# Patient Record
Sex: Female | Born: 1990 | Race: Black or African American | Hispanic: No | Marital: Single | Smoking: Former smoker
Health system: Southern US, Community
[De-identification: ages and names within clinical notes are randomized; demographics above are authoritative.]

## PROBLEM LIST (undated history)

## (undated) ENCOUNTER — Ambulatory Visit: Admission: EM | Payer: Medicaid Other | Source: Home / Self Care

## (undated) ENCOUNTER — Inpatient Hospital Stay (HOSPITAL_COMMUNITY): Payer: Self-pay

## (undated) DIAGNOSIS — R87619 Unspecified abnormal cytological findings in specimens from cervix uteri: Secondary | ICD-10-CM

## (undated) DIAGNOSIS — R7989 Other specified abnormal findings of blood chemistry: Secondary | ICD-10-CM

## (undated) DIAGNOSIS — F53 Postpartum depression: Secondary | ICD-10-CM

## (undated) DIAGNOSIS — D649 Anemia, unspecified: Secondary | ICD-10-CM

## (undated) DIAGNOSIS — Z8619 Personal history of other infectious and parasitic diseases: Secondary | ICD-10-CM

## (undated) DIAGNOSIS — T7840XA Allergy, unspecified, initial encounter: Secondary | ICD-10-CM

## (undated) DIAGNOSIS — O99345 Other mental disorders complicating the puerperium: Secondary | ICD-10-CM

## (undated) HISTORY — DX: Postpartum depression: F53.0

## (undated) HISTORY — DX: Anemia, unspecified: D64.9

## (undated) HISTORY — DX: Personal history of other infectious and parasitic diseases: Z86.19

## (undated) HISTORY — DX: Other mental disorders complicating the puerperium: O99.345

## (undated) HISTORY — DX: Unspecified abnormal cytological findings in specimens from cervix uteri: R87.619

## (undated) HISTORY — PX: APPENDECTOMY: SHX54

## (undated) HISTORY — PX: DILATION AND CURETTAGE OF UTERUS: SHX78

## (undated) HISTORY — DX: Allergy, unspecified, initial encounter: T78.40XA

## (undated) HISTORY — PX: OTHER SURGICAL HISTORY: SHX169

---

## 2011-06-05 NOTE — L&D Delivery Note (Signed)
Delivery Note At 10:15 PM a viable female was delivered via Vaginal, Spontaneous Delivery (Presentation: Right Occiput Posterior). Mild shoulder dystocia approx 30 seconds, relieved w mcroberts   APGAR: 8, 9; weight . Pending  Placenta status: Intact, Spontaneous.  Cord: 3 vessels with the following complications: None.  Cord pH: n/a  Anesthesia: Epidural  Episiotomy: None Lacerations: None Suture Repair: n/a Est. Blood Loss (mL): 250  Mom to postpartum.  Baby to nursery-stable. Pt plans to bottle feed, desires outpatient circumcision  Plans IUD for contraception   Shaiann Mcmanamon M 04/16/2012, 10:54 PM

## 2011-07-18 ENCOUNTER — Encounter (HOSPITAL_COMMUNITY): Payer: Self-pay

## 2011-07-18 ENCOUNTER — Emergency Department (HOSPITAL_COMMUNITY)
Admission: EM | Admit: 2011-07-18 | Discharge: 2011-07-19 | Disposition: A | Discharge status: Home / Self Care | Payer: Self-pay | Attending: Emergency Medicine | Admitting: Emergency Medicine

## 2011-07-18 DIAGNOSIS — R109 Unspecified abdominal pain: Secondary | ICD-10-CM | POA: Insufficient documentation

## 2011-07-18 DIAGNOSIS — R112 Nausea with vomiting, unspecified: Secondary | ICD-10-CM | POA: Insufficient documentation

## 2011-07-18 DIAGNOSIS — K529 Noninfective gastroenteritis and colitis, unspecified: Secondary | ICD-10-CM

## 2011-07-18 DIAGNOSIS — K5289 Other specified noninfective gastroenteritis and colitis: Secondary | ICD-10-CM | POA: Insufficient documentation

## 2011-07-18 LAB — DIFFERENTIAL
Basophils Absolute: 0 10*3/uL (ref 0.0–0.1)
Basophils Relative: 0 % (ref 0–1)
Eosinophils Absolute: 0.2 10*3/uL (ref 0.0–0.7)
Eosinophils Relative: 4 % (ref 0–5)
Lymphocytes Relative: 33 % (ref 12–46)
Lymphs Abs: 1.5 10*3/uL (ref 0.7–4.0)
Monocytes Absolute: 0.5 10*3/uL (ref 0.1–1.0)
Monocytes Relative: 11 % (ref 3–12)
Neutro Abs: 2.4 10*3/uL (ref 1.7–7.7)
Neutrophils Relative %: 52 % (ref 43–77)

## 2011-07-18 LAB — URINALYSIS, ROUTINE W REFLEX MICROSCOPIC
Ketones, ur: 15 mg/dL — AB
Specific Gravity, Urine: 1.029 (ref 1.005–1.030)
pH: 6 (ref 5.0–8.0)

## 2011-07-18 LAB — CBC
HCT: 33.3 % — ABNORMAL LOW (ref 36.0–46.0)
Hemoglobin: 11.4 g/dL — ABNORMAL LOW (ref 12.0–15.0)
MCH: 34.5 pg — ABNORMAL HIGH (ref 26.0–34.0)
MCHC: 34.2 g/dL (ref 30.0–36.0)
MCV: 100.9 fL — ABNORMAL HIGH (ref 78.0–100.0)
Platelets: 217 10*3/uL (ref 150–400)
RBC: 3.3 MIL/uL — ABNORMAL LOW (ref 3.87–5.11)
RDW: 11.4 % — ABNORMAL LOW (ref 11.5–15.5)
WBC: 4.6 10*3/uL (ref 4.0–10.5)

## 2011-07-18 LAB — LIPASE, BLOOD: Lipase: 38 U/L (ref 11–59)

## 2011-07-18 LAB — COMPREHENSIVE METABOLIC PANEL
ALT: 15 U/L (ref 0–35)
AST: 19 U/L (ref 0–37)
Albumin: 3.7 g/dL (ref 3.5–5.2)
Alkaline Phosphatase: 52 U/L (ref 39–117)
BUN: 9 mg/dL (ref 6–23)
CO2: 23 mEq/L (ref 19–32)
Calcium: 8.4 mg/dL (ref 8.4–10.5)
Chloride: 106 mEq/L (ref 96–112)
Creatinine, Ser: 0.59 mg/dL (ref 0.50–1.10)
GFR calc Af Amer: >90 mL/min (ref >90)
GFR calc non Af Amer: >90 mL/min (ref >90)
Glucose, Bld: 105 mg/dL — ABNORMAL HIGH (ref 70–99)
Potassium: 3.1 mEq/L — ABNORMAL LOW (ref 3.5–5.1)
Sodium: 136 mEq/L (ref 135–145)
Total Bilirubin: 0.5 mg/dL (ref 0.3–1.2)
Total Protein: 6.4 g/dL (ref 6.0–8.3)

## 2011-07-18 MED ORDER — PROMETHAZINE HCL 25 MG PO TABS
25.0000 mg | ORAL_TABLET | Freq: Four times a day (QID) | ORAL | Status: DC | PRN
Start: 2011-07-18 — End: 2011-07-25

## 2011-07-18 MED ORDER — POTASSIUM CHLORIDE CRYS ER 20 MEQ PO TBCR
40.0000 meq | EXTENDED_RELEASE_TABLET | Freq: Once | ORAL | Status: AC
  Administered 2011-07-18: 40 meq via ORAL
  Filled 2011-07-18: qty 2

## 2011-07-18 MED ORDER — FENTANYL CITRATE 0.05 MG/ML IJ SOLN
100.0000 ug | Freq: Once | INTRAMUSCULAR | Status: AC
  Administered 2011-07-18: 100 ug via INTRAVENOUS
  Filled 2011-07-18: qty 2

## 2011-07-18 MED ORDER — MORPHINE SULFATE 4 MG/ML IJ SOLN
4.0000 mg | Freq: Once | INTRAMUSCULAR | Status: AC
  Administered 2011-07-18: 4 mg via INTRAVENOUS
  Filled 2011-07-18: qty 1

## 2011-07-18 MED ORDER — SODIUM CHLORIDE 0.9 % IV BOLUS (SEPSIS)
2000.0000 mL | Freq: Once | INTRAVENOUS | Status: AC
  Administered 2011-07-18 (×2): 1000 mL via INTRAVENOUS

## 2011-07-18 MED ORDER — ONDANSETRON HCL 4 MG/2ML IJ SOLN
4.0000 mg | Freq: Once | INTRAMUSCULAR | Status: AC
  Administered 2011-07-18: 4 mg via INTRAVENOUS
  Filled 2011-07-18: qty 2

## 2011-07-18 NOTE — ED Provider Notes (Signed)
Complains of the gastric pain onset yesterday after eating fried chicken and french fries feels much improved since treatment in the emergency department on exam alert no distress abdomen nondistended nontender  Doug Sou, MD 07/18/11 2323

## 2011-07-18 NOTE — ED Provider Notes (Signed)
History     CSN: [id number]  Arrival date & time 07/18/11  4098   First MD Initiated Contact with Patient 07/18/11 2030      Chief Complaint  Patient presents with  . Abdominal Pain    (Consider location/radiation/quality/duration/timing/severity/associated sxs/prior treatment) HPI Patient presents emergency Dept. With abdominal pain and vomiting since last night at 9pm. She reports that she was working at [employer] when she started feeling sick. She states that she has vomited about 5 times since last night, and the last time she vomited was this morning. She comes to the ED because she is still feeling nauseous and having abdominal pain. She has had a loss of appetite since getting sick last night. She has been able to tolerate small amounts of fluids today. Patient denies fevers, chills, sweats, chest pain, shortness of breath, diarrhea.  Patient has no medical conditions and has no allergies. History reviewed. No pertinent past medical history.  Past Surgical History  Procedure Date  . Dilation and curettage of uterus     No family history on file.  History  Substance Use Topics  . Smoking status: Current Everyday Smoker -- 0.0 packs/day    Types: Cigarettes  . Smokeless tobacco: Not on file  . Alcohol Use: No    OB History    Grav Para Term Preterm Abortions TAB SAB Ect Mult Living                  Review of Systems All pertinent positives and negatives reviewed in the history of present illness.  Allergies  Review of patient's allergies indicates no known allergies.  Home Medications  No current outpatient prescriptions on file.  BP 97/63  Pulse 105  Temp(Src) 98.7 F (37.1 C) (Oral)  Resp 16  Ht 5\' 2"  (1.575 m)  SpO2 97%  LMP 06/26/2011  Physical Exam  Constitutional: She is oriented to person, place, and time. She appears well-developed and well-nourished. No distress.  HENT:  Head: Normocephalic and atraumatic.  Cardiovascular: Normal rate, regular  rhythm and normal heart sounds.   Pulmonary/Chest: Breath sounds normal. No respiratory distress. She has no wheezes. She has no rales. She exhibits no tenderness.  Abdominal: Soft. Bowel sounds are normal. She exhibits no distension and no mass. There is tenderness (tenderness over LUQ to deep palpation) in the left upper quadrant. There is no rebound and no guarding (guarding in upper quadrants and epigastrum).    Neurological: She is alert and oriented to person, place, and time.  Skin: Skin is warm and dry. She is not diaphoretic.    ED Course  Procedures (including critical care time)  Labs Reviewed  URINALYSIS, ROUTINE W REFLEX MICROSCOPIC - Abnormal; Notable for the following:    Color, Urine AMBER (*) BIOCHEMICALS MAY BE AFFECTED BY COLOR   Bilirubin Urine SMALL (*)    Ketones, ur 15 (*)    All other components within normal limits  POCT PREGNANCY, URINE    The patient has eaten a half of a salad. She states that she is feeling better and feels like she can go home. The patient is advised to return here as needed. The patient most likely has a viral GI illness. She is told to follow the BRAT diet.      MDM  MDM Reviewed: nursing note, vitals and previous chart Interpretation: labs            Carlyle Dolly, PA-C 07/18/11 2352

## 2011-07-18 NOTE — Discharge Instructions (Signed)
Return here as needed. Increase your fluids slowly. Your testing was normal here today.

## 2011-07-18 NOTE — ED Notes (Signed)
Pt presented to the ER with c/o abdominal pain, back pain onset yesterday with vomiting. Pt denies any fever nor diarrhea

## 2011-07-19 NOTE — ED Provider Notes (Signed)
Medical screening examination/treatment/procedure(s) were conducted as a shared visit with non-physician practitioner(s) and myself.  I personally evaluated the patient during the encounter  Vera Furniss, MD 07/19/11 0052 

## 2011-08-20 ENCOUNTER — Emergency Department (HOSPITAL_COMMUNITY)
Admission: EM | Admit: 2011-08-20 | Discharge: 2011-08-21 | Disposition: A | Discharge status: Home / Self Care | Payer: Self-pay | Attending: Emergency Medicine | Admitting: Emergency Medicine

## 2011-08-20 ENCOUNTER — Encounter (HOSPITAL_COMMUNITY): Payer: Self-pay | Admitting: Emergency Medicine

## 2011-08-20 DIAGNOSIS — F17200 Nicotine dependence, unspecified, uncomplicated: Secondary | ICD-10-CM | POA: Insufficient documentation

## 2011-08-20 DIAGNOSIS — Z331 Pregnant state, incidental: Secondary | ICD-10-CM | POA: Insufficient documentation

## 2011-08-20 DIAGNOSIS — R040 Epistaxis: Secondary | ICD-10-CM | POA: Insufficient documentation

## 2011-08-20 NOTE — ED Notes (Signed)
Pt to ED for eval of nose bleeds; pt reports that bleeds are intermittent X 2 days; currently no active bleeding, but pt reports that last time it was bleeding was 9pm and bright red blood;

## 2011-08-20 NOTE — ED Notes (Signed)
Pt stated that she has been intermittent nose bleeds (from both nostrils) x 3 days. No trauma to nose. Blood is bright red blood with no clots. She also desires to know if she is pregnant and if this has to do with her nose bleeds. Pt has not had her menstrual cycle for a month.  Will continue to monitor.

## 2011-08-21 LAB — POCT PREGNANCY, URINE: Preg Test, Ur: POSITIVE — AB

## 2011-08-21 MED ORDER — PRENATAL RX 60-1 MG PO TABS: 1.0000 | ORAL_TABLET | Freq: Every day | ORAL | Status: DC

## 2011-08-21 MED ORDER — PHENYLEPHRINE HCL 0.5 % NA SOLN
1.0000 [drp] | Freq: Once | NASAL | Status: AC
  Administered 2011-08-21: 1 [drp] via NASAL
  Filled 2011-08-21 (×2): qty 15

## 2011-08-21 NOTE — ED Notes (Signed)
Dr. Dierdre Highman made aware that pt is pregnant. Called pharmacy to make sure nasal solution is safe for fetus and they stated that it is. Will continue to monitor.

## 2011-08-21 NOTE — Discharge Instructions (Signed)
ABCs of Pregnancy A Antepartum care is very important. Be sure you see your doctor and get prenatal care as soon as you think you are pregnant. At this time, you will be tested for infection, genetic abnormalities and potential problems with you and the pregnancy. This is the time to discuss diet, exercise, work, medications, labor, pain medication during labor and the possibility of a cesarean delivery. Ask any questions that may concern you. It is important to see your doctor regularly throughout your pregnancy. Avoid exposure to toxic substances and chemicals - such as cleaning solvents, lead and mercury, some insecticides, and paint. Pregnant women should avoid exposure to paint fumes, and fumes that cause you to feel ill, dizzy or faint. When possible, it is a good idea to have a pre-pregnancy consultation with your caregiver to begin some important recommendations your caregiver suggests such as, taking folic acid, exercising, quitting smoking, avoiding alcoholic beverages, etc. B Breastfeeding is the healthiest choice for both you and your baby. It has many nutritional benefits for the baby and health benefits for the mother. It also creates a very tight and loving bond between the baby and mother. Talk to your doctor, your family and friends, and your employer about how you choose to feed your baby and how they can support you in your decision. Not all birth defects can be prevented, but a woman can take actions that may increase her chance of having a healthy baby. Many birth defects happen very early in pregnancy, sometimes before a woman even knows she is pregnant. Birth defects or abnormalities of any child in your or the father's family should be discussed with your caregiver. Get a good support bra as your breast size changes. Wear it especially when you exercise and when nursing.  C Celebrate the news of your pregnancy with the your spouse/father and family. Childbirth classes are helpful to  take for you and the spouse/father because it helps to understand what happens during the pregnancy, labor and delivery. Cesarean delivery should be discussed with your doctor so you are prepared for that possibility. The pros and cons of circumcision if it is a boy, should be discussed with your pediatrician. Cigarette smoking during pregnancy can result in low birth weight babies. It has been associated with infertility, miscarriages, tubal pregnancies, infant death (mortality) and poor health (morbidity) in childhood. Additionally, cigarette smoking may cause long-term learning disabilities. If you smoke, you should try to quit before getting pregnant and not smoke during the pregnancy. Secondary smoke may also harm a mother and her developing baby. It is a good idea to ask people to stop smoking around you during your pregnancy and after the baby is born. Extra calcium is necessary when you are pregnant and is found in your prenatal vitamin, in dairy products, green leafy vegetables and in calcium supplements. D A healthy diet according to your current weight and height, along with vitamins and mineral supplements should be discussed with your caregiver. Domestic abuse or violence should be made known to your doctor right away to get the situation corrected. Drink more water when you exercise to keep hydrated. Discomfort of your back and legs usually develops and progresses from the middle of the second trimester through to delivery of the baby. This is because of the enlarging baby and uterus, which may also affect your balance. Do not take illegal drugs. Illegal drugs can seriously harm the baby and you. Drink extra fluids (water is best) throughout pregnancy to help   your body keep up with the increases in your blood volume. Drink at least 6 to 8 glasses of water, fruit juice, or milk each day. A good way to know you are drinking enough fluid is when your urine looks almost like clear water or is very light  yellow.  E Eat healthy to get the nutrients you and your unborn baby need. Your meals should include the five basic food groups. Exercise (30 minutes of light to moderate exercise a day) is important and encouraged during pregnancy, if there are no medical problems or problems with the pregnancy. Exercise that causes discomfort or dizziness should be stopped and reported to your caregiver. Emotions during pregnancy can change from being ecstatic to depression and should be understood by you, your partner and your family. F Fetal screening with ultrasound, amniocentesis and monitoring during pregnancy and labor is common and sometimes necessary. Take 400 micrograms of folic acid daily both before, when possible, and during the first few months of pregnancy to reduce the risk of birth defects of the brain and spine. All women who could possibly become pregnant should take a vitamin with folic acid, every day. It is also important to eat a healthy diet with fortified foods (enriched grain products, including cereals, rice, breads, and pastas) and foods with natural sources of folate (orange juice, green leafy vegetables, beans, peanuts, broccoli, asparagus, peas, and lentils). The father should be involved with all aspects of the pregnancy including, the prenatal care, childbirth classes, labor, delivery, and postpartum time. Fathers may also have emotional concerns about being a father, financial needs, and raising a family. G Genetic testing should be done appropriately. It is important to know your family and the father's history. If there have been problems with pregnancies or birth defects in your family, report these to your doctor. Also, genetic counselors can talk with you about the information you might need in making decisions about having a family. You can call a major medical center in your area for help in finding a board-certified genetic counselor. Genetic testing and counseling should be done  before pregnancy when possible, especially if there is a history of problems in the mother's or father's family. Certain ethnic backgrounds are more at risk for genetic defects. H Get familiar with the hospital where you will be having your baby. Get to know how long it takes to get there, the labor and delivery area, and the hospital procedures. Be sure your medical insurance is accepted there. Get your home ready for the baby including, clothes, the baby's room (when possible), furniture and car seat. Hand washing is important throughout the day, especially after handling raw meat and poultry, changing the baby's diaper or using the bathroom. This can help prevent the spread of many bacteria and viruses that cause infection. Your hair may become dry and thinner, but will return to normal a few weeks after the baby is born. Heartburn is a common problem that can be treated by taking antacids recommended by your caregiver, eating smaller meals 5 or 6 times a day, not drinking liquids when eating, drinking between meals and raising the head of your bed 2 to 3 inches. I Insurance to cover you, the baby, doctor and hospital should be reviewed so that you will be prepared to pay any costs not covered by your insurance plan. If you do not have medical insurance, there are usually clinics and services available for you in your community. Take 30 milligrams of iron during   your pregnancy as prescribed by your doctor to reduce the risk of low red blood cells (anemia) later in pregnancy. All women of childbearing age should eat a diet rich in iron. J There should be a joint effort for the mother, father and any other children to adapt to the pregnancy financially, emotionally, and psychologically during the pregnancy. Join a support group for moms-to-be. Or, join a class on parenting or childbirth. Have the family participate when possible. K Know your limits. Let your caregiver know if you experience any of the  following:   Pain of any kind.   Strong cramps.   You develop a lot of weight in a short period of time (5 pounds in 3 to 5 days).   Vaginal bleeding, leaking of amniotic fluid.   Headache, vision problems.   Dizziness, fainting, shortness of breath.   Chest pain.   Fever of 102 F (38.9 C) or higher.   Gush of clear fluid from your vagina.   Painful urination.   Domestic violence.   Irregular heartbeat (palpitations).   Rapid beating of the heart (tachycardia).   Constant feeling sick to your stomach (nauseous) and vomiting.   Trouble walking, fluid retention (edema).   Muscle weakness.   If your baby has decreased activity.   Persistent diarrhea.   Abnormal vaginal discharge.   Uterine contractions at 20-minute intervals.   Back pain that travels down your leg.  L Learn and practice that what you eat and drink should be in moderation and healthy for you and your baby. Legal drugs such as alcohol and caffeine are important issues for pregnant women. There is no safe amount of alcohol a woman can drink while pregnant. Fetal alcohol syndrome, a disorder characterized by growth retardation, facial abnormalities, and central nervous system dysfunction, is caused by a woman's use of alcohol during pregnancy. Caffeine, found in tea, coffee, soft drinks and chocolate, should also be limited. Be sure to read labels when trying to cut down on caffeine during pregnancy. More than 200 foods, beverages, and over-the-counter medications contain caffeine and have a high salt content! There are coffees and teas that do not contain caffeine. M Medical conditions such as diabetes, epilepsy, and high blood pressure should be treated and kept under control before pregnancy when possible, but especially during pregnancy. Ask your caregiver about any medications that may need to be changed or adjusted during pregnancy. If you are currently taking any medications, ask your caregiver if it  is safe to take them while you are pregnant or before getting pregnant when possible. Also, be sure to discuss any herbs or vitamins you are taking. They are medicines, too! Discuss with your doctor all medications, prescribed and over-the-counter, that you are taking. During your prenatal visit, discuss the medications your doctor may give you during labor and delivery. N Never be afraid to ask your doctor or caregiver questions about your health, the progress of the pregnancy, family problems, stressful situations, and recommendation for a pediatrician, if you do not have one. It is better to take all precautions and discuss any questions or concerns you may have during your office visits. It is a good idea to write down your questions before you visit the doctor. O Over-the-counter cough and cold remedies may contain alcohol or other ingredients that should be avoided during pregnancy. Ask your caregiver about prescription, herbs or over-the-counter medications that you are taking or may consider taking while pregnant.  P Physical activity during pregnancy can   benefit both you and your baby by lessening discomfort and fatigue, providing a sense of well-being, and increasing the likelihood of early recovery after delivery. Light to moderate exercise during pregnancy strengthens the belly (abdominal) and back muscles. This helps improve posture. Practicing yoga, walking, swimming, and cycling on a stationary bicycle are usually safe exercises for pregnant women. Avoid scuba diving, exercise at high altitudes (over 3000 feet), skiing, horseback riding, contact sports, etc. Always check with your doctor before beginning any kind of exercise, especially during pregnancy and especially if you did not exercise before getting pregnant. Q Queasiness, stomach upset and morning sickness are common during pregnancy. Eating a couple of crackers or dry toast before getting out of bed. Foods that you normally love may  make you feel sick to your stomach. You may need to substitute other nutritious foods. Eating 5 or 6 small meals a day instead of 3 large ones may make you feel better. Do not drink with your meals, drink between meals. Questions that you have should be written down and asked during your prenatal visits. R Read about and make plans to baby-proof your home. There are important tips for making your home a safer environment for your baby. Review the tips and make your home safer for you and your baby. Read food labels regarding calories, salt and fat content in the food. S Saunas, hot tubs, and steam rooms should be avoided while you are pregnant. Excessive high heat may be harmful during your pregnancy. Your caregiver will screen and examine you for sexually transmitted diseases and genetic disorders during your prenatal visits. Learn the signs of labor. Sexual relations while pregnant is safe unless there is a medical or pregnancy problem and your caregiver advises against it. T Traveling long distances should be avoided especially in the third trimester of your pregnancy. If you do have to travel out of state, be sure to take a copy of your medical records and medical insurance plan with you. You should not travel long distances without seeing your doctor first. Most airlines will not allow you to travel after 36 weeks of pregnancy. Toxoplasmosis is an infection caused by a parasite that can seriously harm an unborn baby. Avoid eating undercooked meat and handling cat litter. Be sure to wear gloves when gardening. Tingling of the hands and fingers is not unusual and is due to fluid retention. This will go away after the baby is born. U Womb (uterus) size increases during the first trimester. Your kidneys will begin to function more efficiently. This may cause you to feel the need to urinate more often. You may also leak urine when sneezing, coughing or laughing. This is due to the growing uterus pressing  against your bladder, which lies directly in front of and slightly under the uterus during the first few months of pregnancy. If you experience burning along with frequency of urination or bloody urine, be sure to tell your doctor. The size of your uterus in the third trimester may cause a problem with your balance. It is advisable to maintain good posture and avoid wearing high heels during this time. An ultrasound of your baby may be necessary during your pregnancy and is safe for you and your baby. V Vaccinations are an important concern for pregnant women. Get needed vaccines before pregnancy. Center for Disease Control (www.cdc.gov) has clear guidelines for the use of vaccines during pregnancy. Review the list, be sure to discuss it with your doctor. Prenatal vitamins are helpful   and healthy for you and the baby. Do not take extra vitamins except what is recommended. Taking too much of certain vitamins can cause overdose problems. Continuous vomiting should be reported to your caregiver. Varicose veins may appear especially if there is a family history of varicose veins. They should subside after the delivery of the baby. Support hose helps if there is leg discomfort. W Being overweight or underweight during pregnancy may cause problems. Try to get within 15 pounds of your ideal weight before pregnancy. Remember, pregnancy is not a time to be dieting! Do not stop eating or start skipping meals as your weight increases. Both you and your baby need the calories and nutrition you receive from a healthy diet. Be sure to consult with your doctor about your diet. There is a formula and diet plan available depending on whether you are overweight or underweight. Your caregiver or nutritionist can help and advise you if necessary. X Avoid X-rays. If you must have dental work or diagnostic tests, tell your dentist or physician that you are pregnant so that extra care can be taken. X-rays should only be taken when  the risks of not taking them outweigh the risk of taking them. If needed, only the minimum amount of radiation should be used. When X-rays are necessary, protective lead shields should be used to cover areas of the body that are not being X-rayed. Y Your baby loves you. Breastfeeding your baby creates a loving and very close bond between the two of you. Give your baby a healthy environment to live in while you are pregnant. Infants and children require constant care and guidance. Their health and safety should be carefully watched at all times. After the baby is born, rest or take a nap when the baby is sleeping. Z Get your ZZZs. Be sure to get plenty of rest. Resting on your side as often as possible, especially on your left side is advised. It provides the best circulation to your baby and helps reduce swelling. Try taking a nap for 30 to 45 minutes in the afternoon when possible. After the baby is born rest or take a nap when the baby is sleeping. Try elevating your feet for that amount of time when possible. It helps the circulation in your legs and helps reduce swelling.  Most information courtesy of the CDC. Document Released: 05/21/2005 Document Revised: 05/10/2011 Document Reviewed: 02/02/2009 [employer] Patient Information 2012 [employer]. 

## 2011-08-21 NOTE — ED Provider Notes (Signed)
History     CSN: [id number]  Arrival date & time 08/20/11  2209   First MD Initiated Contact with Patient 08/20/11 2344      Chief Complaint  Patient presents with  . Epistaxis    (Consider location/radiation/quality/duration/timing/severity/associated sxs/prior treatment) The history is provided by the patient.   intermittent nosebleeds for the last few days. Patient denies any trauma, any foreign bodies, any drug use or any known reason for why she may getting nosebleeds. She denies any recent illness. She denies any increased use of heater at home or increased chance of dryness in her house. No history of nosebleeds. No known bleeding disorders. Patient is requesting a pregnancy test. Last menstrual period unknown. Patient is sexually active. She denies any abdominal pain or vaginal bleeding. Epistaxis is mild and has not had any bleeding since 9 PM tonight. No known aggravating factors. Bleeding stops when she pinches her nose. No nausea or vomiting. She is unable to say which side she was bleeding from. Patient does smoke tobacco.  History reviewed. No pertinent past medical history.  Past Surgical History  Procedure Date  . Dilation and curettage of uterus     History reviewed. No pertinent family history.  History  Substance Use Topics  . Smoking status: Current Everyday Smoker -- 0.5 packs/day    Types: Cigarettes  . Smokeless tobacco: Not on file  . Alcohol Use: No    OB History    Grav Para Term Preterm Abortions TAB SAB Ect Mult Living                  Review of Systems  Constitutional: Negative for fever and chills.  HENT: Positive for nosebleeds. Negative for neck pain and neck stiffness.   Eyes: Negative for pain.  Respiratory: Negative for shortness of breath.   Cardiovascular: Negative for chest pain.  Gastrointestinal: Negative for abdominal pain.  Genitourinary: Negative for dysuria.  Musculoskeletal: Negative for back pain.  Skin: Negative for  rash.  Neurological: Negative for headaches.  All other systems reviewed and are negative.    Allergies  Review of patient's allergies indicates no known allergies.  Home Medications  No current outpatient prescriptions on file.  BP 100/58  Pulse 102  Temp(Src) 98.8 F (37.1 C) (Oral)  Resp 17  SpO2 100%  Physical Exam  Constitutional: She is oriented to person, place, and time. She appears well-developed and well-nourished.  HENT:  Head: Normocephalic and atraumatic.  Mouth/Throat: Oropharynx is clear and moist. No oropharyngeal exudate.       Mild irritation bilateral nasal mucosa. No evidence of acute bleed site  Eyes: Conjunctivae and EOM are normal. Pupils are equal, round, and reactive to light.  Neck: Trachea normal. Neck supple. No thyromegaly present.  Cardiovascular: Normal rate, regular rhythm, S1 normal, S2 normal and normal pulses.     No systolic murmur is present   No diastolic murmur is present  Pulses:      Radial pulses are 2+ on the right side, and 2+ on the left side.  Pulmonary/Chest: Effort normal and breath sounds normal. She has no wheezes. She has no rhonchi. She has no rales. She exhibits no tenderness.  Abdominal: Soft. Normal appearance and bowel sounds are normal. There is no tenderness. There is no CVA tenderness and negative Murphy's sign.  Musculoskeletal:       BLE:s Calves nontender, no cords or erythema, negative Homans sign  Neurological: She is alert and oriented to person, place, and time.  She has normal strength. No cranial nerve deficit or sensory deficit. GCS eye subscore is 4. GCS verbal subscore is 5. GCS motor subscore is 6.  Skin: Skin is warm and dry. No rash noted. She is not diaphoretic.  Psychiatric: Her speech is normal.       Cooperative and appropriate    ED Course  Procedures (including critical care time)  Labs Reviewed  POCT PREGNANCY, URINE - Abnormal; Notable for the following:    Preg Test, Ur POSITIVE (*)     All other components within normal limits   Neo-Synephrine applied bilateral layers. Recheck unchanged. No bleeding in ED   MDM   Epistaxis resolved.  Positive pregnancy test with no indication for emergent ultrasound - no symptoms of ectopic. Patient given referrals for outpatient GYN followup. Prescription for prenatal vitamins provided. Reliable historian and verbalizes understanding epistaxis and pregnancy precautions.        Sunnie Nielsen, MD 08/21/11 [phone]

## 2011-09-25 ENCOUNTER — Emergency Department (HOSPITAL_COMMUNITY)
Admission: EM | Admit: 2011-09-25 | Discharge: 2011-09-26 | Disposition: A | Discharge status: Home / Self Care | Payer: Self-pay | Attending: Emergency Medicine | Admitting: Emergency Medicine

## 2011-09-25 ENCOUNTER — Encounter (HOSPITAL_COMMUNITY): Payer: Self-pay | Admitting: *Deleted

## 2011-09-25 DIAGNOSIS — K2970 Gastritis, unspecified, without bleeding: Secondary | ICD-10-CM | POA: Insufficient documentation

## 2011-09-25 DIAGNOSIS — R109 Unspecified abdominal pain: Secondary | ICD-10-CM | POA: Insufficient documentation

## 2011-09-25 DIAGNOSIS — K2990 Gastroduodenitis, unspecified, without bleeding: Secondary | ICD-10-CM | POA: Insufficient documentation

## 2011-09-25 DIAGNOSIS — M549 Dorsalgia, unspecified: Secondary | ICD-10-CM | POA: Insufficient documentation

## 2011-09-25 DIAGNOSIS — R10819 Abdominal tenderness, unspecified site: Secondary | ICD-10-CM | POA: Insufficient documentation

## 2011-09-25 DIAGNOSIS — F17200 Nicotine dependence, unspecified, uncomplicated: Secondary | ICD-10-CM | POA: Insufficient documentation

## 2011-09-25 DIAGNOSIS — N852 Hypertrophy of uterus: Secondary | ICD-10-CM | POA: Insufficient documentation

## 2011-09-25 DIAGNOSIS — O99891 Other specified diseases and conditions complicating pregnancy: Secondary | ICD-10-CM | POA: Insufficient documentation

## 2011-09-25 NOTE — ED Notes (Signed)
Pt is 4 months pregnant.  Pt states that her due date is in November.  LMP is unknown.  Pt is here for abdominal pain in right side radiating into her left hip.  No fluid or blood from her vagina.  Pt not feeling baby yet (no change).  No fall or trauma

## 2011-09-25 NOTE — ED Notes (Signed)
Abdominal pain.  Pt found out she was pregnant on 3/18 and was told that she was 8 weeks at that time

## 2011-09-26 LAB — DIFFERENTIAL
Basophils Absolute: 0 10*3/uL (ref 0.0–0.1)
Basophils Relative: 0 % (ref 0–1)
Eosinophils Absolute: 0.2 10*3/uL (ref 0.0–0.7)
Monocytes Absolute: 0.9 10*3/uL (ref 0.1–1.0)
Monocytes Relative: 10 % (ref 3–12)

## 2011-09-26 LAB — CBC
HCT: 31.4 % — ABNORMAL LOW (ref 36.0–46.0)
Hemoglobin: 11 g/dL — ABNORMAL LOW (ref 12.0–15.0)
MCH: 35.3 pg — ABNORMAL HIGH (ref 26.0–34.0)
MCHC: 35 g/dL (ref 30.0–36.0)
RDW: 11.8 % (ref 11.5–15.5)

## 2011-09-26 LAB — WET PREP, GENITAL
Trich, Wet Prep: NONE SEEN
Yeast Wet Prep HPF POC: NONE SEEN

## 2011-09-26 LAB — COMPREHENSIVE METABOLIC PANEL
AST: 14 U/L (ref 0–37)
Albumin: 4 g/dL (ref 3.5–5.2)
BUN: 8 mg/dL (ref 6–23)
Calcium: 9.1 mg/dL (ref 8.4–10.5)
Creatinine, Ser: 0.53 mg/dL (ref 0.50–1.10)
Total Protein: 6.9 g/dL (ref 6.0–8.3)

## 2011-09-26 LAB — URINALYSIS, ROUTINE W REFLEX MICROSCOPIC
Bilirubin Urine: NEGATIVE
Ketones, ur: NEGATIVE mg/dL
Nitrite: NEGATIVE
Protein, ur: NEGATIVE mg/dL
pH: 6.5 (ref 5.0–8.0)

## 2011-09-26 LAB — URINE MICROSCOPIC-ADD ON

## 2011-09-26 LAB — LIPASE, BLOOD: Lipase: 21 U/L (ref 11–59)

## 2011-09-26 MED ORDER — MORPHINE SULFATE 4 MG/ML IJ SOLN
4.0000 mg | Freq: Once | INTRAMUSCULAR | Status: AC
  Administered 2011-09-26: 4 mg via INTRAVENOUS
  Filled 2011-09-26: qty 1

## 2011-09-26 MED ORDER — ONDANSETRON HCL 4 MG/2ML IJ SOLN
4.0000 mg | Freq: Once | INTRAMUSCULAR | Status: AC
  Administered 2011-09-26: 4 mg via INTRAVENOUS
  Filled 2011-09-26: qty 2

## 2011-09-26 MED ORDER — SODIUM CHLORIDE 0.9 % IV BOLUS (SEPSIS)
1000.0000 mL | Freq: Once | INTRAVENOUS | Status: AC
  Administered 2011-09-26: 1000 mL via INTRAVENOUS

## 2011-09-26 NOTE — ED Provider Notes (Signed)
History     CSN: [id number]  Arrival date & time 09/25/11  2301   First MD Initiated Contact with Patient 09/26/11 0032      Chief Complaint  Patient presents with  . Abdominal Pain    pregnant    HPI  History provided by the patient. Patient is a 21 year old female who is G2 P1 currently estimates being 3-4 months pregnant who presents with complaints of acute onset left abdomen and side pains yesterday afternoon around 10 AM. Pain has been constant and unchanged. Pain seems worse with lying on back and in certain positions at times. She denies any other aggravating or alleviating factors. She denies any vaginal bleeding or vaginal discharge. She denies any fever, chills, sweats. No nausea no vomiting. She denies any dysuria, hematuria urinary frequency. She does have upcoming appointment with OB/GYN she is unsure who she will be assigned to. Patient does report having an ultrasound performed at the pregnancy care Center on [facility] on April 4. She reports she was told that she had a normal pregnancy to this point.    History reviewed. No pertinent past medical history.  Past Surgical History  Procedure Date  . Dilation and curettage of uterus     No family history on file.  History  Substance Use Topics  . Smoking status: Current Everyday Smoker -- 0.5 packs/day    Types: Cigarettes  . Smokeless tobacco: Not on file  . Alcohol Use: No    OB History    Grav Para Term Preterm Abortions TAB SAB Ect Mult Living   1               Review of Systems  Constitutional: Negative for fever and chills.  Respiratory: Negative for cough and shortness of breath.   Cardiovascular: Negative for chest pain.  Gastrointestinal: Positive for abdominal pain. Negative for nausea, vomiting, diarrhea and constipation.  Genitourinary: Negative for dysuria, frequency, hematuria, flank pain, vaginal bleeding and vaginal discharge.  Musculoskeletal: Positive for back pain.  Skin:  Negative for rash.    Allergies  Review of patient's allergies indicates no known allergies.  Home Medications   Current Outpatient Rx  Name Route Sig Dispense Refill  . FLUTICASONE PROPIONATE 50 MCG/ACT NA SUSP Nasal Place 2 sprays into the nose daily. Nasal congestion    . PRENATAL RX 60-1 MG PO TABS Oral Take 1 tablet by mouth daily. 30 tablet 0    BP 104/64  Pulse 109  Temp(Src) 98.3 F (36.8 C) (Oral)  Resp 16  SpO2 100%  LMP 06/29/2011  Physical Exam  Nursing note and vitals reviewed. Constitutional: She is oriented to person, place, and time. She appears well-developed and well-nourished. No distress.  HENT:  Head: Normocephalic and atraumatic.  Cardiovascular: Normal rate and regular rhythm.   Pulmonary/Chest: Effort normal and breath sounds normal. No respiratory distress. She has no wheezes. She has no rales.  Abdominal: Soft. There is tenderness in the epigastric area and left upper quadrant. There is no rebound, no guarding, no CVA tenderness, no tenderness at McBurney's point and negative Murphy's sign.  Genitourinary: Uterus is enlarged. Uterus is not tender. Cervix exhibits no motion tenderness, no discharge and no friability. Right adnexum displays no mass, no tenderness and no fullness. Left adnexum displays no mass, no tenderness and no fullness.       Cervix closed.  Chaperone was present.  No bleeding.  Uterus enlarged consistent with early pregnancy.    Neurological: She is alert  and oriented to person, place, and time.  Skin: Skin is warm and dry. No rash noted.  Psychiatric: She has a normal mood and affect. Her behavior is normal.    ED Course  Procedures   Results for orders placed during the hospital encounter of 09/25/11  CBC      Component Value Range   WBC 8.4  4.0 - 10.5 (K/uL)   RBC 3.12 (*) 3.87 - 5.11 (MIL/uL)   Hemoglobin 11.0 (*) 12.0 - 15.0 (g/dL)   HCT 03.4 (*) 74.2 - 46.0 (%)   MCV 100.6 (*) 78.0 - 100.0 (fL)   MCH 35.3 (*) 26.0 -  34.0 (pg)   MCHC 35.0  30.0 - 36.0 (g/dL)   RDW 59.5  63.8 - 75.6 (%)   Platelets 249  150 - 400 (K/uL)  DIFFERENTIAL      Component Value Range   Neutrophils Relative 56  43 - 77 (%)   Neutro Abs 4.7  1.7 - 7.7 (K/uL)   Lymphocytes Relative 31  12 - 46 (%)   Lymphs Abs 2.6  0.7 - 4.0 (K/uL)   Monocytes Relative 10  3 - 12 (%)   Monocytes Absolute 0.9  0.1 - 1.0 (K/uL)   Eosinophils Relative 3  0 - 5 (%)   Eosinophils Absolute 0.2  0.0 - 0.7 (K/uL)   Basophils Relative 0  0 - 1 (%)   Basophils Absolute 0.0  0.0 - 0.1 (K/uL)  COMPREHENSIVE METABOLIC PANEL      Component Value Range   Sodium 133 (*) 135 - 145 (mEq/L)   Potassium 4.0  3.5 - 5.1 (mEq/L)   Chloride 102  96 - 112 (mEq/L)   CO2 21  19 - 32 (mEq/L)   Glucose, Bld 80  70 - 99 (mg/dL)   BUN 8  6 - 23 (mg/dL)   Creatinine, Ser 4.33  0.50 - 1.10 (mg/dL)   Calcium 9.1  8.4 - 29.5 (mg/dL)   Total Protein 6.9  6.0 - 8.3 (g/dL)   Albumin 4.0  3.5 - 5.2 (g/dL)   AST 14  0 - 37 (U/L)   ALT 10  0 - 35 (U/L)   Alkaline Phosphatase 39  39 - 117 (U/L)   Total Bilirubin 0.4  0.3 - 1.2 (mg/dL)   GFR calc non Af Amer >90  >90 (mL/min)   GFR calc Af Amer >90  >90 (mL/min)  LIPASE, BLOOD      Component Value Range   Lipase 21  11 - 59 (U/L)  URINALYSIS, ROUTINE W REFLEX MICROSCOPIC      Component Value Range   Color, Urine YELLOW  YELLOW    APPearance CLEAR  CLEAR    Specific Gravity, Urine 1.021  1.005 - 1.030    pH 6.5  5.0 - 8.0    Glucose, UA NEGATIVE  NEGATIVE (mg/dL)   Hgb urine dipstick NEGATIVE  NEGATIVE    Bilirubin Urine NEGATIVE  NEGATIVE    Ketones, ur NEGATIVE  NEGATIVE (mg/dL)   Protein, ur NEGATIVE  NEGATIVE (mg/dL)   Urobilinogen, UA 1.0  0.0 - 1.0 (mg/dL)   Nitrite NEGATIVE  NEGATIVE    Leukocytes, UA SMALL (*) NEGATIVE   URINE MICROSCOPIC-ADD ON      Component Value Range   Squamous Epithelial / LPF FEW (*) RARE    WBC, UA 3-6  <3 (WBC/hpf)   Bacteria, UA FEW (*) RARE         1. Abdominal pain in  pregnancy   2. Gastritis       MDM  1:25 AM patient seen and evaluated. Patient no acute distress.   3 a.m. patient feeling much better after medications. No significant pains at this time.  Pt with normal fetal heart tones.       Angus Seller, [place] 09/26/11 [phone]

## 2011-09-26 NOTE — ED Notes (Signed)
Pt c/o pain in left mid abd. Radiating into back onset approx 10pm tonight.  Fetal heart rate 156 with doppler at mid suprapubic area.

## 2011-09-26 NOTE — Discharge Instructions (Signed)
Please followup with your OB/GYN specialist as planned. Eat a plain simple diet with plenty of fluids. Continue your prenatal vitamins. Use Tylenol for pain. Return to the emergency room or women's hospital emergency room for any increased pain, fever, chills, persistent nausea vomiting.   Abdominal Pain During Pregnancy Abdominal discomfort is common in pregnancy. Most of the time, it does not cause harm. There are many causes of abdominal pain. Some causes are more serious than others. Some of the causes of abdominal pain in pregnancy are easily diagnosed. Occasionally, the diagnosis takes time to understand. Other times, the cause is not determined. Abdominal pain can be a sign that something is very wrong with the pregnancy, or the pain may have nothing to do with the pregnancy at all. For this reason, always tell your caregiver if you have any abdominal discomfort. CAUSES Common and harmless causes of abdominal pain include:  Constipation.   Excess gas and bloating.   Round ligament pain. This is pain that is felt in the folds of the groin.   The position the baby or placenta is in.   Baby kicks.   Braxton-Hicks contractions. These are mild contractions that do not cause cervical dilation.  Serious causes of abdominal pain include:  Ectopic pregnancy. This happens when a fertilized egg implants outside of the uterus.   Miscarriage.   Preterm labor. This is when labor starts at less than 37 weeks of pregnancy.   Placental abruption. This is when the placenta partially or completely separates from the uterus.   Preeclampsia. This is often associated with high blood pressure and has been referred to as "toxemia in pregnancy."   Uterine or amniotic fluid infections.  Causes unrelated to pregnancy include:  Urinary tract infection.   Gallbladder stones or inflammation.   Hepatitis or other liver illness.   Intestinal problems, stomach flu, food poisoning, or ulcer.    Appendicitis.   Kidney (renal) stones.   Kidney infection (pylonephritis).  HOME CARE INSTRUCTIONS  For mild pain:  Do not have sexual intercourse or put anything in your vagina until your symptoms go away completely.   Get plenty of rest until your pain improves. If your pain does not improve in 1 hour, call your caregiver.   Drink clear fluids if you feel nauseous. Avoid solid food as long as you are uncomfortable or nauseous.   Only take medicine as directed by your caregiver.   Keep all follow-up appointments with your caregiver.  SEEK IMMEDIATE MEDICAL CARE IF:  You are bleeding, leaking fluid, or passing tissue from the vagina.   You have increasing pain or cramping.   You have persistent vomiting.   You have painful or bloody urination.   You have a fever.   You notice a decrease in your baby's movements.   You have extreme weakness or feel faint.   You have shortness of breath, with or without abdominal pain.   You develop a severe headache with abdominal pain.   You have abnormal vaginal discharge with abdominal pain.   You have persistent diarrhea.   You have abdominal pain that continues even after rest, or gets worse.  MAKE SURE YOU:   Understand these instructions.   Will watch your condition.   Will get help right away if you are not doing well or get worse.  Document Released: 05/21/2005 Document Revised: 05/10/2011 Document Reviewed: 12/15/2010 [facility] Patient Information 2012 [place], [place].    Gastritis Gastritis is an inflammation (the body's way of  reacting to injury and/or infection) of the stomach. It is often caused by viral or bacterial (germ) infections. It can also be caused by chemicals (including alcohol) and medications. This illness may be associated with generalized malaise (feeling tired, not well), cramps, and fever. The illness may last 2 to 7 days. If symptoms of gastritis continue, gastroscopy (looking into the stomach  with a telescope-like instrument), biopsy (taking tissue samples), and/or blood tests may be necessary to determine the cause. Antibiotics will not affect the illness unless there is a bacterial infection present. One common bacterial cause of gastritis is an organism known as H. Pylori. This can be treated with antibiotics. Other forms of gastritis are caused by too much acid in the stomach. They can be treated with medications such as H2 blockers and antacids. Home treatment is usually all that is needed. Young children will quickly become dehydrated (loss of body fluids) if vomiting and diarrhea are both present. Medications may be given to control nausea. Medications are usually not given for diarrhea unless especially bothersome. Some medications slow the removal of the virus from the gastrointestinal tract. This slows down the healing process. HOME CARE INSTRUCTIONS Home care instructions for nausea and vomiting:  For adults: drink small amounts of fluids often. Drink at least 2 quarts a day. Take sips frequently. Do not drink large amounts of fluid at one time. This may worsen the nausea.   Only take over-the-counter or prescription medicines for pain, discomfort, or fever as directed by your caregiver.   Drink clear liquids only. Those are anything you can see through such as water, broth, or soft drinks.   Once you are keeping clear liquids down, you may start full liquids, soups, juices, and ice cream or sherbet. Slowly add bland (plain, not spicy) foods to your diet.  Home care instructions for diarrhea:  Diarrhea can be caused by bacterial infections or a virus. Your condition should improve with time, rest, fluids, and/or anti-diarrheal medication.   Until your diarrhea is under control, you should drink clear liquids often in small amounts. Clear liquids include: water, broth, jell-o water and weak tea.  Avoid:  Milk.   Fruits.   Tobacco.   Alcohol.   Extremely hot or cold  fluids.   Too much intake of anything at one time.  When your diarrhea stops you may add the following foods, which help the stool to become more formed:  Rice.   Bananas.   Apples without skin.   Dry toast.  Once these foods are tolerated you may add low-fat yogurt and low-fat cottage cheese. They will help to restore the normal bacterial balance in your bowel. Wash your hands well to avoid spreading bacteria (germ) or virus. SEEK IMMEDIATE MEDICAL CARE IF:   You are unable to keep fluids down.   Vomiting or diarrhea become persistent (constant).   Abdominal pain develops, increases, or localizes. (Right sided pain can be appendicitis. Left sided pain in adults can be diverticulitis.)   You develop a fever (an oral temperature above 102 F (38.9 C)).   Diarrhea becomes excessive or contains blood or mucus.   You have excessive weakness, dizziness, fainting or extreme thirst.   You are not improving or you are getting worse.   You have any other questions or concerns.  Document Released: 05/15/2001 Document Revised: 05/10/2011 Document Reviewed: 05/21/2005 [facility] Patient Information 2012 [place], [place].    RESOURCE GUIDE  Dental Problems  Patients with Medicaid: [facility]  [facility] [address]                                           [phone] [address] [employer] [address] Phone:  [phone]                                                  Phone:  [phone]  If unable to pay or uninsured, contact:  [facility] or [facility]. to become qualified for the adult dental clinic.  Chronic Pain Problems Contact [facility]  [phone] Patients need to be referred by their primary care doctor.  Insufficient Money for Medicine Contact United Way:  call "211" or [facility] [phone].  No Primary Care Doctor Call [facility]  [phone] Other agencies that provide inexpensive medical care     [facility]  [phone]    [facility]  [phone]    [facility]  [phone]    [facility]  [phone]    [facility]  [phone]    [facility]  [phone]  Psychological Services [facility] Behavioral Health  [phone] [facility] Services  [phone] [facility]   [phone] (emergency services (339)032-7296)  Substance Abuse Resources [facility]  7263240833 [facility] 605-644-8776 [facility] [phone])[phone] Floydene Flock 8161524371 Residential & Outpatient Substance Abuse Program  ([phone]  Abuse/Neglect [facility] Child Abuse Hotline 559-041-6189 [facility] Child Abuse Hotline [phone] (After Hours)  Emergency Shelter [facility] [phone]  Maternity Homes [facility] [phone])[phone] [facility] (727)[phone]  MRSA Hotline #:   9498519773    [facility] Resources  [facility]     United [employer]                          [facility] Dept. [address] [place]                       [address]      [address] [place] [address]  Blondell Reveal Phone:  [phone]                                   Phone:  [phone]                 Phone:  [phone]  [facility] Mental Health Phone:  [phone]  [facility] Child Abuse Hotline 614-793-1368 [phone] (After Hours)

## 2011-09-26 NOTE — ED Provider Notes (Signed)
Medical screening examination/treatment/procedure(s) were performed by non-physician practitioner and as supervising physician I was immediately available for consultation/collaboration.   Domingue Coltrain D Kylei Purington, MD 09/26/11 0558 

## 2011-09-26 NOTE — ED Notes (Signed)
Pt moved to [id number]

## 2011-11-21 ENCOUNTER — Inpatient Hospital Stay (HOSPITAL_COMMUNITY)
Admission: AD | Admit: 2011-11-21 | Discharge: 2011-11-22 | Disposition: A | Discharge status: Home / Self Care | Payer: Medicaid Other | Source: Ambulatory Visit | Attending: Obstetrics & Gynecology | Admitting: Obstetrics & Gynecology

## 2011-11-21 ENCOUNTER — Encounter (HOSPITAL_COMMUNITY): Payer: Self-pay | Admitting: *Deleted

## 2011-11-21 DIAGNOSIS — O99891 Other specified diseases and conditions complicating pregnancy: Secondary | ICD-10-CM | POA: Insufficient documentation

## 2011-11-21 DIAGNOSIS — M25559 Pain in unspecified hip: Secondary | ICD-10-CM | POA: Insufficient documentation

## 2011-11-21 DIAGNOSIS — R42 Dizziness and giddiness: Secondary | ICD-10-CM | POA: Insufficient documentation

## 2011-11-21 LAB — URINALYSIS, ROUTINE W REFLEX MICROSCOPIC
Glucose, UA: NEGATIVE mg/dL
Leukocytes, UA: NEGATIVE
Protein, ur: NEGATIVE mg/dL
Specific Gravity, Urine: 1.015 (ref 1.005–1.030)
Urobilinogen, UA: 1 mg/dL (ref 0.0–1.0)

## 2011-11-21 NOTE — MAU Note (Signed)
Pt reports for the last week she has been having pressure in her rt hip, pressure is worse when she is standing. Denies bleeding. Also reports that 2 days ago she had dizziness when she was lying down. Has appointment at [facility] to begin care on June 29th

## 2011-11-21 NOTE — MAU Provider Note (Signed)
History     CSN: [id number]  Arrival date and time: 11/21/11 2134   First Provider Initiated Contact with Patient 11/21/11 2248      Chief Complaint  Patient presents with  . Hip Pain   HPI Patient is a 21yo woman, G3P1011, [redacted]w[redacted]d, with no significant PMH. Patient reports that 3 days ago on Sunday, she began developing a heavy pressure on her right thigh. Pain/pressure began 3 days ago with no relief after tylenol. She does stand approx. 8 hours a day with few breaks. The pain/pressure is limited to her right hip and covers a region about 5cm x 5cm with no radiation of pain. The pain began as 10/10 on Sunday and currently in MAU she reports the pain as an 8/10. The pressure is alleviated when she lays flat and is aggravated by standing or walking on it. She has tried tylenol for the pain without relief. She does note an episode of dizziness on Monday with nausea but no vomiting and reports that she has not experienced this pressure or dizziness before. She denies any decrease in appetite or poor PO intake lately. She is establishing OB care at [facility] with her next appointment on 12/01/11.  OB History    Grav Para Term Preterm Abortions TAB SAB Ect Mult Living   3 1 1  1  1   1       Past Medical History  Diagnosis Date  . No pertinent past medical history     Past Surgical History  Procedure Date  . Dilation and curettage of uterus 2008    Family History  Problem Relation Age of Onset  . Other Neg Hx     History  Substance Use Topics  . Smoking status: Former Smoker -- 0.5 packs/day    Types: Cigarettes    Quit date: 08/21/2011  . Smokeless tobacco: Not on file  . Alcohol Use: No    Allergies: No Known Allergies  Prescriptions prior to admission  Medication Sig Dispense Refill  . Prenatal Vit-Fe Fumarate-FA (PRENATAL MULTIVITAMIN) 60-1 MG tablet Take 1 tablet by mouth daily.  30 tablet  0  . DISCONTD: fluticasone (FLONASE) 50 MCG/ACT nasal spray Place 2 sprays into the  nose daily. Nasal congestion        Review of Systems  Constitutional: Negative.   HENT: Negative.   Eyes: Negative.   Respiratory: Negative.   Cardiovascular: Negative.   Gastrointestinal: Negative.   Genitourinary: Negative.   Musculoskeletal: Negative.   Skin: Negative.   Neurological: Positive for dizziness.  Endo/Heme/Allergies: Negative.   Psychiatric/Behavioral: Negative.    Physical Exam   Blood pressure 103/63, pulse 98, temperature 99.2 F (37.3 C), temperature source Oral, resp. rate 16, height 5\' 2"  (1.575 m), weight 53.071 kg (117 lb), last menstrual period 06/29/2011, SpO2 100.00%.  Physical Exam  Constitutional: She is oriented to person, place, and time. She appears well-developed and well-nourished.  HENT:  Head: Normocephalic.  Eyes: Conjunctivae are normal.  Neck: Normal range of motion. Neck supple.  Cardiovascular: Normal rate, regular rhythm and normal heart sounds.   Respiratory: Effort normal and breath sounds normal.  GI: Soft. Bowel sounds are normal.  Genitourinary: No bleeding around the vagina. No vaginal discharge found.  Musculoskeletal: She exhibits tenderness ( Right hip to mid right thigh).  Neurological: She is alert and oriented to person, place, and time.  Skin: Skin is warm and dry.  Cervix - not examined (pain isolated to hip)  MAU Course  Procedures  MDM Results for orders placed during the hospital encounter of 11/21/11 (from the past 24 hour(s))  URINALYSIS, ROUTINE W REFLEX MICROSCOPIC     Status: Abnormal   Collection Time   11/21/11 10:00 PM      Component Value Range   Color, Urine YELLOW  YELLOW   APPearance CLEAR  CLEAR   Specific Gravity, Urine 1.015  1.005 - 1.030   pH 7.0  5.0 - 8.0   Glucose, UA NEGATIVE  NEGATIVE mg/dL   Hgb urine dipstick NEGATIVE  NEGATIVE   Bilirubin Urine NEGATIVE  NEGATIVE   Ketones, ur 15 (*) NEGATIVE mg/dL   Protein, ur NEGATIVE  NEGATIVE mg/dL   Urobilinogen, UA 1.0  0.0 - 1.0 mg/dL    Nitrite NEGATIVE  NEGATIVE   Leukocytes, UA NEGATIVE  NEGATIVE     Assessment and Plan   Right thigh/hip pain  Recommend patient apply heating pad to site as needed  Continue tylenol PRN for pain - Rest ad lib  Dizziness - Most likely due to exertion and decreased hydration 2/2 demand of pregnancy. - will recommend her to increase fluid intake minimum 8 - 10 glasses water daily  [facility] 11/21/2011, 10:59 PM

## 2011-11-21 NOTE — Discharge Instructions (Signed)
[facility]    [facility]  [phone]     Phone: [phone]          [facility]                      [facility]  @[place]     Phone: [phone]  Phone: [phone]         [facility]     Phone: [phone]  Phone: [phone]           [facility] [place]                hone: [phone]  Phone: [phone]         [facility] [address]ise Ceo      Phone: [phone]  Phone: [phone]         [facility]                Phone: [phone]  [facility]   Phone:[phone]    [facility] [address] [place])          Phone: [phone] [facility]   Phone: [phone]   Arthralgia Your caregiver has diagnosed you as suffering from an arthralgia. Arthralgia means there is pain in a joint. This can come from many reasons including:  Bruising the joint which causes soreness (inflammation) in the joint.   Wear and tear on the joints which occur as we grow older (osteoarthritis).   Overusing the joint.   Various forms of arthritis.   Infections of the joint.  Regardless of the cause of pain in your joint, most of these different pains respond to anti-inflammatory drugs and rest. The exception to this is when a joint is infected, and these cases are treated with antibiotics, if it is a bacterial infection. HOME CARE INSTRUCTIONS   Rest the injured area for as long as directed by your caregiver. Then slowly start using the joint as directed by your caregiver and as the pain allows. Crutches as directed may be useful if the ankles, knees or hips are involved. If the knee was splinted or casted, continue use and care as directed. If an stretchy or elastic wrapping bandage has been applied today, it should be removed and re-applied every 3 to 4  hours. It should not be applied tightly, but firmly enough to keep swelling down. Watch toes and feet for swelling, bluish discoloration, coldness, numbness or excessive pain. If any of these problems (symptoms) occur, remove the ace bandage and re-apply more loosely. If these symptoms persist, contact your caregiver or return to this location.   For the first 24 hours, keep the injured extremity elevated on pillows while lying down.   Apply ice for 15 to 20 minutes to the sore joint every couple hours while awake for the first half day. Then 3 to 4 times per day for the first 48 hours. Put the ice in a plastic bag and place a towel between the bag of ice and your skin.   Wear any splinting, casting, elastic bandage applications, or slings as instructed.   Only take over-the-counter or prescription medicines for pain, discomfort, or fever as directed by your caregiver. Do not use aspirin immediately after the injury unless instructed by your physician. Aspirin can cause increased bleeding and bruising of  the tissues.   If you were given crutches, continue to use them as instructed and do not resume weight bearing on the sore joint until instructed.  Persistent pain and inability to use the sore joint as directed for more than 2 to 3 days are warning signs indicating that you should see a caregiver for a follow-up visit as soon as possible. Initially, a hairline fracture (break in bone) may not be evident on X-rays. Persistent pain and swelling indicate that further evaluation, non-weight bearing or use of the joint (use of crutches or slings as instructed), or further X-rays are indicated. X-rays may sometimes not show a small fracture until a week or 10 days later. Make a follow-up appointment with your own caregiver or one to whom we have referred you. A radiologist (specialist in reading X-rays) may read your X-rays. Make sure you know how you are to obtain your X-ray results. Do not assume everything  is normal if you do not hear from [place]. SEEK MEDICAL CARE IF: Bruising, swelling, or pain increases. SEEK IMMEDIATE MEDICAL CARE IF:   Your fingers or toes are numb or blue.   The pain is not responding to medications and continues to stay the same or get worse.   The pain in your joint becomes severe.   You develop a fever over 102 F (38.9 C).   It becomes impossible to move or use the joint.  MAKE SURE YOU:   Understand these instructions.   Will watch your condition.   Will get help right away if you are not doing well or get worse.  Document Released: 05/21/2005 Document Revised: 05/10/2011 Document Reviewed: 01/07/2008 [facility] Patient Information 2012 [place], [place].

## 2011-11-21 NOTE — MAU Note (Addendum)
Pt reports pain in R hip for one week which is worse with activity. Pt also reports that she had a dizzy spell on Monday. Pt states that she needs a proof pregnancy letter.

## 2011-11-23 ENCOUNTER — Encounter (HOSPITAL_COMMUNITY): Payer: Self-pay | Admitting: *Deleted

## 2011-11-23 ENCOUNTER — Emergency Department (HOSPITAL_COMMUNITY)
Admission: EM | Admit: 2011-11-23 | Discharge: 2011-11-24 | Disposition: A | Discharge status: Home / Self Care | Payer: No Typology Code available for payment source | Attending: Emergency Medicine | Admitting: Emergency Medicine

## 2011-11-23 DIAGNOSIS — O99891 Other specified diseases and conditions complicating pregnancy: Secondary | ICD-10-CM | POA: Insufficient documentation

## 2011-11-23 DIAGNOSIS — S20219A Contusion of unspecified front wall of thorax, initial encounter: Secondary | ICD-10-CM | POA: Insufficient documentation

## 2011-11-23 DIAGNOSIS — Y998 Other external cause status: Secondary | ICD-10-CM | POA: Insufficient documentation

## 2011-11-23 DIAGNOSIS — Y93I9 Activity, other involving external motion: Secondary | ICD-10-CM | POA: Insufficient documentation

## 2011-11-23 DIAGNOSIS — Z87891 Personal history of nicotine dependence: Secondary | ICD-10-CM | POA: Insufficient documentation
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
## 2011-11-24 MED ORDER — ACETAMINOPHEN 325 MG PO TABS
650.0000 mg | ORAL_TABLET | Freq: Once | ORAL | Status: AC
  Administered 2011-11-24: 650 mg via ORAL
  Filled 2011-11-24: qty 2
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
## 2011-11-24 NOTE — ED Notes (Signed)
Fetal HR strong with good variability  145-155 bpm

## 2011-11-24 NOTE — ED Notes (Signed)
Rx x 0, pt voiced understanding to f/u with PCP and OBGYN

## 2011-12-08 ENCOUNTER — Encounter (HOSPITAL_COMMUNITY): Payer: Self-pay

## 2011-12-08 ENCOUNTER — Inpatient Hospital Stay (HOSPITAL_COMMUNITY)
Admission: AD | Admit: 2011-12-08 | Discharge: 2011-12-08 | Disposition: A | Discharge status: Home / Self Care | Payer: Medicaid Other | Source: Ambulatory Visit | Attending: Obstetrics & Gynecology | Admitting: Obstetrics & Gynecology

## 2011-12-08 DIAGNOSIS — O99891 Other specified diseases and conditions complicating pregnancy: Secondary | ICD-10-CM | POA: Insufficient documentation

## 2011-12-08 DIAGNOSIS — R109 Unspecified abdominal pain: Secondary | ICD-10-CM | POA: Insufficient documentation

## 2011-12-08 DIAGNOSIS — O26899 Other specified pregnancy related conditions, unspecified trimester: Secondary | ICD-10-CM

## 2011-12-08 LAB — URINALYSIS, ROUTINE W REFLEX MICROSCOPIC
Glucose, UA: NEGATIVE mg/dL
Nitrite: NEGATIVE

## 2011-12-08 LAB — URINE MICROSCOPIC-ADD ON

## 2011-12-08 MED ORDER — IBUPROFEN 600 MG PO TABS
600.0000 mg | ORAL_TABLET | Freq: Once | ORAL | Status: AC
  Administered 2011-12-08: 600 mg via ORAL
  Filled 2011-12-08: qty 1
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
## 2011-12-10 NOTE — MAU Provider Note (Signed)
Medical Screening exam and patient care preformed by advanced practice provider.  Agree with the above management.  

## 2012-01-16 ENCOUNTER — Ambulatory Visit (INDEPENDENT_AMBULATORY_CARE_PROVIDER_SITE_OTHER): Payer: Medicaid Other | Admitting: Obstetrics and Gynecology

## 2012-01-16 DIAGNOSIS — D573 Sickle-cell trait: Secondary | ICD-10-CM

## 2012-01-16 DIAGNOSIS — R87619 Unspecified abnormal cytological findings in specimens from cervix uteri: Secondary | ICD-10-CM

## 2012-01-16 DIAGNOSIS — Z3480 Encounter for supervision of other normal pregnancy, unspecified trimester: Secondary | ICD-10-CM

## 2012-01-16 DIAGNOSIS — O99345 Other mental disorders complicating the puerperium: Secondary | ICD-10-CM

## 2012-01-16 DIAGNOSIS — IMO0002 Reserved for concepts with insufficient information to code with codable children: Secondary | ICD-10-CM

## 2012-01-17 ENCOUNTER — Encounter: Payer: Self-pay | Admitting: Obstetrics and Gynecology

## 2012-01-17 ENCOUNTER — Ambulatory Visit (INDEPENDENT_AMBULATORY_CARE_PROVIDER_SITE_OTHER): Payer: Medicaid Other | Admitting: Obstetrics and Gynecology

## 2012-01-17 VITALS — BP 94/50 | Wt 125.0 lb

## 2012-01-17 DIAGNOSIS — Z3689 Encounter for other specified antenatal screening: Secondary | ICD-10-CM

## 2012-01-17 DIAGNOSIS — D649 Anemia, unspecified: Secondary | ICD-10-CM

## 2012-01-17 DIAGNOSIS — Z124 Encounter for screening for malignant neoplasm of cervix: Secondary | ICD-10-CM

## 2012-01-17 DIAGNOSIS — F53 Postpartum depression: Secondary | ICD-10-CM | POA: Insufficient documentation

## 2012-01-17 DIAGNOSIS — R87619 Unspecified abnormal cytological findings in specimens from cervix uteri: Secondary | ICD-10-CM | POA: Insufficient documentation

## 2012-01-17 DIAGNOSIS — Z331 Pregnant state, incidental: Secondary | ICD-10-CM

## 2012-01-17 DIAGNOSIS — Z3490 Encounter for supervision of normal pregnancy, unspecified, unspecified trimester: Secondary | ICD-10-CM

## 2012-01-17 LAB — PRENATAL PANEL VII
Antibody Screen: NEGATIVE
Basophils Absolute: 0 10*3/uL (ref 0.0–0.1)
Eosinophils Absolute: 0.2 10*3/uL (ref 0.0–0.7)
Eosinophils Relative: 2 % (ref 0–5)
HCT: 29.2 % — ABNORMAL LOW (ref 36.0–46.0)
Lymphocytes Relative: 29 % (ref 12–46)
MCH: 35.6 pg — ABNORMAL HIGH (ref 26.0–34.0)
MCHC: 33.9 g/dL (ref 30.0–36.0)
MCV: 105 fL — ABNORMAL HIGH (ref 78.0–100.0)
Monocytes Absolute: 0.9 10*3/uL (ref 0.1–1.0)
RDW: 12.4 % (ref 11.5–15.5)
Rh Type: POSITIVE
Rubella: 66.5 IU/mL
WBC: 8 10*3/uL (ref 4.0–10.5)

## 2012-01-17 LAB — POCT WET PREP (WET MOUNT)
Bacteria Wet Prep HPF POC: NEGATIVE
WBC, Wet Prep HPF POC: NEGATIVE
pH: 4

## 2012-01-17 LAB — POCT URINALYSIS DIPSTICK
Spec Grav, UA: <=1.005
Urobilinogen, UA: 1
pH, UA: 7

## 2012-01-17 LAB — GLUCOSE TOLERANCE, 1 HOUR (50G) W/O FASTING: Glucose, 1 Hour GTT: 70 mg/dL (ref 70–140)

## 2012-01-17 MED ORDER — FERRALET 90 90-1 MG PO TABS: 1.0000 | ORAL_TABLET | Freq: Once | ORAL | Status: DC

## 2012-01-17 NOTE — Patient Instructions (Addendum)
Pt to see [facility] tomorrow for NOB work up. Anatomy U/S needs to be scheduled asap. Shana up front said she would call her to sched. Alvino Chapel

## 2012-01-17 NOTE — Progress Notes (Signed)
[redacted]w[redacted]d Subjective:    Jennifer Peters is being seen today for her first obstetrical visit at [redacted]w[redacted]d gestation by LMP. Late to [facility].  She reports feeling well with mild nausea at intervals.  Her obstetrical history is significant for: Patient Active Problem List  Diagnosis  . Anemia  . Post partum depression  . Abnormal Pap smear of cervix  . Sickle cell trait    Relationship with FOB:  involved  Patient does intend to breast feed.   Pregnancy history fully reviewed SVD x 1, Term , Female, uncomplicated. Had anemia with this pregnancy. SAB 2009 at 13 wks with D&E. Uncomplicated.    Review of Systems Pertinent ROS is described in HPI   Objective:   BP 94/50  Wt 125 lb (56.7 kg)  LMP 06/29/2011 Wt Readings from Last 1 Encounters:  01/17/12 125 lb (56.7 kg)   BMI: There is no height on file to calculate BMI.  General: alert, cooperative and no distress Respiratory: clear to auscultation bilaterally Cardiovascular: regular rate and rhythm, S1, S2 normal, no murmur Breasts:  No dominant masses, nipples erect Gastrointestinal: soft, non-tender; no masses,  no organomegaly Extremities: extremities normal, no pain or edema Vaginal Bleeding: None  EXTERNAL GENITALIA: normal appearing vulva with no masses, tenderness or lesions VAGINA: no abnormal discharge or lesions CERVIX: no lesions or cervical motion tenderness; cervix closed, long, firm UTERUS: gravid and consistent with 28 weeks ADNEXA: no masses palpable and nontender OB EXAM PELVIMETRY: appears adequate   FHR:  135 bpm  Assessment:    Pregnancy at [redacted]w[redacted]d   Plan:     Prenatal panel reviewed and discussed with the patient:yes Pap smear collected:yes GC/Chlamydia collected:yes Wet prep:  PH 4.o no whiff , neg Discussion of Genetic testing options:  Offered and unsure. Prenatal vitamins recommended taking same ans ordered Ferralet 90 today due to low Hgb  Problem list reviewed and updated.  Plan of  care: Follow up in 1 weeks for anatomy scan  Earl Gala CNM, [place] 01/17/2012 6:28 PM

## 2012-01-17 NOTE — Progress Notes (Signed)
C/o:  ? Round ligament pain and decreased appetite.

## 2012-01-18 LAB — HEMOGLOBINOPATHY EVALUATION
Hemoglobin Other: 0 %
Hgb A: 97.2 % (ref 96.8–97.8)

## 2012-01-21 ENCOUNTER — Encounter: Payer: Medicaid Other | Admitting: Obstetrics and Gynecology

## 2012-01-21 ENCOUNTER — Other Ambulatory Visit: Payer: Medicaid Other

## 2012-01-21 LAB — PAP IG, CT-NG, RFX HPV ASCU

## 2012-01-23 ENCOUNTER — Encounter: Payer: Self-pay | Admitting: Obstetrics and Gynecology

## 2012-01-23 ENCOUNTER — Ambulatory Visit (INDEPENDENT_AMBULATORY_CARE_PROVIDER_SITE_OTHER): Payer: Medicaid Other

## 2012-01-23 ENCOUNTER — Telehealth: Payer: Self-pay

## 2012-01-23 ENCOUNTER — Other Ambulatory Visit: Payer: Self-pay

## 2012-01-23 ENCOUNTER — Ambulatory Visit (INDEPENDENT_AMBULATORY_CARE_PROVIDER_SITE_OTHER): Payer: Medicaid Other | Admitting: Obstetrics and Gynecology

## 2012-01-23 VITALS — BP 92/48 | Wt 125.0 lb

## 2012-01-23 DIAGNOSIS — Z3689 Encounter for other specified antenatal screening: Secondary | ICD-10-CM

## 2012-01-23 DIAGNOSIS — D649 Anemia, unspecified: Secondary | ICD-10-CM

## 2012-01-23 DIAGNOSIS — Z3480 Encounter for supervision of other normal pregnancy, unspecified trimester: Secondary | ICD-10-CM

## 2012-01-23 MED ORDER — PRENATAL MULTIVITAMIN CH: 1.0000 | ORAL_TABLET | Freq: Every day | ORAL | Status: DC

## 2012-01-23 MED ORDER — FERRALET 90 90-1 MG PO TABS: 1.0000 | ORAL_TABLET | Freq: Once | ORAL | Status: DC

## 2012-01-23 NOTE — Telephone Encounter (Signed)
Message copied by Rolla Plate on Wed Jan 23, 2012 10:39 AM ------      Message from: Jaymes Graff      Created: Tue Jan 22, 2012  1:16 PM       Pt needs to take FeSO4 325 mg BID.  Disp #90 with three refill.      Encourage pt to drink plenty of water and take stool softener to avoid constipation.      Pt also needs her b12 and folate levels checked @her  [place]

## 2012-01-23 NOTE — Progress Notes (Signed)
[place] for anatomy  EFW 2-2 <10 %  cx 3.90 cm SIUP vtx FLPK 5.3cm normal umbilical artery dopplers no reverse or absent flow.   IUGR vs improper dating.  The patient has late [facility] and no other [place] .  Will do twice weekly bpp and dopplers.  Recheck growth in two weeks.  If growth is linear change EDD to 04/23/12.  If IUGR is present consult with MD on call or the MD seeing the patient will dictate a plan
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
## 2012-01-23 NOTE — Patient Instructions (Signed)
Fetal Movement Counts Patient Name: __________________________________________________ Patient Due Date: ____________________ Kick counts is highly recommended in high risk pregnancies, but it is a good idea for every pregnant woman to do. Start counting fetal movements at 28 weeks of the pregnancy. Fetal movements increase after eating a full meal or eating or drinking something sweet (the blood sugar is higher). It is also important to drink plenty of fluids (well hydrated) before doing the count. Lie on your left side because it helps with the circulation or you can sit in a comfortable chair with your arms over your belly (abdomen) with no distractions around you. DOING THE COUNT  Try to do the count the same time of day each time you do it.   Mark the day and time, then see how long it takes for you to feel 10 movements (kicks, flutters, swishes, rolls). You should have at least 10 movements within 2 hours. You will most likely feel 10 movements in much less than 2 hours. If you do not, wait an hour and count again. After a couple of days you will see a pattern.   What you are looking for is a change in the pattern or not enough counts in 2 hours. Is it taking longer in time to reach 10 movements?  SEEK MEDICAL CARE IF:  You feel less than 10 counts in 2 hours. Tried twice.   No movement in one hour.   The pattern is changing or taking longer each day to reach 10 counts in 2 hours.   You feel the baby is not moving as it usually does.  Date: ____________ Movements: ____________ Start time: ____________ Finish time: ____________  Date: ____________ Movements: ____________ Start time: ____________ Finish time: ____________ Date: ____________ Movements: ____________ Start time: ____________ Finish time: ____________ Date: ____________ Movements: ____________ Start time: ____________ Finish time: ____________ Date: ____________ Movements: ____________ Start time: ____________ Finish time:  ____________ Date: ____________ Movements: ____________ Start time: ____________ Finish time: ____________ Date: ____________ Movements: ____________ Start time: ____________ Finish time: ____________ Date: ____________ Movements: ____________ Start time: ____________ Finish time: ____________  Date: ____________ Movements: ____________ Start time: ____________ Finish time: ____________ Date: ____________ Movements: ____________ Start time: ____________ Finish time: ____________ Date: ____________ Movements: ____________ Start time: ____________ Finish time: ____________ Date: ____________ Movements: ____________ Start time: ____________ Finish time: ____________ Date: ____________ Movements: ____________ Start time: ____________ Finish time: ____________ Date: ____________ Movements: ____________ Start time: ____________ Finish time: ____________ Date: ____________ Movements: ____________ Start time: ____________ Finish time: ____________  Date: ____________ Movements: ____________ Start time: ____________ Finish time: ____________ Date: ____________ Movements: ____________ Start time: ____________ Finish time: ____________ Date: ____________ Movements: ____________ Start time: ____________ Finish time: ____________ Date: ____________ Movements: ____________ Start time: ____________ Finish time: ____________ Date: ____________ Movements: ____________ Start time: ____________ Finish time: ____________ Date: ____________ Movements: ____________ Start time: ____________ Finish time: ____________ Date: ____________ Movements: ____________ Start time: ____________ Finish time: ____________  Date: ____________ Movements: ____________ Start time: ____________ Finish time: ____________ Date: ____________ Movements: ____________ Start time: ____________ Finish time: ____________ Date: ____________ Movements: ____________ Start time: ____________ Finish time: ____________ Date: ____________ Movements:  ____________ Start time: ____________ Finish time: ____________ Date: ____________ Movements: ____________ Start time: ____________ Finish time: ____________ Date: ____________ Movements: ____________ Start time: ____________ Finish time: ____________ Date: ____________ Movements: ____________ Start time: ____________ Finish time: ____________  Date: ____________ Movements: ____________ Start time: ____________ Finish time: ____________ Date: ____________ Movements: ____________ Start time: ____________ Finish time: ____________ Date: ____________ Movements: ____________ Start time:   ____________ Finish time: ____________ Date: ____________ Movements: ____________ Start time: ____________ Finish time: ____________ Date: ____________ Movements: ____________ Start time: ____________ Finish time: ____________ Date: ____________ Movements: ____________ Start time: ____________ Finish time: ____________ Date: ____________ Movements: ____________ Start time: ____________ Finish time: ____________  Date: ____________ Movements: ____________ Start time: ____________ Finish time: ____________ Date: ____________ Movements: ____________ Start time: ____________ Finish time: ____________ Date: ____________ Movements: ____________ Start time: ____________ Finish time: ____________ Date: ____________ Movements: ____________ Start time: ____________ Finish time: ____________ Date: ____________ Movements: ____________ Start time: ____________ Finish time: ____________ Date: ____________ Movements: ____________ Start time: ____________ Finish time: ____________ Date: ____________ Movements: ____________ Start time: ____________ Finish time: ____________  Date: ____________ Movements: ____________ Start time: ____________ Finish time: ____________ Date: ____________ Movements: ____________ Start time: ____________ Finish time: ____________ Date: ____________ Movements: ____________ Start time: ____________ Finish  time: ____________ Date: ____________ Movements: ____________ Start time: ____________ Finish time: ____________ Date: ____________ Movements: ____________ Start time: ____________ Finish time: ____________ Date: ____________ Movements: ____________ Start time: ____________ Finish time: ____________ Date: ____________ Movements: ____________ Start time: ____________ Finish time: ____________  Date: ____________ Movements: ____________ Start time: ____________ Finish time: ____________ Date: ____________ Movements: ____________ Start time: ____________ Finish time: ____________ Date: ____________ Movements: ____________ Start time: ____________ Finish time: ____________ Date: ____________ Movements: ____________ Start time: ____________ Finish time: ____________ Date: ____________ Movements: ____________ Start time: ____________ Finish time: ____________ Date: ____________ Movements: ____________ Start time: ____________ Finish time: ____________ Document Released: 06/20/2006 Document Revised: 05/10/2011 Document Reviewed: 12/21/2008 ExitCare Patient Information 2012 [employer]. 

## 2012-01-23 NOTE — Progress Notes (Signed)
Pt needs rx for iron and pnv.
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
## 2012-01-28 LAB — US OB COMP + 14 WK
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
## 2012-01-30 ENCOUNTER — Other Ambulatory Visit: Payer: Self-pay | Admitting: Obstetrics and Gynecology

## 2012-01-30 ENCOUNTER — Other Ambulatory Visit (INDEPENDENT_AMBULATORY_CARE_PROVIDER_SITE_OTHER): Payer: Medicaid Other

## 2012-01-30 ENCOUNTER — Encounter: Payer: Self-pay | Admitting: Obstetrics and Gynecology

## 2012-01-30 ENCOUNTER — Ambulatory Visit (INDEPENDENT_AMBULATORY_CARE_PROVIDER_SITE_OTHER): Payer: Medicaid Other | Admitting: Obstetrics and Gynecology

## 2012-01-30 VITALS — BP 88/50 | Wt 128.0 lb

## 2012-01-30 DIAGNOSIS — O26849 Uterine size-date discrepancy, unspecified trimester: Secondary | ICD-10-CM

## 2012-01-30 DIAGNOSIS — O0930 Supervision of pregnancy with insufficient antenatal care, unspecified trimester: Secondary | ICD-10-CM | POA: Insufficient documentation

## 2012-01-30 DIAGNOSIS — IMO0002 Reserved for concepts with insufficient information to code with codable children: Secondary | ICD-10-CM | POA: Insufficient documentation

## 2012-01-30 DIAGNOSIS — D573 Sickle-cell trait: Secondary | ICD-10-CM

## 2012-01-30 DIAGNOSIS — O4190X0 Disorder of amniotic fluid and membranes, unspecified, unspecified trimester, not applicable or unspecified: Secondary | ICD-10-CM

## 2012-01-30 DIAGNOSIS — O288 Other abnormal findings on antenatal screening of mother: Secondary | ICD-10-CM

## 2012-01-30 LAB — POCT URINALYSIS DIPSTICK
Glucose, UA: NEGATIVE
Nitrite, UA: NEGATIVE
Urobilinogen, UA: 4

## 2012-01-30 NOTE — Progress Notes (Signed)
Doing well. Has some sporadic swelling in feet and legs--no evidence of edema today, and negative Homan's sign. 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  Questions how work schedule should change as pregnancy progresses.  Reviewed no current recommendation for being OOW, but her situation would be evaluated as the pregnancy progresses.  Advised patient that pregnancy is not a disability. Reviewed labs--patient is sickle cell negative.  Chart corrected and patient informed.  [place] today for BPP/doppers: Cerebellum measures 29 weeks--US tech feels patient less likely to have IUGR, more likely to have dating that is off due to late to [place] dating/anatomy. Per [place] tech, she feels EDC is more likely 04/23/12. AFI 15, 50%ile.  Cervix 3.42. BPP 8/8, dopplers WNL  Patient will have dopplers, BPP, growth next week--per ND, if growth is linear, will establish EDC as 04/23/12 (vs. 04/04/12).     BPP  Growth NV 1 week

## 2012-01-30 NOTE — Progress Notes (Signed)
C/o edema in feet and calf muscles  Informed pt we need clean catch due to sickle cell trait. Pt states she didn't know she had sickle cell trait. Please advise.

## 2012-02-01 ENCOUNTER — Telehealth: Payer: Self-pay

## 2012-02-01 NOTE — Telephone Encounter (Signed)
spoke with pt rgd results pt states received results already

## 2012-02-06 ENCOUNTER — Other Ambulatory Visit: Payer: Self-pay | Admitting: Obstetrics and Gynecology

## 2012-02-06 ENCOUNTER — Ambulatory Visit (INDEPENDENT_AMBULATORY_CARE_PROVIDER_SITE_OTHER): Payer: Medicaid Other

## 2012-02-06 ENCOUNTER — Encounter: Payer: Self-pay | Admitting: Obstetrics and Gynecology

## 2012-02-06 ENCOUNTER — Ambulatory Visit (INDEPENDENT_AMBULATORY_CARE_PROVIDER_SITE_OTHER): Payer: Medicaid Other | Admitting: Obstetrics and Gynecology

## 2012-02-06 VITALS — BP 96/52 | Wt 129.0 lb

## 2012-02-06 DIAGNOSIS — O4190X0 Disorder of amniotic fluid and membranes, unspecified, unspecified trimester, not applicable or unspecified: Secondary | ICD-10-CM

## 2012-02-06 DIAGNOSIS — O3660X0 Maternal care for excessive fetal growth, unspecified trimester, not applicable or unspecified: Secondary | ICD-10-CM

## 2012-02-06 DIAGNOSIS — O365990 Maternal care for other known or suspected poor fetal growth, unspecified trimester, not applicable or unspecified: Secondary | ICD-10-CM

## 2012-02-06 DIAGNOSIS — IMO0002 Reserved for concepts with insufficient information to code with codable children: Secondary | ICD-10-CM

## 2012-02-06 DIAGNOSIS — O288 Other abnormal findings on antenatal screening of mother: Secondary | ICD-10-CM

## 2012-02-06 LAB — US OB FOLLOW UP

## 2012-02-06 NOTE — Progress Notes (Signed)
[redacted]w[redacted]d Ultrasound shows:  SIUP  S=D     [place] EDD: 04/25/2012           AFI: 16.88 EFW: 2 lb 9 oz           Cervical length: n/a           Placenta localization: anterior           Fetal presentation: Footling Breech                   Anatomy survey is normal Comments: Footling breech presentation, anterior placenta, AFI is normal (60th%) BPP = 8/8 Normal umbilical artery dopplers (no reversed or absent flow is seen) S/D ratio = 50th% RI = 50th % Mean for [redacted] weeks gestation (S/D ration = 2.86,RI = 0.65) Normal adnexa's

## 2012-02-06 NOTE — Progress Notes (Signed)
[redacted]w[redacted]d IUGR <3 % with normal Dopplers and AFI, BPP 8/8: OOW, increase protein, modified bed rest, plan consultation with [facility] for WIC GFM Follow-up 1 week with BPP / Dopplers

## 2012-02-07 ENCOUNTER — Telehealth: Payer: Self-pay

## 2012-02-07 NOTE — Telephone Encounter (Signed)
[facility] notified of pt information per SR.  States she will follow up with her today.  ld

## 2012-02-07 NOTE — Telephone Encounter (Signed)
Message copied by Larwance Rote on Thu Feb 07, 2012 10:05 AM ------      Message from: Silverio Lay      Created: Wed Feb 06, 2012  3:42 PM      Regarding: WIC       Please set up with Shaunda for [facility]

## 2012-02-13 ENCOUNTER — Other Ambulatory Visit: Payer: Self-pay | Admitting: Obstetrics and Gynecology

## 2012-02-13 ENCOUNTER — Encounter: Payer: Self-pay | Admitting: Obstetrics and Gynecology

## 2012-02-13 ENCOUNTER — Ambulatory Visit (INDEPENDENT_AMBULATORY_CARE_PROVIDER_SITE_OTHER): Payer: Medicaid Other | Admitting: Obstetrics and Gynecology

## 2012-02-13 ENCOUNTER — Ambulatory Visit (INDEPENDENT_AMBULATORY_CARE_PROVIDER_SITE_OTHER): Payer: Medicaid Other

## 2012-02-13 VITALS — BP 102/60 | Wt 127.0 lb

## 2012-02-13 DIAGNOSIS — IMO0002 Reserved for concepts with insufficient information to code with codable children: Secondary | ICD-10-CM

## 2012-02-13 DIAGNOSIS — O288 Other abnormal findings on antenatal screening of mother: Secondary | ICD-10-CM

## 2012-02-13 DIAGNOSIS — O365990 Maternal care for other known or suspected poor fetal growth, unspecified trimester, not applicable or unspecified: Secondary | ICD-10-CM

## 2012-02-13 NOTE — Progress Notes (Signed)
U/S today AFI 18.5cm, cx 3.1cm, BPP 8/8, nl fluid, vtx, ant placenta, nl dopplers No complaints except occas pain in mid lower abdomen, no urinary sxs and pain doesn't last long FKCs U/S for EFW, BPP and dopplers at NV in 1wk

## 2012-02-15 ENCOUNTER — Other Ambulatory Visit: Payer: Self-pay | Admitting: Obstetrics and Gynecology

## 2012-02-15 DIAGNOSIS — IMO0002 Reserved for concepts with insufficient information to code with codable children: Secondary | ICD-10-CM

## 2012-02-20 ENCOUNTER — Ambulatory Visit (INDEPENDENT_AMBULATORY_CARE_PROVIDER_SITE_OTHER): Payer: Medicaid Other

## 2012-02-20 ENCOUNTER — Encounter: Payer: Self-pay | Admitting: Obstetrics and Gynecology

## 2012-02-20 ENCOUNTER — Ambulatory Visit (INDEPENDENT_AMBULATORY_CARE_PROVIDER_SITE_OTHER): Payer: Medicaid Other | Admitting: Obstetrics and Gynecology

## 2012-02-20 VITALS — BP 98/52 | Wt 128.0 lb

## 2012-02-20 DIAGNOSIS — O365990 Maternal care for other known or suspected poor fetal growth, unspecified trimester, not applicable or unspecified: Secondary | ICD-10-CM

## 2012-02-20 DIAGNOSIS — IMO0002 Reserved for concepts with insufficient information to code with codable children: Secondary | ICD-10-CM

## 2012-02-20 NOTE — Progress Notes (Signed)
[redacted]w[redacted]d  GFM IUGR with BPP 8/8 and normal Dopplers

## 2012-02-20 NOTE — Progress Notes (Signed)
Pt stated no issues today.  

## 2012-02-20 NOTE — Progress Notes (Signed)
Sono:  EFW:  3lb 6oz +/- 5oz  AFI:  15.0cm  CX:  3.10cm   Vertex presentation  Anterior placenta.  Normal fluid:  AFI = 50th%tile  EFW = 10th%tile  1537 gr at [redacted]w[redacted]d  Umbilical dopplers normal:  S/D: 2.52  Score BPP 8/8 in 30 min  CX closed.

## 2012-02-20 NOTE — Patient Instructions (Signed)
Needs note Out of work until delivery du to pregnancy complications

## 2012-02-25 LAB — US OB FOLLOW UP

## 2012-02-27 ENCOUNTER — Ambulatory Visit (INDEPENDENT_AMBULATORY_CARE_PROVIDER_SITE_OTHER): Payer: Medicaid Other

## 2012-02-27 ENCOUNTER — Encounter: Payer: Self-pay | Admitting: Obstetrics and Gynecology

## 2012-02-27 ENCOUNTER — Ambulatory Visit (INDEPENDENT_AMBULATORY_CARE_PROVIDER_SITE_OTHER): Payer: Medicaid Other | Admitting: Obstetrics and Gynecology

## 2012-02-27 VITALS — BP 96/52 | Wt 124.0 lb

## 2012-02-27 DIAGNOSIS — IMO0002 Reserved for concepts with insufficient information to code with codable children: Secondary | ICD-10-CM

## 2012-02-27 DIAGNOSIS — O365990 Maternal care for other known or suspected poor fetal growth, unspecified trimester, not applicable or unspecified: Secondary | ICD-10-CM

## 2012-02-27 DIAGNOSIS — O418X90 Other specified disorders of amniotic fluid and membranes, unspecified trimester, not applicable or unspecified: Secondary | ICD-10-CM

## 2012-02-27 NOTE — Progress Notes (Signed)
[redacted]w[redacted]d IUGR, BPP, and AFI u/s today Anterior placenta AFI 35th% Umbilical Artery Dopplers WNL Mean for 34 wk s/d ratio = 2.08, RI=0.52

## 2012-02-27 NOTE — Progress Notes (Signed)
Doing well. [place] today:  BPP 8/8, normal dopplers.  Vtx, cervix 3.76, AFI 12.06, 35%ile. Findings reviewed. Patient understood SR to say she would be induced at 37 weeks.  I discussed that would be up to SR, and would also be based on [place] for growth, BPP, AFI, dopplers at NV.  Will refer discussion to SR at NV. GBS, cultures at NV.

## 2012-03-05 ENCOUNTER — Other Ambulatory Visit: Payer: Self-pay | Admitting: Obstetrics and Gynecology

## 2012-03-05 DIAGNOSIS — IMO0002 Reserved for concepts with insufficient information to code with codable children: Secondary | ICD-10-CM

## 2012-03-06 ENCOUNTER — Ambulatory Visit (INDEPENDENT_AMBULATORY_CARE_PROVIDER_SITE_OTHER): Payer: Medicaid Other | Admitting: Obstetrics and Gynecology

## 2012-03-06 ENCOUNTER — Ambulatory Visit (INDEPENDENT_AMBULATORY_CARE_PROVIDER_SITE_OTHER): Payer: Medicaid Other

## 2012-03-06 ENCOUNTER — Other Ambulatory Visit: Payer: Self-pay | Admitting: Obstetrics and Gynecology

## 2012-03-06 VITALS — BP 98/42 | Wt 134.0 lb

## 2012-03-06 DIAGNOSIS — O365990 Maternal care for other known or suspected poor fetal growth, unspecified trimester, not applicable or unspecified: Secondary | ICD-10-CM

## 2012-03-06 DIAGNOSIS — Z331 Pregnant state, incidental: Secondary | ICD-10-CM

## 2012-03-06 DIAGNOSIS — Z3490 Encounter for supervision of normal pregnancy, unspecified, unspecified trimester: Secondary | ICD-10-CM

## 2012-03-06 DIAGNOSIS — IMO0002 Reserved for concepts with insufficient information to code with codable children: Secondary | ICD-10-CM

## 2012-03-06 NOTE — Progress Notes (Signed)
Pt stated no issues today.  

## 2012-03-06 NOTE — Progress Notes (Signed)
[redacted]w[redacted]d Ultrasound with documented growth in 2 weeks still <10%, AFI and dopplers normal, BPP 8/8 GC/Chlamydia /GBS done Continue weekly follow-up with BPP and dopplers. Plan IOL 38-39 weeks

## 2012-03-06 NOTE — Progress Notes (Signed)
Sono:  EFW 4lb 9oz +/- 5oz  AFI 17.8 cm  BPP 8/8   Vertex presentation  Anterior placenta.  Normal fluid:  AFI = 65th%tile   EFW <10th %tile  2061 gr   BPP 8/8 in 16 minutes.   Normal umbilical dopplers:  S/D ratio = 2.45 mean for 36 weeks = 2.50  No flow reversal. No loss of diastolic flow.

## 2012-03-07 LAB — US OB FOLLOW UP

## 2012-03-07 LAB — GC/CHLAMYDIA PROBE AMP, GENITAL
Chlamydia, DNA Probe: NEGATIVE
GC Probe Amp, Genital: NEGATIVE

## 2012-03-12 ENCOUNTER — Ambulatory Visit (INDEPENDENT_AMBULATORY_CARE_PROVIDER_SITE_OTHER): Payer: Medicaid Other | Admitting: Obstetrics and Gynecology

## 2012-03-12 ENCOUNTER — Other Ambulatory Visit: Payer: Self-pay | Admitting: Obstetrics and Gynecology

## 2012-03-12 ENCOUNTER — Encounter: Payer: Self-pay | Admitting: Obstetrics and Gynecology

## 2012-03-12 ENCOUNTER — Ambulatory Visit (INDEPENDENT_AMBULATORY_CARE_PROVIDER_SITE_OTHER): Payer: Medicaid Other

## 2012-03-12 VITALS — BP 98/56 | Wt 136.0 lb

## 2012-03-12 DIAGNOSIS — O321XX0 Maternal care for breech presentation, not applicable or unspecified: Secondary | ICD-10-CM | POA: Insufficient documentation

## 2012-03-12 DIAGNOSIS — O3660X0 Maternal care for excessive fetal growth, unspecified trimester, not applicable or unspecified: Secondary | ICD-10-CM

## 2012-03-12 DIAGNOSIS — IMO0002 Reserved for concepts with insufficient information to code with codable children: Secondary | ICD-10-CM

## 2012-03-12 DIAGNOSIS — Z331 Pregnant state, incidental: Secondary | ICD-10-CM

## 2012-03-12 LAB — US OB FOLLOW UP

## 2012-03-12 NOTE — Progress Notes (Signed)
Patient ID: Nivedita Mirabella, female   DOB: 01-Jan-1991, 21 y.o.   MRN: [id number] [redacted]w[redacted]d Breech discussed risk of cord prolapse with breech presentation, exercise given, f/o with MD regarding delivery options. Reviewed s/s uc, srom, vag bleeding, daily fetal kick counts to report, comfort measures. Encouragged 8 water daily and frequent voids. Lavera Guise, CNM

## 2012-03-12 NOTE — Addendum Note (Signed)
Addended by: Darien Ramus on: 03/12/2012 04:55 PM   Modules accepted: Orders

## 2012-03-12 NOTE — Progress Notes (Signed)
[redacted]w[redacted]d Request cervix check.  GBS +  Ultrasound Comments: Footling breech presentation, anterior placenta, AFI is normal (65th%) Umbilical artery dopplers are normal (no reversed or absent flow is seen) S/D ratio and RI = 50th% Mean for 36 weeks (RI = 0.61, S/D Ratio = 2.50) BPP = 8/8 in 7 minutes Normal Adnexa's

## 2012-03-19 ENCOUNTER — Other Ambulatory Visit: Payer: Self-pay | Admitting: Obstetrics and Gynecology

## 2012-03-19 ENCOUNTER — Ambulatory Visit (INDEPENDENT_AMBULATORY_CARE_PROVIDER_SITE_OTHER): Payer: Medicaid Other

## 2012-03-19 ENCOUNTER — Ambulatory Visit (INDEPENDENT_AMBULATORY_CARE_PROVIDER_SITE_OTHER): Payer: Medicaid Other | Admitting: Obstetrics and Gynecology

## 2012-03-19 VITALS — BP 100/48 | Wt 139.0 lb

## 2012-03-19 DIAGNOSIS — IMO0002 Reserved for concepts with insufficient information to code with codable children: Secondary | ICD-10-CM

## 2012-03-19 DIAGNOSIS — O365990 Maternal care for other known or suspected poor fetal growth, unspecified trimester, not applicable or unspecified: Secondary | ICD-10-CM

## 2012-03-19 NOTE — Progress Notes (Signed)
[redacted]w[redacted]d GFM but different pt does FKC daily and easily passes IUGR with good interval growth and normal BPP/Dopplers GBS positive reviewed, booklet given Will plan IOL at 39 weeks. Follow-up 1 week with BPP and Dopplers

## 2012-03-19 NOTE — Progress Notes (Signed)
Sono: EFW 5lb 6oz +/- 6oz  < 10th%tile AFI 17.0 cm BPP 8/8 Vertex presentation Anterior plaenta Normal fluid: AFI 65th%tile EFW = <10th%tile Previous wt on 03-06-12 == 4lb 9oz Normal dopplers s/d ratio = 2.45

## 2012-03-19 NOTE — Progress Notes (Signed)
Pt stated that she doesn't feel baby move as much . Does get at least 4-6 kick counts in an hour. Pt stated no other issues today.

## 2012-03-21 ENCOUNTER — Encounter (HOSPITAL_COMMUNITY): Payer: Self-pay | Admitting: *Deleted

## 2012-03-21 ENCOUNTER — Telehealth (HOSPITAL_COMMUNITY): Payer: Self-pay | Admitting: *Deleted

## 2012-03-21 ENCOUNTER — Telehealth: Payer: Self-pay | Admitting: Obstetrics and Gynecology

## 2012-03-21 NOTE — Telephone Encounter (Signed)
Induction scheduled for 10//25/13 @ 7:30pm with AVS/CHS. -Adrianne Pridgen

## 2012-03-21 NOTE — Telephone Encounter (Signed)
Preadmission screen  

## 2012-03-25 LAB — US OB FOLLOW UP

## 2012-03-26 ENCOUNTER — Ambulatory Visit (INDEPENDENT_AMBULATORY_CARE_PROVIDER_SITE_OTHER): Payer: Medicaid Other

## 2012-03-26 ENCOUNTER — Ambulatory Visit (INDEPENDENT_AMBULATORY_CARE_PROVIDER_SITE_OTHER): Payer: Medicaid Other | Admitting: Obstetrics and Gynecology

## 2012-03-26 ENCOUNTER — Other Ambulatory Visit: Payer: Self-pay | Admitting: Obstetrics and Gynecology

## 2012-03-26 VITALS — BP 100/58 | Wt 142.0 lb

## 2012-03-26 DIAGNOSIS — IMO0002 Reserved for concepts with insufficient information to code with codable children: Secondary | ICD-10-CM

## 2012-03-26 DIAGNOSIS — O365990 Maternal care for other known or suspected poor fetal growth, unspecified trimester, not applicable or unspecified: Secondary | ICD-10-CM

## 2012-03-26 NOTE — Progress Notes (Signed)
Pt stated no issues today. Wants to discuss options without having a c/s .

## 2012-03-26 NOTE — Progress Notes (Signed)
Sono: AFI 14.1 cm BPP 8/8  Frank presentation Anterior placenta Normal fluid AFI = 60-th%tile Normal umbilical dopplers S/D ratio = 2.23  CX not measured

## 2012-03-27 NOTE — Progress Notes (Signed)
[redacted]w[redacted]d All ultrasounds reviewed: late entry to care at 28+6 weeks with uncertain LMP. Initially was given EDD:04/04/12. At 1st ultrasound: 10%         Follow-up ultrasound 1 week later showed reassuring fetal testing with cerebellum measurement indicating EDD:04/23/12         Patient was followed weekly for fetal testing and bi-weekly for growth studies which were always at the 10th percentile.         Reviewed all of these findings with patient and recommend MFM consultation 03/28/12 at 3:00 pm to clarify: 1. Best EDD                     2. Delivery recommendation                     3. If truly IUGR, can we offer trial of ECV for her breech presentation

## 2012-03-28 ENCOUNTER — Inpatient Hospital Stay (HOSPITAL_COMMUNITY): Admission: RE | Admit: 2012-03-28 | Payer: Medicaid Other | Source: Ambulatory Visit

## 2012-03-28 ENCOUNTER — Other Ambulatory Visit: Payer: Self-pay | Admitting: Obstetrics and Gynecology

## 2012-03-28 ENCOUNTER — Ambulatory Visit (HOSPITAL_COMMUNITY)
Admission: RE | Admit: 2012-03-28 | Discharge: 2012-03-28 | Disposition: A | Discharge status: Home / Self Care | Payer: Medicaid Other | Source: Ambulatory Visit | Attending: Obstetrics and Gynecology | Admitting: Obstetrics and Gynecology

## 2012-03-28 DIAGNOSIS — O2690 Pregnancy related conditions, unspecified, unspecified trimester: Secondary | ICD-10-CM

## 2012-03-28 DIAGNOSIS — O26849 Uterine size-date discrepancy, unspecified trimester: Secondary | ICD-10-CM | POA: Insufficient documentation

## 2012-03-28 DIAGNOSIS — Z3689 Encounter for other specified antenatal screening: Secondary | ICD-10-CM | POA: Insufficient documentation

## 2012-03-28 NOTE — Progress Notes (Signed)
Ms. Robideau was seen for ultrasound appointment today.  Please see [facility] report for details.

## 2012-03-30 ENCOUNTER — Inpatient Hospital Stay (HOSPITAL_COMMUNITY)
Admission: AD | Admit: 2012-03-30 | Discharge: 2012-03-30 | Disposition: A | Discharge status: Home / Self Care | Payer: Medicaid Other | Source: Ambulatory Visit | Attending: Obstetrics and Gynecology | Admitting: Obstetrics and Gynecology

## 2012-03-30 ENCOUNTER — Inpatient Hospital Stay (HOSPITAL_COMMUNITY): Payer: Medicaid Other

## 2012-03-30 DIAGNOSIS — O368190 Decreased fetal movements, unspecified trimester, not applicable or unspecified: Secondary | ICD-10-CM | POA: Insufficient documentation

## 2012-03-30 DIAGNOSIS — O365990 Maternal care for other known or suspected poor fetal growth, unspecified trimester, not applicable or unspecified: Secondary | ICD-10-CM | POA: Insufficient documentation

## 2012-03-30 DIAGNOSIS — O320XX0 Maternal care for unstable lie, not applicable or unspecified: Secondary | ICD-10-CM | POA: Diagnosis not present

## 2012-03-30 NOTE — Discharge Summary (Signed)
Seen on [facility] for triage--presented with decreased FM.  Does not require official d/c summary, since was not an inpatient. NST reactive, BPP 8/8 Home with [facility] instructions Office will call to schedule her appointment for this late week with BPP per recommendation by MFM.  Nigel Bridgeman, CNM

## 2012-03-30 NOTE — MAU Note (Signed)
Pt sent to rm [id number] for evaluation due to a full MAU unit

## 2012-03-30 NOTE — MAU Provider Note (Signed)
History   21 yo G3P1011 at 108 4/7 weeks presented unannounced c/o decreased FM tonight.  Has been evaluated at MFM due to dating discrepancy due to late to care, with concern for IUGR earlier in pregnancy--final determination of EDC per MFM recommendation is 04/23/12, now 36/4/7 weeks.  Had [place] at MFM on Friday, with EFW 5 + 12 (36%ile), AFI 20, 76%ile.  Recommendation made for weekly antenatal testing.  Patient Active Problem List  Diagnosis  . Anemia  . Post partum depression  . Abnormal Pap smear of cervix  . Insufficient prenatal care  . IUGR (intrauterine growth restriction)  . Unstable lie of fetus  No clear dx of IUGR at present, now that [facility] has been clarified.   Chief Complaint  Patient presents with  . Decreased Fetal Movement     OB History    Grav Para Term Preterm Abortions TAB SAB Ect Mult Living   3 1 1  1  1   1       Past Medical History  Diagnosis Date  . No pertinent past medical history   . Anemia   . Post partum depression     Advanced FOB age 57  . Abnormal Pap smear of cervix     unknown stage of abnormality  . Sickle cell trait     ? FH of [place] trait  . Hx of chlamydia infection     Past Surgical History  Procedure Date  . Dilation and curettage of uterus   . Wisdom tooth extracted x1 in 04/2011     Family History  Problem Relation Age of Onset  . Other Neg Hx   . Diabetes Maternal Aunt   . Kidney disease Maternal Grandmother   . Heart disease Maternal Grandmother     chf  . Hypertension Mother     History  Substance Use Topics  . Smoking status: Former Smoker -- 0.5 packs/day for 3 years    Quit date: 08/21/2011  . Smokeless tobacco: Never Used  . Alcohol Use: No    Allergies: No Known Allergies  Prescriptions prior to admission  Medication Sig Dispense Refill  . acetaminophen (TYLENOL) 500 MG tablet Take 500 mg by mouth every 6 (six) hours as needed. For pain      . Fe Cbn-Fe Gluc-FA-B12-C-DSS (FERRALET 90) 90-1 MG TABS Take 1  capsule by mouth once.  30 each  4  . Prenatal Vit-Fe Fumarate-FA (PRENATAL MULTIVITAMIN) TABS Take 1 tablet by mouth daily.  90 tablet  3     Physical Exam   Blood pressure 117/73, pulse 108, temperature 98.3 F (36.8 C), temperature source Oral, resp. rate 20, last menstrual period 06/29/2011.  Chest clear Heart RRR without murmur Abd gravid, NT Pelvic--deferred Ext WNL  FHR reactive, no decels. No UCs  ED Course  IUP at 36 4/7 weeks Decreased FM--still perceives less FM  Plan: Check BPP to complete assessment of fetal status. If WNL, plan d/c.   Nigel Bridgeman CNM, [place] 03/30/2012 11:06 PM   Addendum: Returned from [facility] = 8/8, with subjectively normal fluid. Patient reassured regarding fetal status. D/C'd home with [facility] instructions. Office will call to schedule appt late this week for ROB and BPP (per MFM recommendation).  Nigel Bridgeman, CNM

## 2012-03-30 NOTE — Discharge Instructions (Signed)
Fetal Monitoring, Fetal Movement Assessment  Fetal movement assessment (FMA) is done by the pregnant woman herself by counting and recording the baby's movements over a certain time period. It is done to see if there are problems with the pregnancy and the baby. Identifying and correcting problems may prevent serious problems from developing with the fetus, including fetal loss. Some pregnancies are complicated by the mother's medical problems. Some of these problems are type 1 diabetes mellitus, high blood pressure and other chronic medical illnesses. This is why it is important to monitor the baby before birth.   OTHER TECHNIQUES OF MONITORING YOUR BABY BEFORE BIRTH  Several tests are in use. These include:  · Nonstress test (NST). This test monitors the baby's heart rate when the baby moves.  · Contraction stress test (CST). This test monitors the baby's heart rate during a contraction of the uterus.  · Fetal biophysical profile (BPP). This measures and evaluates 5 observations of the baby:  · The nonstress test.  · The baby's breathing.  · The baby's movements.  · The baby's muscle tone.  · The amount of amniotic fluid.  · Modified BPP. This measures the volume of fluid in different parts of the amniotic sac (amniotic fluid index) and the results of the nonstress test.  · Umbilical artery doppler velocimetry. This evaluates the blood flow through the umbilical cord.  There are several very serious problems that cannot be predicted or detected with any of the fetal monitoring procedures. These problems include separation (abruption) of the placenta or when the fetus chokes on the umbilical cord (umbilical cord accident).  Your caregiver will help you understand your tests and what they mean for you and your baby. It is your responsibility to obtain the results of your test.  LET YOUR CAREGIVER KNOW ABOUT:   · Any medications you are taking including prescription and over-the-counter drugs, herbs, eye drops and  creams.  · If you have a fever.  · If you have an infection.  · If you are sick.  RISKS AND COMPLICATIONS   There are no risks or complications to the mother or fetus with FMA.  BEFORE THE PROCEDURE  · Do not take medications that may decrease or increase the baby's heart rate and/or movements.  · Eat a full meal at least 2 hours before the test.  · Do not smoke if you are pregnant. If you smoke, stop at least 2 days before the test. It is best not to smoke at all when you are pregnant.  PROCEDURE  Sometimes, a mother notices her baby moves less before there are problems. Because of this, it is believed that fetal movement checking by the mother (kick counts) is a good way to check the baby before birth.  There are different ways of doing this. Two good ways are:  · The woman lies on her side and counts distinct (individual) fetal movements. A feeling of 10 distinct movements in a period of up to 2 hours is considered reassuring. When 10 movements are felt, you may stop counting.  · Women are instructed to count fetal movements for 1 hour, three times per week. The count is good if, after one week, it equals or is over the woman's previously established baseline count. If the count is lower, further checking of your baby is needed.  AFTER THE PROCEDURE  You may resume your usual activities.  HOME CARE INSTRUCTIONS   · Follow your caregiver's advice and recommendations.  ·   Be aware of your baby's movements. Are they normal, less than usual or more than usual?  · Make and keep the rest of your prenatal appointments.  SEEK MEDICAL CARE IF:   · You develop a temperature of 100° F (37.8° C) or higher.  · You have a bloody mucus discharge from the vagina (a bloody show).  SEEK IMMEDIATE MEDICAL CARE IF:   · You do not feel the baby move.  · You think the baby's movements are too little or too many.  · You develop contractions.  · You develop vaginal bleeding.  · You have belly (abdominal) pain.  · You have leaking or a  gush of fluid from the vagina.  Document Released: 05/11/2002 Document Revised: 08/13/2011 Document Reviewed: 09/13/2008  ExitCare® Patient Information ©2013 [employer].

## 2012-03-31 ENCOUNTER — Other Ambulatory Visit: Payer: Self-pay

## 2012-03-31 ENCOUNTER — Telehealth: Payer: Self-pay | Admitting: Obstetrics and Gynecology

## 2012-03-31 ENCOUNTER — Telehealth: Payer: Self-pay

## 2012-03-31 DIAGNOSIS — O26849 Uterine size-date discrepancy, unspecified trimester: Secondary | ICD-10-CM

## 2012-03-31 NOTE — Telephone Encounter (Signed)
Pt will c/b to set up an appointment.

## 2012-03-31 NOTE — Telephone Encounter (Signed)
Message copied by Janeece Agee on Mon Mar 31, 2012  9:08 AM ------      Message from: Cornelius Moras      Created: Sun Mar 30, 2012 11:14 PM      Regarding: need ROB appt this week       This patient doesn't have ROB appt--needs one late this week, with BPP due to dating discrepancy.            Thanks!      VL

## 2012-03-31 NOTE — Telephone Encounter (Signed)
Pt was called following message left in the triage pool listed as night call. Pt left message complaining of decrease fetal movement. Pt stated that she had gone to the ER on last pm and was seen by CNM Tommie Sams, Camillia Herter

## 2012-03-31 NOTE — Telephone Encounter (Signed)
TC from pt. Pt is flexible but 04/04/12 she can only come in the am's. Appointment scheduled 04/03/12 @ 2pm for BPP u/s for dating discrepancy & ROB after u/s with VL. Pt voices understanding.

## 2012-04-02 ENCOUNTER — Other Ambulatory Visit: Payer: Self-pay

## 2012-04-02 DIAGNOSIS — IMO0002 Reserved for concepts with insufficient information to code with codable children: Secondary | ICD-10-CM

## 2012-04-03 ENCOUNTER — Encounter: Payer: Self-pay | Admitting: Obstetrics and Gynecology

## 2012-04-03 ENCOUNTER — Ambulatory Visit (INDEPENDENT_AMBULATORY_CARE_PROVIDER_SITE_OTHER): Payer: Medicaid Other | Admitting: Obstetrics and Gynecology

## 2012-04-03 ENCOUNTER — Ambulatory Visit (INDEPENDENT_AMBULATORY_CARE_PROVIDER_SITE_OTHER): Payer: Medicaid Other

## 2012-04-03 VITALS — BP 98/54 | Wt 142.0 lb

## 2012-04-03 DIAGNOSIS — IMO0002 Reserved for concepts with insufficient information to code with codable children: Secondary | ICD-10-CM

## 2012-04-03 DIAGNOSIS — O3660X0 Maternal care for excessive fetal growth, unspecified trimester, not applicable or unspecified: Secondary | ICD-10-CM

## 2012-04-03 DIAGNOSIS — O26849 Uterine size-date discrepancy, unspecified trimester: Secondary | ICD-10-CM

## 2012-04-03 DIAGNOSIS — Z23 Encounter for immunization: Secondary | ICD-10-CM

## 2012-04-03 NOTE — Progress Notes (Signed)
Doing well.   [place] today, with normal BPP and AFI. Plan weekly BPP per MFM, check growth NV due to newly established due date of 04/23/12.   No IUGR now per MFM--recommend awaiting spontaneous labor. Reviewed EDC decision and previous EFWs with patient.

## 2012-04-03 NOTE — Progress Notes (Signed)
Pt stated no issues today.pt got flu shot today/ pt wants a cervix check today.

## 2012-04-03 NOTE — Progress Notes (Signed)
IUGR and BPP u/s today Anterior placenta WNL AFI 65th%tile BPP 8/8

## 2012-04-03 NOTE — Addendum Note (Signed)
Addended by: Janeece Agee on: 04/03/2012 04:32 PM   Modules accepted: Orders

## 2012-04-08 ENCOUNTER — Ambulatory Visit (INDEPENDENT_AMBULATORY_CARE_PROVIDER_SITE_OTHER): Payer: Medicaid Other

## 2012-04-08 ENCOUNTER — Ambulatory Visit (INDEPENDENT_AMBULATORY_CARE_PROVIDER_SITE_OTHER): Payer: Medicaid Other | Admitting: Obstetrics and Gynecology

## 2012-04-08 ENCOUNTER — Encounter: Payer: Self-pay | Admitting: Obstetrics and Gynecology

## 2012-04-08 VITALS — BP 114/78 | Wt 142.0 lb

## 2012-04-08 DIAGNOSIS — Z3490 Encounter for supervision of normal pregnancy, unspecified, unspecified trimester: Secondary | ICD-10-CM

## 2012-04-08 DIAGNOSIS — O26849 Uterine size-date discrepancy, unspecified trimester: Secondary | ICD-10-CM

## 2012-04-08 DIAGNOSIS — Z331 Pregnant state, incidental: Secondary | ICD-10-CM

## 2012-04-08 NOTE — Progress Notes (Signed)
[redacted]w[redacted]d  GBS: POSITIVE   Pt c/o: pt is concerned about baby size.  U/S: IUGR, BPP/dopp  Vertex presentation. Anterior placenta. Normal fluid.  Concordant growth with an EDD of 04/23/2012. EFW = 58th%tile  Score BPP 8 of 8 in 11 minutes.  Cx not measured.

## 2012-04-08 NOTE — Progress Notes (Signed)
[redacted]w[redacted]d Dating for the patient was uncertain.  Maternal fetal medicine recommends weekly antenatal testing. ultrasound: Single gestation, vertex, normal fluid,biophysical profile 8 out of 8, size 58th percentile. Positive beta strep. Return office in one week.  Biophysical profile next visit. Dr. Stefano Gaul

## 2012-04-09 ENCOUNTER — Ambulatory Visit (INDEPENDENT_AMBULATORY_CARE_PROVIDER_SITE_OTHER): Payer: Medicaid Other | Admitting: Obstetrics and Gynecology

## 2012-04-09 ENCOUNTER — Encounter: Payer: Self-pay | Admitting: Obstetrics and Gynecology

## 2012-04-09 ENCOUNTER — Telehealth: Payer: Self-pay | Admitting: Obstetrics and Gynecology

## 2012-04-09 VITALS — BP 102/62 | Wt 142.0 lb

## 2012-04-09 DIAGNOSIS — O320XX0 Maternal care for unstable lie, not applicable or unspecified: Secondary | ICD-10-CM

## 2012-04-09 NOTE — Telephone Encounter (Signed)
Pt called, is 36 wks 6days, states has been having cramping since 0700, is feeling a lot of pressure, pt denies any bldg or LOF.  Pt says she has been drinking a lot of fluids today, does report + fm today.  Pt says she is feeling tightening off/on, but has not timed them, is unsure if she is having contractions.  Pt scheduled an appt today @ 1630 for eval w/ VL.

## 2012-04-09 NOTE — Progress Notes (Signed)
Pt stated no other issues today besides the pressure. Pt wants a cervix check today.

## 2012-04-09 NOTE — Progress Notes (Signed)
Here for increased pressure, unsure if having contractions. No leaking or bleeding.  +FM. Very unclear regarding what contractions are or feel like--"was induced last time and didn't feel anything till they told me to push", but this was with epidural placed fairly early in labor process. No contractions palpated or felt by patient during 20 minute visit.  May have had 3-4 in an hour before evaluation. S/S of labor reviewed, support offered for discomforts of 3rd trimester. Vtx is lower in pelvis than yesterday, but no cervical change. Reviewed revision of EDC to 11/20 by MFM consult. To call with any concerns or questions regarding status or sx. Will keep appt next Wednesday for BPP and visit--weekly BPP due to issue of late to care, dating, and size, but last MFM [place] on 10/26 showed EFW of 36%ile, and no indication for induction unless fetal status changes.

## 2012-04-15 LAB — US OB FOLLOW UP

## 2012-04-16 ENCOUNTER — Other Ambulatory Visit: Payer: Medicaid Other

## 2012-04-16 ENCOUNTER — Encounter (HOSPITAL_COMMUNITY): Payer: Self-pay | Admitting: Anesthesiology

## 2012-04-16 ENCOUNTER — Inpatient Hospital Stay (HOSPITAL_COMMUNITY): Payer: Medicaid Other | Admitting: Anesthesiology

## 2012-04-16 ENCOUNTER — Encounter (HOSPITAL_COMMUNITY): Payer: Self-pay | Admitting: *Deleted

## 2012-04-16 ENCOUNTER — Encounter: Payer: Medicaid Other | Admitting: Obstetrics and Gynecology

## 2012-04-16 ENCOUNTER — Inpatient Hospital Stay (HOSPITAL_COMMUNITY)
Admission: AD | Admit: 2012-04-16 | Discharge: 2012-04-18 | DRG: 775 | Disposition: A | Discharge status: Home / Self Care | Payer: Medicaid Other | Source: Ambulatory Visit | Attending: Obstetrics and Gynecology | Admitting: Obstetrics and Gynecology

## 2012-04-16 DIAGNOSIS — O0930 Supervision of pregnancy with insufficient antenatal care, unspecified trimester: Secondary | ICD-10-CM | POA: Insufficient documentation

## 2012-04-16 DIAGNOSIS — O479 False labor, unspecified: Secondary | ICD-10-CM

## 2012-04-16 DIAGNOSIS — O99892 Other specified diseases and conditions complicating childbirth: Secondary | ICD-10-CM | POA: Diagnosis present

## 2012-04-16 DIAGNOSIS — O26843 Uterine size-date discrepancy, third trimester: Secondary | ICD-10-CM | POA: Insufficient documentation

## 2012-04-16 DIAGNOSIS — Z22330 Carrier of Group B streptococcus: Secondary | ICD-10-CM

## 2012-04-16 DIAGNOSIS — O9903 Anemia complicating the puerperium: Secondary | ICD-10-CM | POA: Diagnosis not present

## 2012-04-16 DIAGNOSIS — D649 Anemia, unspecified: Secondary | ICD-10-CM | POA: Diagnosis not present

## 2012-04-16 LAB — CBC
MCV: 104.9 fL — ABNORMAL HIGH (ref 78.0–100.0)
Platelets: 219 10*3/uL (ref 150–400)
RDW: 12.5 % (ref 11.5–15.5)
WBC: 11.7 10*3/uL — ABNORMAL HIGH (ref 4.0–10.5)

## 2012-04-16 MED ORDER — ONDANSETRON HCL 4 MG/2ML IJ SOLN: 4.0000 mg | INTRAMUSCULAR | Status: DC | PRN

## 2012-04-16 MED ORDER — FENTANYL 2.5 MCG/ML BUPIVACAINE 1/10 % EPIDURAL INFUSION (WH - ANES)
14.0000 mL/h | INTRAMUSCULAR | Status: DC
  Administered 2012-04-16 (×2): 14 mL/h via EPIDURAL
  Filled 2012-04-16 (×2): qty 125

## 2012-04-16 MED ORDER — EPHEDRINE 5 MG/ML INJ
10.0000 mg | INTRAVENOUS | Status: DC | PRN
  Filled 2012-04-16: qty 4

## 2012-04-16 MED ORDER — FENTANYL CITRATE 0.05 MG/ML IJ SOLN: 100.0000 ug | Freq: Once | INTRAMUSCULAR | Status: DC

## 2012-04-16 MED ORDER — OXYCODONE-ACETAMINOPHEN 5-325 MG PO TABS: 1.0000 | ORAL_TABLET | ORAL | Status: DC | PRN

## 2012-04-16 MED ORDER — LANOLIN HYDROUS EX OINT: TOPICAL_OINTMENT | CUTANEOUS | Status: DC | PRN

## 2012-04-16 MED ORDER — MEASLES, MUMPS & RUBELLA VAC ~~LOC~~ INJ
0.5000 mL | INJECTION | Freq: Once | SUBCUTANEOUS | Status: DC
  Filled 2012-04-16: qty 0.5

## 2012-04-16 MED ORDER — OXYCODONE-ACETAMINOPHEN 5-325 MG PO TABS
1.0000 | ORAL_TABLET | ORAL | Status: DC | PRN
  Administered 2012-04-17: 2 via ORAL
  Filled 2012-04-16: qty 2

## 2012-04-16 MED ORDER — HYDROXYZINE HCL 50 MG PO TABS: 50.0000 mg | ORAL_TABLET | Freq: Four times a day (QID) | ORAL | Status: DC | PRN

## 2012-04-16 MED ORDER — DEXTROSE 5 % IV SOLN
2.5000 10*6.[IU] | INTRAVENOUS | Status: DC
  Administered 2012-04-16 (×3): 2.5 10*6.[IU] via INTRAVENOUS
  Filled 2012-04-16 (×5): qty 2.5

## 2012-04-16 MED ORDER — DIPHENHYDRAMINE HCL 25 MG PO CAPS: 25.0000 mg | ORAL_CAPSULE | Freq: Four times a day (QID) | ORAL | Status: DC | PRN

## 2012-04-16 MED ORDER — CITRIC ACID-SODIUM CITRATE 334-500 MG/5ML PO SOLN: 30.0000 mL | ORAL | Status: DC | PRN

## 2012-04-16 MED ORDER — SENNOSIDES-DOCUSATE SODIUM 8.6-50 MG PO TABS
2.0000 | ORAL_TABLET | Freq: Every day | ORAL | Status: DC
  Administered 2012-04-17: 2 via ORAL

## 2012-04-16 MED ORDER — ONDANSETRON HCL 4 MG PO TABS: 4.0000 mg | ORAL_TABLET | ORAL | Status: DC | PRN

## 2012-04-16 MED ORDER — PENICILLIN G POTASSIUM 5000000 UNITS IJ SOLR
5.0000 10*6.[IU] | Freq: Once | INTRAVENOUS | Status: AC
  Administered 2012-04-16: 5 10*6.[IU] via INTRAVENOUS
  Filled 2012-04-16: qty 5

## 2012-04-16 MED ORDER — PHENYLEPHRINE 40 MCG/ML (10ML) SYRINGE FOR IV PUSH (FOR BLOOD PRESSURE SUPPORT): 80.0000 ug | PREFILLED_SYRINGE | INTRAVENOUS | Status: DC | PRN

## 2012-04-16 MED ORDER — ONDANSETRON HCL 4 MG/2ML IJ SOLN: 4.0000 mg | Freq: Four times a day (QID) | INTRAMUSCULAR | Status: DC | PRN

## 2012-04-16 MED ORDER — LIDOCAINE HCL (PF) 1 % IJ SOLN
30.0000 mL | INTRAMUSCULAR | Status: DC | PRN
  Filled 2012-04-16: qty 30

## 2012-04-16 MED ORDER — BISACODYL 10 MG RE SUPP: 10.0000 mg | Freq: Every day | RECTAL | Status: DC | PRN

## 2012-04-16 MED ORDER — WITCH HAZEL-GLYCERIN EX PADS: 1.0000 "application " | MEDICATED_PAD | CUTANEOUS | Status: DC | PRN

## 2012-04-16 MED ORDER — FE FUMARATE-B12-VIT C-FA-IFC PO CAPS
1.0000 | ORAL_CAPSULE | Freq: Once | ORAL | Status: AC
  Administered 2012-04-17: 1 via ORAL
  Filled 2012-04-16: qty 1

## 2012-04-16 MED ORDER — LIDOCAINE HCL (PF) 1 % IJ SOLN
INTRAMUSCULAR | Status: DC | PRN
  Administered 2012-04-16 (×2): 5 mL

## 2012-04-16 MED ORDER — LACTATED RINGERS IV SOLN: 500.0000 mL | Freq: Once | INTRAVENOUS | Status: DC

## 2012-04-16 MED ORDER — IBUPROFEN 600 MG PO TABS: 600.0000 mg | ORAL_TABLET | Freq: Four times a day (QID) | ORAL | Status: DC | PRN

## 2012-04-16 MED ORDER — LACTATED RINGERS IV BOLUS (SEPSIS)
1000.0000 mL | Freq: Once | INTRAVENOUS | Status: AC
  Administered 2012-04-16: 1000 mL via INTRAVENOUS

## 2012-04-16 MED ORDER — BENZOCAINE-MENTHOL 20-0.5 % EX AERO
1.0000 "application " | INHALATION_SPRAY | CUTANEOUS | Status: DC | PRN
  Administered 2012-04-17: 1 via TOPICAL
  Filled 2012-04-16: qty 56

## 2012-04-16 MED ORDER — ZOLPIDEM TARTRATE 5 MG PO TABS: 5.0000 mg | ORAL_TABLET | Freq: Every evening | ORAL | Status: DC | PRN

## 2012-04-16 MED ORDER — TERBUTALINE SULFATE 1 MG/ML IJ SOLN: 0.2500 mg | Freq: Once | INTRAMUSCULAR | Status: DC | PRN

## 2012-04-16 MED ORDER — LACTATED RINGERS IV SOLN
500.0000 mL | INTRAVENOUS | Status: DC | PRN
  Administered 2012-04-16 (×2): 500 mL via INTRAVENOUS

## 2012-04-16 MED ORDER — EPHEDRINE 5 MG/ML INJ: 10.0000 mg | INTRAVENOUS | Status: DC | PRN

## 2012-04-16 MED ORDER — HYDROXYZINE HCL 50 MG/ML IM SOLN: 50.0000 mg | Freq: Four times a day (QID) | INTRAMUSCULAR | Status: DC | PRN

## 2012-04-16 MED ORDER — SIMETHICONE 80 MG PO CHEW: 80.0000 mg | CHEWABLE_TABLET | ORAL | Status: DC | PRN

## 2012-04-16 MED ORDER — ACETAMINOPHEN 325 MG PO TABS: 650.0000 mg | ORAL_TABLET | ORAL | Status: DC | PRN

## 2012-04-16 MED ORDER — DIPHENHYDRAMINE HCL 50 MG/ML IJ SOLN: 12.5000 mg | INTRAMUSCULAR | Status: DC | PRN

## 2012-04-16 MED ORDER — LACTATED RINGERS IV SOLN
INTRAVENOUS | Status: DC
  Administered 2012-04-16 (×2): via INTRAVENOUS

## 2012-04-16 MED ORDER — TETANUS-DIPHTH-ACELL PERTUSSIS 5-2.5-18.5 LF-MCG/0.5 IM SUSP
0.5000 mL | Freq: Once | INTRAMUSCULAR | Status: AC
  Administered 2012-04-17: 0.5 mL via INTRAMUSCULAR
  Filled 2012-04-16: qty 0.5

## 2012-04-16 MED ORDER — PHENYLEPHRINE 40 MCG/ML (10ML) SYRINGE FOR IV PUSH (FOR BLOOD PRESSURE SUPPORT)
80.0000 ug | PREFILLED_SYRINGE | INTRAVENOUS | Status: DC | PRN
  Filled 2012-04-16: qty 5

## 2012-04-16 MED ORDER — DIBUCAINE 1 % RE OINT: 1.0000 "application " | TOPICAL_OINTMENT | RECTAL | Status: DC | PRN

## 2012-04-16 MED ORDER — OXYTOCIN BOLUS FROM INFUSION: 500.0000 mL | INTRAVENOUS | Status: DC

## 2012-04-16 MED ORDER — ZOLPIDEM TARTRATE 5 MG PO TABS: 5.0000 mg | ORAL_TABLET | Freq: Once | ORAL | Status: DC

## 2012-04-16 MED ORDER — OXYTOCIN 40 UNITS IN LACTATED RINGERS INFUSION - SIMPLE MED
1.0000 m[IU]/min | INTRAVENOUS | Status: DC
  Administered 2012-04-16: 2 m[IU]/min via INTRAVENOUS

## 2012-04-16 MED ORDER — FLEET ENEMA 7-19 GM/118ML RE ENEM: 1.0000 | ENEMA | Freq: Every day | RECTAL | Status: DC | PRN

## 2012-04-16 MED ORDER — IBUPROFEN 600 MG PO TABS
600.0000 mg | ORAL_TABLET | Freq: Four times a day (QID) | ORAL | Status: DC
  Administered 2012-04-17 – 2012-04-18 (×6): 600 mg via ORAL
  Filled 2012-04-16 (×5): qty 1

## 2012-04-16 MED ORDER — OXYTOCIN 40 UNITS IN LACTATED RINGERS INFUSION - SIMPLE MED
62.5000 mL/h | INTRAVENOUS | Status: DC
  Filled 2012-04-16: qty 1000

## 2012-04-16 MED ORDER — PRENATAL MULTIVITAMIN CH
1.0000 | ORAL_TABLET | Freq: Every day | ORAL | Status: DC
  Administered 2012-04-17 – 2012-04-18 (×2): 1 via ORAL
  Filled 2012-04-16 (×2): qty 1

## 2012-04-16 NOTE — Anesthesia Procedure Notes (Signed)
Epidural Patient location during procedure: OB Start time: 04/16/2012 10:11 AM  Staffing Anesthesiologist: Brayton Caves R Performed by: anesthesiologist   Preanesthetic Checklist Completed: patient identified, site marked, surgical consent, pre-op evaluation, timeout performed, IV checked, risks and benefits discussed and monitors and equipment checked  Epidural Patient position: sitting Prep: site prepped and draped and DuraPrep Patient monitoring: continuous pulse ox and blood pressure Approach: midline Injection technique: LOR air and LOR saline  Needle:  Needle type: Tuohy  Needle gauge: 17 G Needle length: 9 cm and 9 Needle insertion depth: 5 cm cm Catheter type: closed end flexible Catheter size: 19 Gauge Catheter at skin depth: 10 cm Test dose: negative  Assessment Events: blood not aspirated, injection not painful, no injection resistance, negative IV test and no paresthesia  Additional Notes Patient identified.  Risk benefits discussed including failed block, incomplete pain control, headache, nerve damage, paralysis, blood pressure changes, nausea, vomiting, reactions to medication both toxic or allergic, and postpartum back pain.  Patient expressed understanding and wished to proceed.  All questions were answered.  Sterile technique used throughout procedure and epidural site dressed with sterile barrier dressing. No paresthesia or other complications noted.The patient did not experience any signs of intravascular injection such as tinnitus or metallic taste in mouth nor signs of intrathecal spread such as rapid motor block. Please see nursing notes for vital signs.

## 2012-04-16 NOTE — Anesthesia Preprocedure Evaluation (Signed)
Anesthesia Evaluation  Patient identified by MRN, date of birth, ID band Patient awake    Reviewed: Allergy & Precautions, H&P , Patient's Chart, lab work & pertinent test results  Airway Mallampati: II TM Distance: >3 FB Neck ROM: full    Dental No notable dental hx.    Pulmonary neg pulmonary ROS,  breath sounds clear to auscultation  Pulmonary exam normal       Cardiovascular negative cardio ROS  Rhythm:regular Rate:Normal     Neuro/Psych negative neurological ROS  negative psych ROS   GI/Hepatic negative GI ROS, Neg liver ROS,   Endo/Other  negative endocrine ROS  Renal/GU negative Renal ROS     Musculoskeletal   Abdominal   Peds  Hematology negative hematology ROS (+)   Anesthesia Other Findings No pertinent past medical history     Anemia        Post partum depression   Advanced FOB age 59 Abnormal Pap smear of cervix   unknown stage of abnormality    Sickle cell trait   ? FH of [place] trait Hx of chlamydia infection    Reproductive/Obstetrics (+) Pregnancy                           Anesthesia Physical Anesthesia Plan  ASA: II  Anesthesia Plan: Epidural   Post-op Pain Management:    Induction:   Airway Management Planned:   Additional Equipment:   Intra-op Plan:   Post-operative Plan:   Informed Consent: I have reviewed the patients History and Physical, chart, labs and discussed the procedure including the risks, benefits and alternatives for the proposed anesthesia with the patient or authorized representative who has indicated his/her understanding and acceptance.     Plan Discussed with:   Anesthesia Plan Comments:         Anesthesia Quick Evaluation

## 2012-04-16 NOTE — Progress Notes (Signed)
Patient ID: Jennifer Peters, female   DOB: 11-17-1990, 21 y.o.   MRN: [id number] .Subjective: Comfortable w epidural    Objective: BP 102/60  Pulse 82  Temp 98.1 F (36.7 C) (Oral)  Resp 20  Ht 5\' 2"  (1.575 m)  Wt 145 lb (65.772 kg)  BMI 26.52 kg/m2  SpO2 100%  LMP 06/29/2011   FHT:  FHR: 130 bpm, variability: moderate,  accelerations:  Present,  decelerations:  Present occ variable  UC:   regular, every 3-4 minutes SVE:   Dilation: 4 Effacement (%): 70 Station: -2 Exam by:: Early Ord,CNM  AROM scant clear fluid, bloody show IUPC placed without difficulty   Assessment / Plan: foley bulb out Progressing  Titrate pitocin for adequate ctx     Fetal Wellbeing:  Category I Pain Control:  Epidural  Dr Stefano Gaul updated   Jennifer Peters 04/16/2012, 12:53 PM

## 2012-04-16 NOTE — Progress Notes (Signed)
Patient ID: Jennifer Peters, female   DOB: 26-Jul-1990, 21 y.o.   MRN: [id number] .Subjective: Breathing through some ctx, but has been trying to rest    Objective: BP 116/68  Pulse 68  Temp 98.7 F (37.1 C) (Oral)  Resp 20  Ht 5\' 2"  (1.575 m)  Wt 145 lb (65.772 kg)  BMI 26.52 kg/m2  LMP 06/29/2011   FHT:  FHR: 140 bpm, variability: moderate,  accelerations:  Present,  decelerations:  Present occ variables UC:   irregular, every 2-5 minutes SVE:   Dilation: 1.5 Effacement (%): 30 Exam by:: Savan Ruta  Foley balloon placed without difficulty and inflated w 60cc of fluid   Assessment / Plan: small cervical change GBS pos  rcv'd PCN per protocol  Persistent ctx w decels and periods of decreased variability   Will start low dose pitocin   Fetal Wellbeing:  Category II Pain Control:  planning epidural   Dr Stefano Gaul updated   Malissa Hippo 04/16/2012, 8:39 AM

## 2012-04-16 NOTE — Progress Notes (Signed)
Patient ID: Jennifer Peters, female   DOB: 12/09/1990, 21 y.o.   MRN: [id number] .Subjective:  Pt has had some back pain, but overall has been resting  Objective: BP 98/63  Pulse 67  Temp 97.9 F (36.6 C) (Oral)  Resp 20  Ht 5\' 2"  (1.575 m)  Wt 145 lb (65.772 kg)  BMI 26.52 kg/m2  SpO2 100%  LMP 06/29/2011   FHT:  FHR: 140 bpm, variability: moderate,  accelerations:  Present,  decelerations:  Present early variables UC:   regular, every 2-4  minutes SVE:   Anterior lip/100/-1   IUPC pushed out slightly - removed FSE placed without difficulty   Pitocin at 32mu  Assessment / Plan: Cervical change despite inadequate ctx pattern Moderate early variables Will decrease pitocin to 10mu  Maternal Position changes to facilitate fetal rotation  Labor down   Epidural pca PRN       Fetal Wellbeing:  Category I and Category II Pain Control:  Epidural  Dr Stefano Gaul updated   Jennifer Peters 04/16/2012, 9:24 PM

## 2012-04-16 NOTE — Progress Notes (Signed)
Patient ID: Jennifer Peters, female   DOB: [birth year], 21 y.o.   MRN: [id number] .Subjective:  Pt now comfortable w epidural   Objective: BP 98/54  Pulse 81  Temp 98.1 F (36.7 C) (Oral)  Resp 18  Ht 5\' 2"  (1.575 Peters)  Wt 145 lb (65.772 kg)  BMI 26.52 kg/m2  SpO2 100%  LMP 06/29/2011   FHT:  FHR: 130 bpm, variability: moderate,  accelerations:  Present,  decelerations:  Present questionable 1 late decel,  UC:   toco had not been tracing well, but ctx have spaced some since epidural  SVE:   Dilation: 1.5 Effacement (%): 30 Exam by:: Jennifer Peters  Exam deferred   Pitocin at 2mu  Assessment / Plan: FHR overall reassuring  Foley balloon remains in place Will titrate pitocin    Fetal Wellbeing:  Category I and Category II Pain Control:  Epidural  Update physician PRN  Jennifer Peters 04/16/2012, 11:12 AM

## 2012-04-16 NOTE — MAU Note (Signed)
contractions 

## 2012-04-16 NOTE — Progress Notes (Signed)
Patient ID: Jennifer Peters, female   DOB: [birth year], 21 y.o.   MRN: [id number] .Subjective:  Pt remains comfortable w epidural, some pressure in R hip   Objective: BP 107/64  Pulse 72  Temp 98.6 F (37 C) (Oral)  Resp 20  Ht 5\' 2"  (1.575 m)  Wt 145 lb (65.772 kg)  BMI 26.52 kg/m2  SpO2 100%  LMP 06/29/2011   FHT:  FHR: 130 bpm, variability: moderate,  accelerations:  Present,  decelerations:  Absent UC:   regular, every 2-4 minutes SVE:   Dilation: 5 Effacement (%): 90 Station: -2 Exam by:: Chardonnay Holzmann,CNM  Pitocin at 20mu   Assessment / Plan: some cervical change, cervix stretchy, vtx LOA MVU's inadequate, although appears IUPC may not be tracing well Continue titrate pitocin    Fetal Wellbeing:  Category I Pain Control:  Epidural  Update physician PRN  Malissa Hippo 04/16/2012, 4:12 PM

## 2012-04-16 NOTE — H&P (Signed)
Jennifer Peters is a 21 y.o. female presenting for labor check w/ CC of ctxs since about 0100.  No VB or LOF.  GFM.  Denies UTI or PIH s/s.  No recent illness or fever.  No resp or GI c/o's.  Last ate around 2100.  Cx 1cm/50% per pt recall at her appt last week. Prenatal Course: Pt entered care for NOB transfer on 8/15 at approximately [redacted]w[redacted]d per EDC of 04/04/12.  She had received some care at [facility] prior to that but no u/s.  Pt had unsure LMP, and first u/s done on 01/23/12 for anatomy and dating showed EFW of <10% if [facility] 04/04/12 vs improper dating w/ best [facility] 04/23/12; normal doppler studies at that time.  F/u u/s 01/30/12 w/ 8/8 BPP and cerebellum measuring 29 weeks, c/w EDC 11/20, so thought improper dating.  As pregnancy continued however, EFW continued to display IUGR around or less than 10%.  She continued w/ weekly fetal testing and biweekly growth scans.  Dopplers have all been normal.  At [redacted]w[redacted]d, fetus noted to be breech.  On 03/28/12, pt referred to MFM for consult r/e best EDC, if ECV acceptable, and timing of delivery.  MFM rec'd change of EDC to 11/20, and to continue antenatal testing and await spontaneous labor.    OB Hx: G1=SAB '09 with D&E at 13 weeks G2=SVD '11 at 42 weeks F=5+9 (NICU for "9 hrs") G3=current  Maternal Medical History:  Reason for admission: Reason for admission: contractions.  Contractions: Onset was 3-5 hours ago.   Frequency: regular.   Perceived severity is strong.    Fetal activity: Perceived fetal activity is normal.   Last perceived fetal movement was within the past hour.      OB History    Grav Para Term Preterm Abortions TAB SAB Ect Mult Living   4 1 1  1  1   1      Past Medical History  Diagnosis Date  . No pertinent past medical history   . Anemia   . Post partum depression     Advanced FOB age 68  . Abnormal Pap smear of cervix     unknown stage of abnormality  . Sickle cell trait     ? FH of [place] trait  . Hx of chlamydia infection    Past  Surgical History  Procedure Date  . Dilation and curettage of uterus   . Wisdom tooth extracted x1 in 04/2011    Family History: family history includes Diabetes in her maternal aunt; Heart disease in her maternal grandmother; Hypertension in her mother; and Kidney disease in her maternal grandmother.  There is no history of Other. Social History:  reports that she quit smoking about 7 months ago. She has never used smokeless tobacco. She reports that she does not drink alcohol or use illicit drugs.   Prenatal Transfer Tool  Maternal Diabetes: No Genetic Screening: Declined Maternal Ultrasounds/Referrals: Abnormal:  Findings:   IUGR Fetal Ultrasounds or other Referrals:  Referred to Materal Fetal Medicine  Maternal Substance Abuse:  No Significant Maternal Medications:  None Significant Maternal Lab Results:  Lab values include: Group B Strep positive Other Comments:  None  Review of Systems  Constitutional: Negative.   HENT: Negative.   Eyes: Negative.   Respiratory: Negative.   Cardiovascular: Negative.   Gastrointestinal: Negative.   Genitourinary: Negative.   Skin: Negative.   Neurological: Negative.     Dilation: 1 Effacement (%): 40 Exam by:: Kingsley Farace,CNM Blood pressure 119/62,  pulse 80, last menstrual period 06/29/2011. Maternal Exam:  Uterine Assessment: Contraction strength is moderate.  Contraction frequency is irregular.   Abdomen: Patient reports no abdominal tenderness. Estimated fetal weight is 5.5-6.5 lbs.   Fetal presentation: vertex  Introitus: Normal vulva. Pelvis: adequate for delivery.   Cervix: Cervix evaluated by digital exam.     Fetal Exam Fetal Monitor Review: Mode: ultrasound.   Baseline rate: 140.  Variability: moderate (6-25 bpm).   Pattern: late decelerations.    Fetal State Assessment: Category II - tracings are indeterminate.     Physical Exam  Constitutional: She is oriented to person, place, and time. She appears well-developed  and well-nourished. She appears distressed.       Tearful at times w/ ctxs  HENT:  Head: Normocephalic and atraumatic.  Eyes: Pupils are equal, round, and reactive to light.  Cardiovascular: Normal rate.   Respiratory: Effort normal.  GI: Soft.       gravid  Genitourinary:       Tight 1 internal os, ext os/ 30%/-2, posterior, questionable OP  Musculoskeletal: She exhibits no edema.  Neurological: She is alert and oriented to person, place, and time. She has normal reflexes.  Skin: Skin is warm and dry.  Psychiatric: She has a normal mood and affect. Her behavior is normal. Judgment and thought content normal.    Prenatal labs: ABO, Rh: A/POS/-- (08/14 1204) Antibody: NEG (08/14 1204) Rubella: 66.5 (08/14 1204) RPR: NON REAC (08/14 1204)  HBsAg: NEGATIVE (08/14 1204)  HIV: NON REACTIVE (08/14 1204)  GBS: POSITIVE (10/03 1432)  1hr gtt=70 Pap:  Neg on 01/17/12  Assessment/Plan: 1.  39 weeks 2.  Late decelerations/Cat II FHT 3.  Questionable early labor despite no cervical change 4.  GBS pos 5.  EDC changed around 36 weeks by MFM (was initially thought to have IUGR)  1.  Admit to [facility] w/ Dr. Su Hilt as attending 2.  Initially had thought pt would be d/c'd home b/c of dilatation, but secondary to recurring decelerations despite interventions, pt now being admitted 3.  Routine L&D orders 4.  PCN per GBS protocol 5.  Epidural prn 6.  Will observe FHT for now, and will likely proceed w/ induction w/ Pitocin if FHT stable 7.  Offered primary c/s to pt in light of unfavorable cervix and likely need to induce to achieve vaginal delivery, and possible difficulty of this because of FHT.  R/b/a rev'd w/ pt, and at present, she desires to try for vaginal delivery.   8.  C/w MD prn, and CTO closely   Jedi Catalfamo H 04/16/2012, 6:40 AM

## 2012-04-17 LAB — CBC
HCT: 25.5 % — ABNORMAL LOW (ref 36.0–46.0)
Hemoglobin: 8.4 g/dL — ABNORMAL LOW (ref 12.0–15.0)
MCH: 34.4 pg — ABNORMAL HIGH (ref 26.0–34.0)
MCV: 104.5 fL — ABNORMAL HIGH (ref 78.0–100.0)
RBC: 2.44 MIL/uL — ABNORMAL LOW (ref 3.87–5.11)

## 2012-04-17 NOTE — Anesthesia Postprocedure Evaluation (Signed)
  Anesthesia Post-op Note  Patient: Jennifer Peters  Procedure(s) Performed: * No procedures listed *  Patient Location: Mother/Baby  Anesthesia Type:Epidural  Level of Consciousness: awake and alert   Airway and Oxygen Therapy: Patient Spontanous Breathing  Post-op Pain: none  Post-op Assessment: Patient's Cardiovascular Status Stable, Respiratory Function Stable, Patent Airway, No signs of Nausea or vomiting, Adequate PO intake, Pain level controlled, No headache, No backache, No residual numbness and No residual motor weakness  Post-op Vital Signs: Reviewed and stable  Complications: No apparent anesthesia complications
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
## 2012-04-17 NOTE — Progress Notes (Signed)
S: comfortable, little bleeding, slept some     Bottlefeeding, wanted BTL has not signed consent plans office circumcision O BP 115/76  Pulse 65  Temp 98.5 F (36.9 C) (Oral)  Resp 18  Ht 5\' 2"  (1.575 m)  Wt 65.772 kg (145 lb)  BMI 26.52 kg/m2  SpO2 100%  LMP 06/29/2011  Breastfeeding? Unknown     abd soft, nt, ff      sm  Flow perineum clean intact     -Homans sign bilaterally,       No Edema    Component Value Date/Time   HGB 8.4* 04/17/2012 0550   HCT 25.5* 04/17/2012 0550   A normal involution     Lactating     PP day 1     anemia P orthostatics pending, discussed frequent voids, slow positon changes, iron and diet, considering mirena IUD, continue care Lavera Guise, CNM

## 2012-04-17 NOTE — Progress Notes (Signed)
UR chart review completed.  

## 2012-04-18 MED ORDER — IBUPROFEN 600 MG PO TABS: 600.0000 mg | ORAL_TABLET | Freq: Four times a day (QID) | ORAL | Status: DC | PRN

## 2012-04-18 MED ORDER — FERROUS SULFATE 325 (65 FE) MG PO TABS: 325.0000 mg | ORAL_TABLET | Freq: Every day | ORAL | Status: DC

## 2012-04-18 MED ORDER — OXYCODONE-ACETAMINOPHEN 5-325 MG PO TABS: 1.0000 | ORAL_TABLET | ORAL | Status: DC | PRN

## 2012-04-18 MED ORDER — SENNOSIDES-DOCUSATE SODIUM 8.6-50 MG PO TABS: 2.0000 | ORAL_TABLET | Freq: Every day | ORAL | Status: DC

## 2012-04-18 NOTE — Discharge Summary (Signed)
Physician Discharge Summary  Patient ID: Jennifer Peters MRN: [id number] DOB/AGE: [birth year] 21 y.o.  Admit date: 04/16/2012 Discharge date: 04/18/2012  Admission Diagnoses: [redacted]w[redacted]d induction   Discharge Diagnoses:  Principal Problem:  *NSVD (normal spontaneous vaginal delivery) Active Problems:  Anemia   Discharged Condition: stable  Hospital Course: [redacted]w[redacted]d induction for concern with EFM strip, SVD, intact, normal involution, anemia, plans Mirena IUD  Consults: None  Significant Diagnostic Studies: labs:  Hemoglobin & Hematocrit     Component Value Date/Time   HGB 8.4* 04/17/2012 0550   HCT 25.5* 04/17/2012 0550      Treatments: IV hydration  Discharge Exam: Blood pressure 109/71, pulse 62, temperature 98.5 F (36.9 C), temperature source Oral, resp. rate 18, height 5\' 2"  (1.575 m), weight 65.772 kg (145 lb), last menstrual period 06/29/2011, SpO2 100.00%, unknown if currently breastfeeding. General appearance: no distress S: comfortable, little bleeding, slept well, denie pp depression just the baby blues     breastfeeding O BP 109/71  Pulse 62  Temp 98.5 F (36.9 C) (Oral)  Resp 18  Ht 5\' 2"  (1.575 m)  Wt 65.772 kg (145 lb)  BMI 26.52 kg/m2  SpO2 100%  LMP 06/29/2011  Breastfeeding? Unknown     abd soft, nt, ff      sm  Flow perineum clean intact     -Homans sign bilaterally,      No Edema    Component Value Date/Time   HGB 8.4* 04/17/2012 0550   HCT 25.5* 04/17/2012 0550    Disposition: 01-Home or Self Care  Discharge Orders    Future Orders Please Complete By Expires   Discharge patient          Medication List     As of 04/18/2012  9:00 AM    TAKE these medications         acetaminophen 500 MG tablet   Commonly known as: TYLENOL   Take 500 mg by mouth every 6 (six) hours as needed. For pain      Ferralet 90 90-1 MG Tabs   Take 1 capsule by mouth once.      ferrous sulfate 325 (65 FE) MG tablet   Take 1 tablet (325 mg total) by mouth  daily.      ibuprofen 600 MG tablet   Commonly known as: ADVIL,MOTRIN   Take 1 tablet (600 mg total) by mouth every 6 (six) hours as needed for pain.      oxyCODONE-acetaminophen 5-325 MG per tablet   Commonly known as: PERCOCET/ROXICET   Take 1 tablet by mouth every 4 (four) hours as needed (moderate - severe pain).      prenatal multivitamin Tabs   Take 1 tablet by mouth daily.      senna-docusate 8.6-50 MG per tablet   Commonly known as: Senokot-S   Take 2 tablets by mouth at bedtime.      zolpidem 5 MG tablet   Commonly known as: AMBIEN   Take 1 tablet (5 mg total) by mouth once.           Follow-up Information    Follow up with [facility]. In 5 weeks. (or call as needed with any questions or concerns)    Contact information:   [address] [place] [address] [phone]       reviewed s/s pp depression to report to ED call provider, offered zoloft and declines, discussed anemia, risk of constipation and measures  Signed: [facility], [name]  04/18/2012, 9:00 AM

## 2012-05-15 ENCOUNTER — Encounter: Payer: Medicaid Other | Admitting: Obstetrics and Gynecology

## 2012-05-19 ENCOUNTER — Encounter: Payer: Medicaid Other | Admitting: Obstetrics and Gynecology

## 2012-06-12 ENCOUNTER — Encounter: Payer: Self-pay | Admitting: Obstetrics and Gynecology

## 2012-06-12 ENCOUNTER — Other Ambulatory Visit: Payer: Medicaid Other

## 2012-06-12 ENCOUNTER — Other Ambulatory Visit: Payer: Self-pay

## 2012-06-12 ENCOUNTER — Ambulatory Visit (INDEPENDENT_AMBULATORY_CARE_PROVIDER_SITE_OTHER): Payer: Medicaid Other | Admitting: Obstetrics and Gynecology

## 2012-06-12 DIAGNOSIS — Z309 Encounter for contraceptive management, unspecified: Secondary | ICD-10-CM

## 2012-06-12 DIAGNOSIS — Z3009 Encounter for other general counseling and advice on contraception: Secondary | ICD-10-CM

## 2012-06-12 MED ORDER — MEDROXYPROGESTERONE ACETATE 150 MG/ML IM SUSP
150.0000 mg | Freq: Once | INTRAMUSCULAR | Status: AC
  Administered 2012-06-12: 150 mg via INTRAMUSCULAR

## 2012-06-12 MED ORDER — MEDROXYPROGESTERONE ACETATE 150 MG/ML IM SUSP: 150.0000 mg | Freq: Once | INTRAMUSCULAR | Status: DC

## 2012-06-12 NOTE — Progress Notes (Signed)
UPT : Negative  Pt states she has not had any unprotected intercourse x 2 weeks.  Depo Provera was called into [facility] on [address] so pt can receive Depo injection today. Per AVS

## 2012-06-12 NOTE — Progress Notes (Unsigned)
Next Depo due 09-03-2012

## 2012-06-12 NOTE — Addendum Note (Signed)
Addended by: Tim Lair on: 06/12/2012 09:30 AM   Modules accepted: Orders

## 2012-06-12 NOTE — Addendum Note (Signed)
Addended by: Tim Lair on: 06/12/2012 09:19 AM   Modules accepted: Orders

## 2012-06-12 NOTE — Progress Notes (Signed)
Subjective:     Jennifer Peters is a 22 y.o. female who presents for a postpartum visit. See information above.    Patient is sexually active.   The following portions of the patient's history were reviewed and updated as appropriate: allergies, current medications, past family history, past medical history, past social history, past surgical history and problem list.  Review of Systems Pertinent items are noted in HPI.   Objective:    BP 100/64  Temp 97.3 F (36.3 C)  Ht 5\' 2"  (1.575 m)  Wt 123 lb (55.792 kg)  BMI 22.50 kg/m2  LMP 05/26/2012  General:  alert, cooperative and no distress     Lungs: clear to auscultation bilaterally  Heart:  regular rate and rhythm, S1, S2 normal, no murmur  Abdomen: soft, non-tender; bowel sounds normal; no masses,  no organomegaly   Vulva:  normal  Vagina: normal vagina  Cervix:  normal  Corpus: normal size, contour, position, consistency, mobility, non-tender  Adnexa:  normal adnexa        Cesarean section incision well healed: n/a       Assessment:     Normal postpartum exam.  Pap smear not done at today's visit.   Plan:     1. Contraception: Depo-Provera injections.150 mg q 12 weeks. 2. EPDS: 1. 3. Follow up in: 6 months or as needed. 4. Breast Feeding: No. 5. Return to work, exercising, and a normal activities: yes.     Janine Limbo MD 06/12/2012 9:01 AM    Date of delivery: 04/16/2012 Female Name: Jennifer Peters Vaginal delivery:yes Cesarean section:no Tubal ligation:no GDM:no Breast Feeding:no Bottle Feeding:yes Post-Partum Blues:no Abnormal pap:yes Normal GU function: yes Normal GI function:yes Returning to work:no EPDS: Score 1

## 2012-06-14 ENCOUNTER — Observation Stay (HOSPITAL_COMMUNITY)
Admission: EM | Admit: 2012-06-14 | Discharge: 2012-06-15 | Disposition: A | Discharge status: Home / Self Care | Payer: Medicaid Other | Attending: General Surgery | Admitting: General Surgery

## 2012-06-14 ENCOUNTER — Encounter (HOSPITAL_COMMUNITY): Payer: Self-pay | Admitting: Certified Registered"

## 2012-06-14 ENCOUNTER — Encounter (HOSPITAL_COMMUNITY): Payer: Self-pay

## 2012-06-14 ENCOUNTER — Encounter (HOSPITAL_COMMUNITY): Admission: EM | Disposition: A | Discharge status: Home / Self Care | Payer: Self-pay | Source: Home / Self Care

## 2012-06-14 ENCOUNTER — Inpatient Hospital Stay (HOSPITAL_COMMUNITY): Payer: Medicaid Other | Admitting: Certified Registered"

## 2012-06-14 ENCOUNTER — Emergency Department (HOSPITAL_COMMUNITY): Payer: Medicaid Other

## 2012-06-14 DIAGNOSIS — R1031 Right lower quadrant pain: Secondary | ICD-10-CM | POA: Insufficient documentation

## 2012-06-14 DIAGNOSIS — K3580 Unspecified acute appendicitis: Principal | ICD-10-CM | POA: Insufficient documentation

## 2012-06-14 DIAGNOSIS — N1330 Unspecified hydronephrosis: Secondary | ICD-10-CM | POA: Insufficient documentation

## 2012-06-14 HISTORY — PX: LAPAROSCOPIC APPENDECTOMY: SHX408

## 2012-06-14 LAB — COMPREHENSIVE METABOLIC PANEL
AST: 14 U/L (ref 0–37)
Albumin: 4.2 g/dL (ref 3.5–5.2)
BUN: 16 mg/dL (ref 6–23)
Chloride: 105 mEq/L (ref 96–112)
Creatinine, Ser: 0.74 mg/dL (ref 0.50–1.10)
Potassium: 3.4 mEq/L — ABNORMAL LOW (ref 3.5–5.1)
Total Bilirubin: 0.3 mg/dL (ref 0.3–1.2)
Total Protein: 7.3 g/dL (ref 6.0–8.3)

## 2012-06-14 LAB — CBC
HCT: 32.3 % — ABNORMAL LOW (ref 36.0–46.0)
Hemoglobin: 10.7 g/dL — ABNORMAL LOW (ref 12.0–15.0)
RDW: 11.9 % (ref 11.5–15.5)
WBC: 14.8 10*3/uL — ABNORMAL HIGH (ref 4.0–10.5)

## 2012-06-14 LAB — CREATININE, SERUM
Creatinine, Ser: 0.66 mg/dL (ref 0.50–1.10)
GFR calc Af Amer: >90 mL/min (ref >90)
GFR calc non Af Amer: >90 mL/min (ref >90)

## 2012-06-14 LAB — SURGICAL PCR SCREEN
MRSA, PCR: NEGATIVE
Staphylococcus aureus: NEGATIVE

## 2012-06-14 LAB — URINALYSIS, MICROSCOPIC ONLY
Bilirubin Urine: NEGATIVE
Glucose, UA: NEGATIVE mg/dL
Hgb urine dipstick: NEGATIVE
Ketones, ur: >80 mg/dL — AB
Leukocytes, UA: NEGATIVE
Protein, ur: 100 mg/dL — AB
pH: 6 (ref 5.0–8.0)

## 2012-06-14 LAB — CBC WITH DIFFERENTIAL/PLATELET
Eosinophils Absolute: 0.1 10*3/uL (ref 0.0–0.7)
Eosinophils Relative: 1 % (ref 0–5)
HCT: 33.9 % — ABNORMAL LOW (ref 36.0–46.0)
Lymphocytes Relative: 12 % (ref 12–46)
Lymphs Abs: 1.8 10*3/uL (ref 0.7–4.0)
MCH: 32.4 pg (ref 26.0–34.0)
MCV: 97.1 fL (ref 78.0–100.0)
Monocytes Absolute: 1.2 10*3/uL — ABNORMAL HIGH (ref 0.1–1.0)
Monocytes Relative: 8 % (ref 3–12)
RBC: 3.49 MIL/uL — ABNORMAL LOW (ref 3.87–5.11)
WBC: 14.3 10*3/uL — ABNORMAL HIGH (ref 4.0–10.5)

## 2012-06-14 LAB — POCT I-STAT, CHEM 8
BUN: 17 mg/dL (ref 6–23)
Chloride: 109 mEq/L (ref 96–112)
Glucose, Bld: 121 mg/dL — ABNORMAL HIGH (ref 70–99)
HCT: 38 % (ref 36.0–46.0)
Hemoglobin: 12.9 g/dL (ref 12.0–15.0)
Potassium: 3.2 mEq/L — ABNORMAL LOW (ref 3.5–5.1)
TCO2: 22 mmol/L (ref 0–100)

## 2012-06-14 LAB — POCT I-STAT TROPONIN I: Troponin i, poc: 0 ng/mL (ref 0.00–0.08)

## 2012-06-14 LAB — LIPASE, BLOOD: Lipase: 24 U/L (ref 11–59)

## 2012-06-14 LAB — POCT PREGNANCY, URINE: Preg Test, Ur: NEGATIVE

## 2012-06-14 SURGERY — APPENDECTOMY, LAPAROSCOPIC
Anesthesia: General

## 2012-06-14 MED ORDER — OXYCODONE HCL 5 MG/5ML PO SOLN: 5.0000 mg | Freq: Once | ORAL | Status: AC | PRN

## 2012-06-14 MED ORDER — ROCURONIUM BROMIDE 100 MG/10ML IV SOLN
INTRAVENOUS | Status: DC | PRN
  Administered 2012-06-14: 20 mg via INTRAVENOUS

## 2012-06-14 MED ORDER — ONDANSETRON HCL 4 MG/2ML IJ SOLN: 4.0000 mg | Freq: Four times a day (QID) | INTRAMUSCULAR | Status: DC | PRN

## 2012-06-14 MED ORDER — HYDROMORPHONE HCL PF 1 MG/ML IJ SOLN
0.5000 mg | Freq: Once | INTRAMUSCULAR | Status: AC
  Administered 2012-06-14: 0.5 mg via INTRAVENOUS
  Filled 2012-06-14: qty 1

## 2012-06-14 MED ORDER — DEXTROSE 5 % IV SOLN
2.0000 g | INTRAVENOUS | Status: AC
  Administered 2012-06-14: 2 g via INTRAVENOUS
  Filled 2012-06-14: qty 2

## 2012-06-14 MED ORDER — LACTATED RINGERS IV SOLN
INTRAVENOUS | Status: DC | PRN
  Administered 2012-06-14 (×2): via INTRAVENOUS

## 2012-06-14 MED ORDER — ONDANSETRON HCL 4 MG/2ML IJ SOLN
INTRAMUSCULAR | Status: DC | PRN
  Administered 2012-06-14: 4 mg via INTRAVENOUS

## 2012-06-14 MED ORDER — BUPIVACAINE-EPINEPHRINE 0.5% -1:200000 IJ SOLN
INTRAMUSCULAR | Status: DC | PRN
  Administered 2012-06-14: 7 mL

## 2012-06-14 MED ORDER — OXYCODONE-ACETAMINOPHEN 5-325 MG PO TABS
1.0000 | ORAL_TABLET | ORAL | Status: DC | PRN
  Administered 2012-06-14 – 2012-06-15 (×4): 2 via ORAL
  Filled 2012-06-14 (×4): qty 2

## 2012-06-14 MED ORDER — SODIUM CHLORIDE 0.9 % IV SOLN: INTRAVENOUS | Status: DC

## 2012-06-14 MED ORDER — KETOROLAC TROMETHAMINE 30 MG/ML IJ SOLN
30.0000 mg | Freq: Once | INTRAMUSCULAR | Status: AC
  Administered 2012-06-14: 30 mg via INTRAVENOUS

## 2012-06-14 MED ORDER — SUCCINYLCHOLINE CHLORIDE 20 MG/ML IJ SOLN
INTRAMUSCULAR | Status: DC | PRN
  Administered 2012-06-14: 100 mg via INTRAVENOUS

## 2012-06-14 MED ORDER — NEOSTIGMINE METHYLSULFATE 1 MG/ML IJ SOLN
INTRAMUSCULAR | Status: DC | PRN
  Administered 2012-06-14: 3 mg via INTRAVENOUS

## 2012-06-14 MED ORDER — PROPOFOL 10 MG/ML IV BOLUS
INTRAVENOUS | Status: DC | PRN
  Administered 2012-06-14: 200 mg via INTRAVENOUS

## 2012-06-14 MED ORDER — METOCLOPRAMIDE HCL 5 MG/ML IJ SOLN
10.0000 mg | Freq: Once | INTRAMUSCULAR | Status: AC
  Administered 2012-06-14: 10 mg via INTRAVENOUS
  Filled 2012-06-14: qty 2

## 2012-06-14 MED ORDER — MEPERIDINE HCL 25 MG/ML IJ SOLN: 6.2500 mg | INTRAMUSCULAR | Status: DC | PRN

## 2012-06-14 MED ORDER — DEXTROSE 5 % IV SOLN
2.0000 g | INTRAVENOUS | Status: DC | PRN
  Administered 2012-06-14: 2 g via INTRAVENOUS

## 2012-06-14 MED ORDER — KETOROLAC TROMETHAMINE 30 MG/ML IJ SOLN
INTRAMUSCULAR | Status: AC
  Administered 2012-06-14: 30 mg via INTRAVENOUS
  Filled 2012-06-14: qty 1

## 2012-06-14 MED ORDER — MORPHINE SULFATE 4 MG/ML IJ SOLN
4.0000 mg | Freq: Once | INTRAMUSCULAR | Status: AC
  Administered 2012-06-14: 4 mg via INTRAVENOUS
  Filled 2012-06-14: qty 1

## 2012-06-14 MED ORDER — PROMETHAZINE HCL 25 MG/ML IJ SOLN: 6.2500 mg | INTRAMUSCULAR | Status: DC | PRN

## 2012-06-14 MED ORDER — IOHEXOL 300 MG/ML  SOLN
100.0000 mL | Freq: Once | INTRAMUSCULAR | Status: AC | PRN
  Administered 2012-06-14: 100 mL via INTRAVENOUS

## 2012-06-14 MED ORDER — ONDANSETRON HCL 4 MG/2ML IJ SOLN
4.0000 mg | Freq: Once | INTRAMUSCULAR | Status: AC
  Administered 2012-06-14: 4 mg via INTRAVENOUS
  Filled 2012-06-14: qty 2

## 2012-06-14 MED ORDER — HYDROMORPHONE HCL PF 1 MG/ML IJ SOLN
0.2500 mg | INTRAMUSCULAR | Status: DC | PRN
  Administered 2012-06-14 (×2): 0.5 mg via INTRAVENOUS

## 2012-06-14 MED ORDER — IOHEXOL 300 MG/ML  SOLN
20.0000 mL | INTRAMUSCULAR | Status: DC
  Administered 2012-06-14: 50 mL via ORAL

## 2012-06-14 MED ORDER — ENOXAPARIN SODIUM 40 MG/0.4ML ~~LOC~~ SOLN
40.0000 mg | SUBCUTANEOUS | Status: DC
  Administered 2012-06-15: 40 mg via SUBCUTANEOUS
  Filled 2012-06-14 (×2): qty 0.4

## 2012-06-14 MED ORDER — GLYCOPYRROLATE 0.2 MG/ML IJ SOLN
INTRAMUSCULAR | Status: DC | PRN
  Administered 2012-06-14: 0.4 mg via INTRAVENOUS

## 2012-06-14 MED ORDER — MIDAZOLAM HCL 5 MG/5ML IJ SOLN
INTRAMUSCULAR | Status: DC | PRN
  Administered 2012-06-14: 2 mg via INTRAVENOUS

## 2012-06-14 MED ORDER — SODIUM CHLORIDE 0.9 % IR SOLN
Status: DC | PRN
  Administered 2012-06-14: 1000 mL

## 2012-06-14 MED ORDER — MORPHINE SULFATE 2 MG/ML IJ SOLN: 1.0000 mg | INTRAMUSCULAR | Status: DC | PRN

## 2012-06-14 MED ORDER — IOHEXOL 300 MG/ML  SOLN: 20.0000 mL | INTRAMUSCULAR | Status: DC

## 2012-06-14 MED ORDER — DEXTROSE-NACL 5-0.9 % IV SOLN
INTRAVENOUS | Status: DC
  Administered 2012-06-14: 22:00:00 via INTRAVENOUS
  Filled 2012-06-14: qty 1000

## 2012-06-14 MED ORDER — DIPHENHYDRAMINE HCL 12.5 MG/5ML PO ELIX
12.5000 mg | ORAL_SOLUTION | Freq: Four times a day (QID) | ORAL | Status: DC | PRN
  Filled 2012-06-14: qty 10

## 2012-06-14 MED ORDER — IOHEXOL 300 MG/ML  SOLN: 50.0000 mL | Freq: Once | INTRAMUSCULAR | Status: DC | PRN

## 2012-06-14 MED ORDER — OXYCODONE HCL 5 MG PO TABS: 5.0000 mg | ORAL_TABLET | Freq: Once | ORAL | Status: AC | PRN

## 2012-06-14 MED ORDER — FENTANYL CITRATE 0.05 MG/ML IJ SOLN
INTRAMUSCULAR | Status: DC | PRN
  Administered 2012-06-14: 50 ug via INTRAVENOUS
  Administered 2012-06-14: 100 ug via INTRAVENOUS
  Administered 2012-06-14: 50 ug via INTRAVENOUS
  Administered 2012-06-14: 100 ug via INTRAVENOUS

## 2012-06-14 MED ORDER — HYDROMORPHONE HCL PF 1 MG/ML IJ SOLN
1.0000 mg | INTRAMUSCULAR | Status: DC | PRN
  Administered 2012-06-14: 1 mg via INTRAVENOUS
  Filled 2012-06-14: qty 1

## 2012-06-14 MED ORDER — SODIUM CHLORIDE 0.9 % IV BOLUS (SEPSIS)
1000.0000 mL | Freq: Once | INTRAVENOUS | Status: AC
  Administered 2012-06-14: 1000 mL via INTRAVENOUS

## 2012-06-14 MED ORDER — DIPHENHYDRAMINE HCL 50 MG/ML IJ SOLN: 12.5000 mg | Freq: Four times a day (QID) | INTRAMUSCULAR | Status: DC | PRN

## 2012-06-14 MED ORDER — MIDAZOLAM HCL 2 MG/2ML IJ SOLN: 0.5000 mg | Freq: Once | INTRAMUSCULAR | Status: DC | PRN

## 2012-06-14 MED ORDER — LIDOCAINE HCL (CARDIAC) 20 MG/ML IV SOLN
INTRAVENOUS | Status: DC | PRN
  Administered 2012-06-14: 20 mg via INTRAVENOUS

## 2012-06-14 MED ORDER — DEXTROSE 5 % IV SOLN
2.0000 g | Freq: Three times a day (TID) | INTRAVENOUS | Status: DC
  Administered 2012-06-14 – 2012-06-15 (×2): 2 g via INTRAVENOUS
  Filled 2012-06-14 (×5): qty 2

## 2012-06-14 MED ORDER — ONDANSETRON HCL 4 MG PO TABS: 4.0000 mg | ORAL_TABLET | Freq: Four times a day (QID) | ORAL | Status: DC | PRN

## 2012-06-14 SURGICAL SUPPLY — 38 items
APPLIER CLIP ROT 10 11.4 M/L (STAPLE)
BLADE SURG ROTATE 9660 (MISCELLANEOUS) IMPLANT
CANISTER SUCTION 2500CC (MISCELLANEOUS) ×2 IMPLANT
CHLORAPREP W/TINT 26ML (MISCELLANEOUS) ×2 IMPLANT
CLIP APPLIE ROT 10 11.4 M/L (STAPLE) IMPLANT
CLOTH BEACON ORANGE TIMEOUT ST (SAFETY) ×2 IMPLANT
COVER SURGICAL LIGHT HANDLE (MISCELLANEOUS) ×2 IMPLANT
CUTTER FLEX LINEAR 45M (STAPLE) IMPLANT
DERMABOND ADVANCED (GAUZE/BANDAGES/DRESSINGS) ×1
DERMABOND ADVANCED .7 DNX12 (GAUZE/BANDAGES/DRESSINGS) ×1 IMPLANT
DRAPE UTILITY 15X26 W/TAPE STR (DRAPE) ×4 IMPLANT
DRAPE WARM FLUID 44X44 (DRAPE) IMPLANT
ELECT REM PT RETURN 9FT ADLT (ELECTROSURGICAL) ×2
ELECTRODE REM PT RTRN 9FT ADLT (ELECTROSURGICAL) ×1 IMPLANT
ENDOLOOP SUT PDS II  0 18 (SUTURE)
ENDOLOOP SUT PDS II 0 18 (SUTURE) IMPLANT
GLOVE BIO SURGEON STRL SZ 6.5 (GLOVE) ×2 IMPLANT
GLOVE BIO SURGEON STRL SZ8 (GLOVE) ×2 IMPLANT
GLOVE BIOGEL PI IND STRL 8 (GLOVE) ×1 IMPLANT
GLOVE BIOGEL PI INDICATOR 8 (GLOVE) ×1
GOWN STRL NON-REIN LRG LVL3 (GOWN DISPOSABLE) ×4 IMPLANT
KIT BASIN OR (CUSTOM PROCEDURE TRAY) ×2 IMPLANT
KIT ROOM TURNOVER OR (KITS) ×2 IMPLANT
NS IRRIG 1000ML POUR BTL (IV SOLUTION) ×2 IMPLANT
PAD ARMBOARD 7.5X6 YLW CONV (MISCELLANEOUS) ×4 IMPLANT
POUCH SPECIMEN RETRIEVAL 10MM (ENDOMECHANICALS) ×2 IMPLANT
RELOAD STAPLE TA45 3.5 REG BLU (ENDOMECHANICALS) IMPLANT
SCALPEL HARMONIC ACE (MISCELLANEOUS) ×2 IMPLANT
SET IRRIG TUBING LAPAROSCOPIC (IRRIGATION / IRRIGATOR) ×2 IMPLANT
SPECIMEN JAR SMALL (MISCELLANEOUS) ×2 IMPLANT
SUT MON AB 4-0 PC3 18 (SUTURE) ×2 IMPLANT
TOWEL OR 17X24 6PK STRL BLUE (TOWEL DISPOSABLE) ×2 IMPLANT
TOWEL OR 17X26 10 PK STRL BLUE (TOWEL DISPOSABLE) ×2 IMPLANT
TRAY FOLEY CATH 14FR (SET/KITS/TRAYS/PACK) ×2 IMPLANT
TRAY LAPAROSCOPIC (CUSTOM PROCEDURE TRAY) ×2 IMPLANT
TROCAR XCEL BLADELESS 5X75MML (TROCAR) ×4 IMPLANT
TROCAR XCEL BLUNT TIP 100MML (ENDOMECHANICALS) ×2 IMPLANT
WATER STERILE IRR 1000ML POUR (IV SOLUTION) IMPLANT

## 2012-06-14 NOTE — H&P (Signed)
Jennifer Peters is an 22 y.o. female.   Chief Complaint: Abdominal pain, nausea, vomiting HPI: 22 yr old female who presented to [facility] with 2 day history of worsening abdominal pain with nausea and vomiting.  The pain became significantly worse this am and she came to the hospital.  She is 2 months post-partum from a vaginal delivery of a healthy baby boy.  Her pain and nausea is better now with medicine but movement worsens the pain.  Evaluation here showed appendicitis by CT.  We are asked to see the patient for surgical evaluation.   Past Medical History  Diagnosis Date  . No pertinent past medical history   . Anemia   . Post partum depression     Advanced FOB age 67  . Abnormal Pap smear of cervix     unknown stage of abnormality  . Sickle cell trait     ? FH of [place] trait  . Hx of chlamydia infection     Past Surgical History  Procedure Date  . Dilation and curettage of uterus   . Wisdom tooth extracted x1 in 04/2011     Family History  Problem Relation Age of Onset  . Other Neg Hx   . Diabetes Maternal Aunt   . Kidney disease Maternal Grandmother   . Heart disease Maternal Grandmother     chf  . Hypertension Mother    Social History:  reports that she quit smoking about 9 months ago. She has never used smokeless tobacco. She reports that she does not drink alcohol or use illicit drugs.  Allergies: No Known Allergies   (Not in a hospital admission)  Results for orders placed during the hospital encounter of 06/14/12 (from the past 48 hour(s))  LIPASE, BLOOD     Status: Normal   Collection Time   06/14/12  4:32 AM      Component Value Range Comment   Lipase 24  11 - 59 U/L   COMPREHENSIVE METABOLIC PANEL     Status: Abnormal   Collection Time   06/14/12  4:32 AM      Component Value Range Comment   Sodium 139  135 - 145 mEq/L    Potassium 3.4 (*) 3.5 - 5.1 mEq/L    Chloride 105  96 - 112 mEq/L    CO2 21  19 - 32 mEq/L    Glucose, Bld 138 (*) 70 - 99 mg/dL    BUN 16   6 - 23 mg/dL    Creatinine, Ser 1.61  0.50 - 1.10 mg/dL    Calcium 9.2  8.4 - 09.6 mg/dL    Total Protein 7.3  6.0 - 8.3 g/dL    Albumin 4.2  3.5 - 5.2 g/dL    AST 14  0 - 37 U/L    ALT 9  0 - 35 U/L    Alkaline Phosphatase 55  39 - 117 U/L    Total Bilirubin 0.3  0.3 - 1.2 mg/dL    GFR calc non Af Amer >90  >90 mL/min    GFR calc Af Amer >90  >90 mL/min   CBC WITH DIFFERENTIAL     Status: Abnormal   Collection Time   06/14/12  4:32 AM      Component Value Range Comment   WBC 14.3 (*) 4.0 - 10.5 K/uL    RBC 3.49 (*) 3.87 - 5.11 MIL/uL    Hemoglobin 11.3 (*) 12.0 - 15.0 g/dL    HCT 04.5 (*) 40.9 -  46.0 %    MCV 97.1  78.0 - 100.0 fL    MCH 32.4  26.0 - 34.0 pg    MCHC 33.3  30.0 - 36.0 g/dL    RDW 56.2  13.0 - 86.5 %    Platelets 244  150 - 400 K/uL    Neutrophils Relative 79 (*) 43 - 77 %    Neutro Abs 11.3 (*) 1.7 - 7.7 K/uL    Lymphocytes Relative 12  12 - 46 %    Lymphs Abs 1.8  0.7 - 4.0 K/uL    Monocytes Relative 8  3 - 12 %    Monocytes Absolute 1.2 (*) 0.1 - 1.0 K/uL    Eosinophils Relative 1  0 - 5 %    Eosinophils Absolute 0.1  0.0 - 0.7 K/uL    Basophils Relative 0  0 - 1 %    Basophils Absolute 0.0  0.0 - 0.1 K/uL   POCT I-STAT TROPONIN I     Status: Normal   Collection Time   06/14/12  4:45 AM      Component Value Range Comment   Troponin i, poc 0.00  0.00 - 0.08 ng/mL    Comment 3            POCT I-STAT, CHEM 8     Status: Abnormal   Collection Time   06/14/12  5:28 AM      Component Value Range Comment   Sodium 142  135 - 145 mEq/L    Potassium 3.2 (*) 3.5 - 5.1 mEq/L    Chloride 109  96 - 112 mEq/L    BUN 17  6 - 23 mg/dL    Creatinine, Ser 7.84  0.50 - 1.10 mg/dL    Glucose, Bld 696 (*) 70 - 99 mg/dL    Calcium, Ion 2.95  2.84 - 1.23 mmol/L    TCO2 22  0 - 100 mmol/L    Hemoglobin 12.9  12.0 - 15.0 g/dL    HCT 13.2  44.0 - 10.2 %   URINALYSIS, MICROSCOPIC ONLY     Status: Abnormal   Collection Time   06/14/12  5:51 AM      Component Value Range  Comment   Color, Urine AMBER (*) YELLOW BIOCHEMICALS MAY BE AFFECTED BY COLOR   APPearance CLOUDY (*) CLEAR    Specific Gravity, Urine 1.034 (*) 1.005 - 1.030    pH 6.0  5.0 - 8.0    Glucose, UA NEGATIVE  NEGATIVE mg/dL    Hgb urine dipstick NEGATIVE  NEGATIVE    Bilirubin Urine NEGATIVE  NEGATIVE    Ketones, ur >80 (*) NEGATIVE mg/dL    Protein, ur 725 (*) NEGATIVE mg/dL    Urobilinogen, UA 1.0  0.0 - 1.0 mg/dL    Nitrite NEGATIVE  NEGATIVE    Leukocytes, UA NEGATIVE  NEGATIVE    Bacteria, UA MANY (*) RARE    Squamous Epithelial / LPF MANY (*) RARE    Urine-Other MUCOUS PRESENT     POCT PREGNANCY, URINE     Status: Normal   Collection Time   06/14/12  5:58 AM      Component Value Range Comment   Preg Test, Ur NEGATIVE  NEGATIVE    Ct Abdomen Pelvis W Contrast  06/14/2012  *RADIOLOGY REPORT*  Clinical Data: Right lower quadrant pain for 2 days  CT ABDOMEN AND PELVIS WITH CONTRAST  Technique:  Multidetector CT imaging of the abdomen and pelvis was performed following the  standard protocol during bolus administration of intravenous contrast.  Contrast: OMNIPAQUE IOHEXOL 300 MG/ML  SOLN  Comparison: None.  Findings:  The lung bases are clear.  No pericardial or pleural effusion.  No focal liver abnormality.  Gallbladder is normal.  There is no biliary dilatation. Normal appearance of the pancreas.  The spleen is negative.  The adrenal glands are both unremarkable.  Bilateral pelvocaliectasis is noted right greater than left.  Dilatation of the mid right ureter noted.  The urinary bladder appears normal.  The uterine cavity appears distended and filled with fluid.  Normal caliber of the abdominal aorta.  There is no enlarged upper abdominal or pelvic lymph nodes.  No inguinal adenopathy.  No free fluid or fluid collection  The stomach and the small bowel loops are normal.  The appendix is visualized in the right lower quadrant of the abdomen and appears thickened measuring 1 cm.  There is  mild periappendiceal free fluid.  No periappendiceal abscess identified. Moderate stool is identified within the sigmoid colon and rectum.  Review of the visualized osseous structures is unremarkable.  IMPRESSION:  1.  Examination is positive for acute appendicitis. 2.  Bilateral pelvocaliectasis   Original Report Authenticated By: Signa Kell, M.D.     Review of Systems  Constitutional: Negative.   HENT: Negative.   Eyes: Negative.   Respiratory: Negative.   Cardiovascular: Negative.   Gastrointestinal: Positive for nausea, vomiting and abdominal pain. Negative for diarrhea.  Genitourinary: Negative.   Musculoskeletal: Negative.   Skin: Negative.   Neurological: Negative.   Endo/Heme/Allergies: Negative.   Psychiatric/Behavioral: Negative.     Blood pressure 114/74, pulse 91, temperature 97.8 F (36.6 C), temperature source Oral, resp. rate 22, last menstrual period 05/26/2012, SpO2 97.00%. Physical Exam  Constitutional: She is oriented to person, place, and time. She appears well-developed and well-nourished. No distress.  HENT:  Head: Normocephalic and atraumatic.  Eyes: Conjunctivae normal are normal. Pupils are equal, round, and reactive to light.  Neck: Normal range of motion. Neck supple.  Cardiovascular: Normal rate and regular rhythm.   Respiratory: Effort normal and breath sounds normal.  GI: Bowel sounds are normal. There is tenderness (RLQ and flank). There is rebound and guarding.  Genitourinary:       Deferred   Musculoskeletal: Normal range of motion.  Neurological: She is alert and oriented to person, place, and time.  Skin: Skin is warm and dry.  Psychiatric: She has a normal mood and affect. Her behavior is normal.     Assessment/Plan 1. Acute appendicitis: the patient has appendicitis by CT and symptoms consistent with that.  She will be scheduled today for emergency laparoscopic appendectomy.  RBA discussed with the patient who expresses understanding and  wishes to proceed.  The patient will be taken to pre-op holding and then admitted to the hospital after surgery.  We will start cefoxitin pre-op and keep her comfortable with antiemetics and pain meds.  Dr. Luisa Hart will see the patient in pre-op holding.  WHITE, ELIZABETH 06/14/2012, 11:42 AM

## 2012-06-14 NOTE — ED Provider Notes (Signed)
History     CSN: [id number]  Arrival date & time 06/14/12  0423   First MD Initiated Contact with Patient 06/14/12 [phone]      Chief Complaint  Patient presents with  . Abdominal Pain    (Consider location/radiation/quality/duration/timing/severity/associated sxs/prior treatment) HPI Comments: 22 yo female, 2 months post-partum normal vaginal delivery, non-breast-feeding, presents with sudden abdominal pain since 7am this morning. Pt stated pain has escalated from 5/10 to 10/10. She tried taking ibuprofen with no relief. Nothing makes it better or worse. Pain is right sided and radiates to umbilicus.   Pt admits nausea, vomiting green/yellow contents. Pt denies fever, chest pain, shortness of breath, flank pain, dysuria, hematuria, constipation/diarrhea, or back pain.  Pt has no significant medical history. No history of abdominal surgery.   Patient is a 22 y.o. female presenting with abdominal pain.  Abdominal Pain The primary symptoms of the illness include abdominal pain, nausea and vomiting. The primary symptoms of the illness do not include fever, shortness of breath, diarrhea, dysuria or vaginal discharge.  Symptoms associated with the illness do not include diaphoresis, constipation or hematuria.    Past Medical History  Diagnosis Date  . No pertinent past medical history   . Anemia   . Post partum depression     Advanced FOB age 74  . Abnormal Pap smear of cervix     unknown stage of abnormality  . Sickle cell trait     ? FH of  trait  . Hx of chlamydia infection     Past Surgical History  Procedure Date  . Dilation and curettage of uterus   . Wisdom tooth extracted x1 in 04/2011     Family History  Problem Relation Age of Onset  . Other Neg Hx   . Diabetes Maternal Aunt   . Kidney disease Maternal Grandmother   . Heart disease Maternal Grandmother     chf  . Hypertension Mother     History  Substance Use Topics  . Smoking status: Former Smoker -- 0.5  packs/day for 3 years    Quit date: 08/21/2011  . Smokeless tobacco: Never Used  . Alcohol Use: No    OB History    Grav Para Term Preterm Abortions TAB SAB Ect Mult Living   4 2 2  1  1   2       Review of Systems  Constitutional: Negative for fever and diaphoresis.  HENT: Negative for neck pain and neck stiffness.   Eyes: Negative for visual disturbance.  Respiratory: Negative for apnea, chest tightness and shortness of breath.   Cardiovascular: Negative for chest pain and palpitations.  Gastrointestinal: Positive for nausea, vomiting and abdominal pain. Negative for diarrhea and constipation.  Genitourinary: Negative for dysuria, hematuria, vaginal discharge and vaginal pain.  Musculoskeletal: Negative for gait problem.  Skin: Negative for rash.  Neurological: Negative for dizziness, weakness, light-headedness, numbness and headaches.  All other systems reviewed and are negative.    Allergies  Review of patient's allergies indicates no known allergies.  Home Medications   Current Outpatient Rx  Name  Route  Sig  Dispense  Refill  . ACETAMINOPHEN 500 MG PO TABS   Oral   Take 500 mg by mouth every 6 (six) hours as needed. For pain         . FERRALET 90 90-1 MG PO TABS   Oral   Take 1 capsule by mouth once.   30 each   4   . FERROUS  SULFATE 325 (65 FE) MG PO TABS   Oral   Take 1 tablet (325 mg total) by mouth daily.   30 tablet   1   . IBUPROFEN 600 MG PO TABS   Oral   Take 1 tablet (600 mg total) by mouth every 6 (six) hours as needed for pain.   30 tablet   1   . MEDROXYPROGESTERONE ACETATE 150 MG/ML IM SUSP   Intramuscular   Inject 1 mL (150 mg total) into the muscle once.   1 mL   4   . OXYCODONE-ACETAMINOPHEN 5-325 MG PO TABS   Oral   Take 1 tablet by mouth every 4 (four) hours as needed (moderate - severe pain).   30 tablet   0   . PRENATAL MULTIVITAMIN CH   Oral   Take 1 tablet by mouth daily.   90 tablet   3   . SENNOSIDES-DOCUSATE  SODIUM 8.6-50 MG PO TABS   Oral   Take 2 tablets by mouth at bedtime.   60 tablet   1   . ZOLPIDEM TARTRATE 5 MG PO TABS   Oral   Take 1 tablet (5 mg total) by mouth once.   20 tablet   0     BP 127/89  Pulse 83  Temp 97.8 F (36.6 C) (Oral)  Resp 18  SpO2 100%  LMP 05/26/2012  Physical Exam  Nursing note and vitals reviewed. Constitutional: She is oriented to person, place, and time. She appears well-developed and well-nourished.       Pt was uncomfortable, writhing around on bed during physical exam.  HENT:  Head: Normocephalic and atraumatic.  Eyes: Pupils are equal, round, and reactive to light.  Neck: Normal range of motion. Neck supple.  Cardiovascular: Normal rate, regular rhythm and normal heart sounds.  Exam reveals no gallop and no friction rub.   No murmur heard. Pulmonary/Chest: Effort normal and breath sounds normal. No respiratory distress. She has no wheezes. She has no rales. She exhibits no tenderness.  Abdominal: Soft. Bowel sounds are normal. She exhibits no distension. There is tenderness. There is no rebound and no guarding.       Focally tender in right lower quadrant. Positive McBurney's. Negative Murphy. Negative rovsing.   Musculoskeletal: Normal range of motion. She exhibits no edema and no tenderness.  Neurological: She is alert and oriented to person, place, and time.  Skin: Skin is warm and dry. No rash noted. She is not diaphoretic.    ED Course  Procedures (including critical care time)   Labs Reviewed  LIPASE, BLOOD  COMPREHENSIVE METABOLIC PANEL  CBC WITH DIFFERENTIAL  URINALYSIS, MICROSCOPIC ONLY   Results for orders placed during the hospital encounter of 06/14/12  LIPASE, BLOOD      Component Value Range   Lipase 24  11 - 59 U/L  COMPREHENSIVE METABOLIC PANEL      Component Value Range   Sodium 139  135 - 145 mEq/L   Potassium 3.4 (*) 3.5 - 5.1 mEq/L   Chloride 105  96 - 112 mEq/L   CO2 21  19 - 32 mEq/L   Glucose, Bld  138 (*) 70 - 99 mg/dL   BUN 16  6 - 23 mg/dL   Creatinine, Ser 7.82  0.50 - 1.10 mg/dL   Calcium 9.2  8.4 - 95.6 mg/dL   Total Protein 7.3  6.0 - 8.3 g/dL   Albumin 4.2  3.5 - 5.2 g/dL   AST 14  0 - 37 U/L   ALT 9  0 - 35 U/L   Alkaline Phosphatase 55  39 - 117 U/L   Total Bilirubin 0.3  0.3 - 1.2 mg/dL   GFR calc non Af Amer >90  >90 mL/min   GFR calc Af Amer >90  >90 mL/min  CBC WITH DIFFERENTIAL      Component Value Range   WBC 14.3 (*) 4.0 - 10.5 K/uL   RBC 3.49 (*) 3.87 - 5.11 MIL/uL   Hemoglobin 11.3 (*) 12.0 - 15.0 g/dL   HCT 14.7 (*) 82.9 - 56.2 %   MCV 97.1  78.0 - 100.0 fL   MCH 32.4  26.0 - 34.0 pg   MCHC 33.3  30.0 - 36.0 g/dL   RDW 13.0  86.5 - 78.4 %   Platelets 244  150 - 400 K/uL   Neutrophils Relative 79 (*) 43 - 77 %   Neutro Abs 11.3 (*) 1.7 - 7.7 K/uL   Lymphocytes Relative 12  12 - 46 %   Lymphs Abs 1.8  0.7 - 4.0 K/uL   Monocytes Relative 8  3 - 12 %   Monocytes Absolute 1.2 (*) 0.1 - 1.0 K/uL   Eosinophils Relative 1  0 - 5 %   Eosinophils Absolute 0.1  0.0 - 0.7 K/uL   Basophils Relative 0  0 - 1 %   Basophils Absolute 0.0  0.0 - 0.1 K/uL  URINALYSIS, MICROSCOPIC ONLY      Component Value Range   Color, Urine AMBER (*) YELLOW   APPearance CLOUDY (*) CLEAR   Specific Gravity, Urine 1.034 (*) 1.005 - 1.030   pH 6.0  5.0 - 8.0   Glucose, UA NEGATIVE  NEGATIVE mg/dL   Hgb urine dipstick NEGATIVE  NEGATIVE   Bilirubin Urine NEGATIVE  NEGATIVE   Ketones, ur >80 (*) NEGATIVE mg/dL   Protein, ur 696 (*) NEGATIVE mg/dL   Urobilinogen, UA 1.0  0.0 - 1.0 mg/dL   Nitrite NEGATIVE  NEGATIVE   Leukocytes, UA NEGATIVE  NEGATIVE   Bacteria, UA MANY (*) RARE   Squamous Epithelial / LPF MANY (*) RARE   Urine-Other MUCOUS PRESENT    POCT I-STAT TROPONIN I      Component Value Range   Troponin i, poc 0.00  0.00 - 0.08 ng/mL   Comment 3           POCT I-STAT, CHEM 8      Component Value Range   Sodium 142  135 - 145 mEq/L   Potassium 3.2 (*) 3.5 - 5.1 mEq/L    Chloride 109  96 - 112 mEq/L   BUN 17  6 - 23 mg/dL   Creatinine, Ser 2.95  0.50 - 1.10 mg/dL   Glucose, Bld 284 (*) 70 - 99 mg/dL   Calcium, Ion 1.32  4.40 - 1.23 mmol/L   TCO2 22  0 - 100 mmol/L   Hemoglobin 12.9  12.0 - 15.0 g/dL   HCT 10.2  72.5 - 36.6 %  POCT PREGNANCY, URINE      Component Value Range   Preg Test, Ur NEGATIVE  NEGATIVE    EKG: Normal sinus rhythm. No acute cardiac changes.  Diagnosis to be determined.    MDM  Urinalysis came back negative for bHCG to rule out ectopic pregnancy. Urinalysis shows mild dehydration, treated with a bolus. CMP unremarkable. CBC shows leukocytosis. CT imaging for appendicitis pending. Care transferred at change of shift.  Glade Nurse, PA-C 06/14/12  [address]  Glade Nurse, PA-C 06/14/12 0722  Glade Nurse, PA-C 06/14/12 [id number]  Glade Nurse, PA-C 06/14/12 0730  Glade Nurse, PA-C 06/14/12 [phone]

## 2012-06-14 NOTE — Anesthesia Postprocedure Evaluation (Signed)
  Anesthesia Post-op Note  Patient: Jennifer Peters  Procedure(s) Performed: Procedure(s) (LRB) with comments: APPENDECTOMY LAPAROSCOPIC (N/A)  Patient Location: PACU  Anesthesia Type:General  Level of Consciousness: awake, alert , oriented and patient cooperative  Airway and Oxygen Therapy: Patient Spontanous Breathing  Post-op Pain: none  Post-op Assessment: Post-op Vital signs reviewed, Patient's Cardiovascular Status Stable, Respiratory Function Stable, Patent Airway, No signs of Nausea or vomiting and Pain level controlled  Post-op Vital Signs: Reviewed and stable  Complications: No apparent anesthesia complications

## 2012-06-14 NOTE — Transfer of Care (Signed)
Immediate Anesthesia Transfer of Care Note  Patient: Jennifer Peters  Procedure(s) Performed: Procedure(s) (LRB) with comments: APPENDECTOMY LAPAROSCOPIC (N/A)  Patient Location: PACU  Anesthesia Type:Regional  Level of Consciousness: awake, alert  and oriented  Airway & Oxygen Therapy: Patient Spontanous Breathing  Post-op Assessment: Report given to PACU RN  Post vital signs: stable  Complications: No apparent anesthesia complications

## 2012-06-14 NOTE — ED Provider Notes (Signed)
Medical screening examination/treatment/procedure(s) were performed by non-physician practitioner and as supervising physician I was immediately available for consultation/collaboration.   Glynn Octave, MD 06/14/12 [phone]

## 2012-06-14 NOTE — ED Notes (Signed)
CT made aware pt finished drinking PO contrast. 

## 2012-06-14 NOTE — ED Notes (Signed)
Hannah, PA at the bedside.  

## 2012-06-14 NOTE — ED Notes (Signed)
Patient presents ambulatory via [facility] with c/o mid umbilical abdominal pain that radiates to right side/back; onset 1 day ago.

## 2012-06-14 NOTE — ED Notes (Signed)
Pt still has infant and young child at the bedside. Unable to get a family member to pick up children until 8 am.

## 2012-06-14 NOTE — ED Provider Notes (Signed)
S: 21yo F, 46mo post partum, with abdominal pain since this AM, +N/V, - fever  PE: focal RLQ tenderness at McBurney's point, increased WBC at 14.3, ketones in urine  Plan: await CT abd/pelvis to r/o Appendicitis.  If neg, pt may be d/c home with symptomatic treatment   8:19 AM RN reports that patient's diffusing to drink her contrast, complaining of pain and nausea.  Reports from the night nurse her that 0.5 of Dilaudid made the patient will be unable to care for her children.  Patient is a 23-year-old and a 26-month-old in the room with her and is currently sleepy.  Discussed with her the need to drink her contrast and Ventolin to take her children. We will not give any more narcotics and told someone to take the children.  11:58 AM Ct is positive for acute appendicitis and is with Bilateral pelvocaliectasis.  Pain medication re-dosed and surgery consulted for admission.      Jennifer Client Lorenza Winkleman, PA-C 06/14/12 1159

## 2012-06-14 NOTE — ED Provider Notes (Addendum)
22 year old female with a history of recent vaginal delivery approximately 2 months ago who presents with acute onset of right lower quadrant pain. This has been persistent, gradually worsening and associated with multiple episodes of nausea and vomiting. She denies dysuria, vaginal discharge, vaginal bleeding, diarrhea. She has no history of prior abdominal surgery.  On exam the patient has a soft abdomen which is focally tender in the right lower quadrant. She has pain at McBurney's point, she has no guarding, no peritoneal signs, no Rovsing sign. Lungs are clear, heart is regular, no tachycardia.  We'll have to rule out ectopic pregnancy and appendicitis, lab show a leukocytosis.  Change of shift, care signed out to Dr. Manus Gunning   Medical screening examination/treatment/procedure(s) were conducted as a shared visit with non-physician practitioner(s) and myself.  I personally evaluated the patient during the encounter    Vida Roller, MD 06/14/12 0532  Vida Roller, MD 06/14/12 [phone]

## 2012-06-14 NOTE — ED Notes (Signed)
Pt vomiting small amount while getting medication. Encouraged to allow time for the nausea medicine to work then try sips of contrast.

## 2012-06-14 NOTE — ED Notes (Signed)
Pt states she is able to get the contrast down a little better now but it still tastes horrible. Encouraged her to drink.

## 2012-06-14 NOTE — Preoperative (Signed)
Beta Blockers   Reason not to administer Beta Blockers:Not Applicable 

## 2012-06-14 NOTE — ED Notes (Signed)
Pt drinking po contrast

## 2012-06-14 NOTE — ED Notes (Signed)
Pt vomited approx 50cc of "contrast"

## 2012-06-14 NOTE — H&P (Signed)
Pt seen chart and labs,, CT reviewed.  Acute appendicitis.  Recommend laparoscopic appendectomy.  Medical options discussed.  Risks of bleeding,  Infection,  Organ injury,  Open surgery  Abscess  Discussed.  Agrees to proceed.

## 2012-06-14 NOTE — Anesthesia Preprocedure Evaluation (Addendum)
Anesthesia Evaluation  Patient identified by MRN, date of birth, ID band Patient awake    Reviewed: Allergy & Precautions, H&P , NPO status , Patient's Chart, lab work & pertinent test results  History of Anesthesia Complications Negative for: history of anesthetic complications  Airway Mallampati: II TM Distance: >3 FB Neck ROM: Full    Dental  (+) Teeth Intact and Dental Advisory Given   Pulmonary former smoker (quit 2 months),  breath sounds clear to auscultation  Pulmonary exam normal       Cardiovascular negative cardio ROS  Rhythm:Regular Rate:Normal     Neuro/Psych negative neurological ROS     GI/Hepatic Neg liver ROS, GERD-  ,Nausea and vomitong with acute appy   Endo/Other  negative endocrine ROS  Renal/GU negative Renal ROS     Musculoskeletal negative musculoskeletal ROS (+)   Abdominal Normal abdominal exam  (+)   Peds negative pediatric ROS (+)  Hematology  (+) Blood dyscrasia, anemia ,   Anesthesia Other Findings   Reproductive/Obstetrics Preg test today: Neg                         Anesthesia Physical Anesthesia Plan  ASA: II  Anesthesia Plan: General   Post-op Pain Management:    Induction: Intravenous  Airway Management Planned: Oral ETT  Additional Equipment:   Intra-op Plan:   Post-operative Plan: Extubation in OR  Informed Consent: I have reviewed the patients History and Physical, chart, labs and discussed the procedure including the risks, benefits and alternatives for the proposed anesthesia with the patient or authorized representative who has indicated his/her understanding and acceptance.   Dental advisory given  Plan Discussed with: Anesthesiologist, Surgeon and CRNA  Anesthesia Plan Comments: (Plan routine monitors, GETA with RSI)       Anesthesia Quick Evaluation

## 2012-06-15 LAB — BASIC METABOLIC PANEL
Calcium: 8.6 mg/dL (ref 8.4–10.5)
GFR calc Af Amer: >90 mL/min (ref >90)
GFR calc non Af Amer: >90 mL/min (ref >90)
Glucose, Bld: 99 mg/dL (ref 70–99)
Potassium: 3.5 mEq/L (ref 3.5–5.1)
Sodium: 142 mEq/L (ref 135–145)

## 2012-06-15 LAB — CBC
Hemoglobin: 9.7 g/dL — ABNORMAL LOW (ref 12.0–15.0)
MCHC: 32.4 g/dL (ref 30.0–36.0)
Platelets: 203 10*3/uL (ref 150–400)
RBC: 3 MIL/uL — ABNORMAL LOW (ref 3.87–5.11)

## 2012-06-15 LAB — URINE CULTURE

## 2012-06-15 MED ORDER — HYDROCODONE-ACETAMINOPHEN 5-325 MG PO TABS
1.0000 | ORAL_TABLET | ORAL | Status: DC | PRN
Start: 2012-06-15 — End: 2013-06-05

## 2012-06-15 MED ORDER — PRENATAL MULTIVITAMIN CH: 1.0000 | ORAL_TABLET | Freq: Every day | ORAL | Status: DC

## 2012-06-15 NOTE — Op Note (Signed)
Jennifer Peters, Jennifer Peters                ACCOUNT NO.:  [id number]  MEDICAL RECORD NO.:  [id number]  LOCATION:  6N27C                        FACILITY:  [facility]  PHYSICIAN:  Maisie Fus A. Nina Mondor, M.D.DATE OF BIRTH:  June 25, 1990  DATE OF PROCEDURE:  06/14/2012 DATE OF DISCHARGE:                              OPERATIVE REPORT   PREOPERATIVE DIAGNOSIS:  Acute appendicitis.  POSTOPERATIVE DIAGNOSIS:  Acute appendicitis.  PROCEDURE:  Laparoscopic appendectomy.  SURGEON:  Maisie Fus A. Nicle Connole, MD  ANESTHESIA:  General endotracheal anesthesia with 0.25% Sensorcaine local with epinephrine.  EBL:  Minimal.  SPECIMEN:  Appendix to Pathology.  DRAINS:  None.  INDICATIONS FOR PROCEDURE:  The patient is a 22 year old female with 2- day history of right lower quadrant pain.  CT scan showed an inflamed appendix.  We discussed both operative and nonoperative management of this condition.  Risks, benefits, and alternative therapies all discussed.  After discussion of the above, both operative and nonoperative measures, she wished to proceed with laparoscopic appendectomy.  Risk of bleeding, infection, open procedure, abscess, organ injury, bowel injury, injury to the ureter, injury to the iliac vessels, injury to the pelvic organs, the need for other operations discussed.  She voiced understanding and agreed to proceed.  DESCRIPTION OF PROCEDURE:  The patient was brought to the operating room after being seen in the holding area and questions were answered.  Her chart was reviewed.  The patient was examined and CT scan was reviewed. After induction of general anesthesia, the abdomen was prepped and draped in sterile fashion.  A Foley catheter was placed under sterile conditions.  A time-out was done and she received 2 g of cefoxitin. After time-out was done, a 1 cm supraumbilical incision was made. Dissection was carried down to the fascia in the midline.  This was opened for approximately 1 cm.  I was  able to enter the abdominal cavity under direct vision without injury.  Pursestring suture of 0 Vicryl was placed in the fascia and a 12 mm Hasson cannula was placed under direct vision.  Pneumoperitoneum was created to 15 mmHg of CO2.  Laparoscope was placed.  She was rolled to her left and placed in mild Trendelenburg position.  Two 5 mm ports were placed, 1 in the left lower quadrant, the second in lower midline under direct vision.  The appendix was identified and was retrocecal.  It had to be dissected away from the posterior wall of the cecum.  This was done bluntly and with the Harmonic scalpel with care taken not to injure the cecum.  The appendix was acutely inflamed and distended.  The mesoappendix was taken down with the Harmonic scalpel.  This was done all the way down to the entry into the base of the appendix at the junction of the cecum.  Using a 5 mm scope, a GIA 45 stapling device was placed across the base of the appendix and cecum and fired.  Staples were deployed without difficulty and the appendiceal stump was well sealed.  There was no significant stump to speak of.  The appendix was placed in EndoCatch bag.  The stump was irrigated and found to be hemostatic.  The mesoappendix was  hemostatic.  The cecum showed no traumatic injury.  The appendix was removed and passed off the field.  Irrigation was used to suction out. The uterus was mildly enlarged with the patient being postpartum by 2 months.  The ascending, transverse, and descending colons were normal. Small bowel normal.  Gallbladder normal.  Liver normal.  Stomach normal. I could not see left right ovaries well.  There is no obvious intrapelvic abnormality noted.  At this point, I removed ports allowing the CO2 to escape.  The pursestring suture was closed to close the fascia at the umbilicus.  A 4-0 Monocryl was used to close the skin in subcuticular fashion.  Dermabond was applied.  All final counts,  of sponge, needle, and instruments were found to be correct at this portion of case.  The patient was then awoke, extubated, and taken to recovery in satisfactory condition.     Jennifer Peters, M.D.     TAC/MEDQ  D:  06/14/2012  T:  06/15/2012  Job:  [id number]

## 2012-06-15 NOTE — Discharge Summary (Signed)
  Patient ID: Jennifer Peters [id number] 21 y.o. 1991/02/05  06/14/2012  Discharge date and time: June 15, 2012  Admitting Physician: Ernestene Mention  Discharge Physician: Ernestene Mention  Admission Diagnoses: Acute appendicitis [540.9] Acute appendicitis appendicitis  Discharge Diagnoses: Acute appendicitis  Operations: Procedure(s): APPENDECTOMY LAPAROSCOPIC  Admission Condition: fair  Discharged Condition: good  Indication for Admission: The patient is a 22 year old female with 2-  day history of right lower quadrant pain. CT scan showed an inflamed  appendix. We discussed both operative and nonoperative management of  this condition. She was admitted through the emergency department and taken topically to the operating room for appendectomy.  Hospital Course: The patient was evaluated in the emergency department and admitted to the hospital and taken to the operating room. She underwent a laparoscopic appendectomy with findings of acute appendicitis but no evidence of rupture or peritonitis. She remained stable overnight. The following morning she was doing well. She was ambulatory, avoiding, and tolerating diet and she had no abdominal wound problems and was ready to go home. Diet and activities were discussed. Return to work was discussed. Prescription for Vicodin given. Followup with Dr. Luisa Hart in 2 weeks.  Consults: none  Significant Diagnostic Studies: radiology: CT scan: abdomen and pelvis  Treatments: surgery: Laparoscopic appendectomy  Disposition: Home  Patient Instructions:   Jennifer, Peters  Home Medication Instructions [id number]   Printed on:06/15/12 9562  Medication Information                    Prenatal Vit-Fe Fumarate-FA (PRENATAL MULTIVITAMIN) TABS Take 1 tablet by mouth daily.           HYDROcodone-acetaminophen (NORCO/VICODIN) 5-325 MG per tablet Take 1-2 tablets by mouth every 4 (four) hours as needed for pain.             Activity: no  sports or heavy lifting for 2 weeks. Diet: low fat, low cholesterol diet Wound Care: none needed  Follow-up:  With Dr. Luisa Hart in 2 weeks.  Signed: Angelia Mould. Derrell Lolling, M.D., FACS General and minimally invasive surgery Breast and Colorectal Surgery  06/15/2012, 8:32 AM

## 2012-06-15 NOTE — Progress Notes (Signed)
1 Day Post-Op  Subjective: Stable and alert. Incision sore but appropriate. Voiding. Tolerating diet. Feels ready to go home.  Objective: Vital signs in last 24 hours: Temp:  [97.1 F (36.2 C)-99.5 F (37.5 C)] 97.1 F (36.2 C) (01/12 0631) Pulse Rate:  [56-108] 72  (01/12 0631) Resp:  [12-20] 16  (01/12 0631) BP: (99-135)/(65-88) 116/74 mmHg (01/12 0631) SpO2:  [97 %-100 %] 100 % (01/12 0237) Weight:  [123 lb (55.792 kg)-123 lb 7.3 oz (56 kg)] 123 lb 7.3 oz (56 kg) (01/11 1254) Last BM Date: 06/14/12  Intake/Output from previous day: 01/11 0701 - 01/12 0700 In: 3158.8 [P.O.:240; I.V.:2918.8] Out: 100 [Urine:100] Intake/Output this shift:    General appearance: alert. Mental status normal. In no distress. Cooperative. GI: abdomen soft. Nondistended. Wounds look good. Minimal incisional tenderness.  Lab Results:  Results for orders placed during the hospital encounter of 06/14/12 (from the past 24 hour(s))  SURGICAL PCR SCREEN     Status: Normal   Collection Time   06/14/12  1:00 PM      Component Value Range   MRSA, PCR NEGATIVE  NEGATIVE   Staphylococcus aureus NEGATIVE  NEGATIVE  CBC     Status: Abnormal   Collection Time   06/14/12  5:21 PM      Component Value Range   WBC 14.8 (*) 4.0 - 10.5 K/uL   RBC 3.32 (*) 3.87 - 5.11 MIL/uL   Hemoglobin 10.7 (*) 12.0 - 15.0 g/dL   HCT 40.9 (*) 81.1 - 91.4 %   MCV 97.3  78.0 - 100.0 fL   MCH 32.2  26.0 - 34.0 pg   MCHC 33.1  30.0 - 36.0 g/dL   RDW 78.2  95.6 - 21.3 %   Platelets 229  150 - 400 K/uL  CREATININE, SERUM     Status: Normal   Collection Time   06/14/12  5:21 PM      Component Value Range   Creatinine, Ser 0.66  0.50 - 1.10 mg/dL   GFR calc non Af Amer >90  >90 mL/min   GFR calc Af Amer >90  >90 mL/min  CBC     Status: Abnormal   Collection Time   06/15/12  6:45 AM      Component Value Range   WBC 8.4  4.0 - 10.5 K/uL   RBC 3.00 (*) 3.87 - 5.11 MIL/uL   Hemoglobin 9.7 (*) 12.0 - 15.0 g/dL   HCT 08.6 (*)  57.8 - 46.0 %   MCV 99.7  78.0 - 100.0 fL   MCH 32.3  26.0 - 34.0 pg   MCHC 32.4  30.0 - 36.0 g/dL   RDW 46.9  62.9 - 52.8 %   Platelets 203  150 - 400 K/uL  BASIC METABOLIC PANEL     Status: Normal   Collection Time   06/15/12  6:45 AM      Component Value Range   Sodium 142  135 - 145 mEq/L   Potassium 3.5  3.5 - 5.1 mEq/L   Chloride 109  96 - 112 mEq/L   CO2 24  19 - 32 mEq/L   Glucose, Bld 99  70 - 99 mg/dL   BUN 6  6 - 23 mg/dL   Creatinine, Ser 4.13  0.50 - 1.10 mg/dL   Calcium 8.6  8.4 - 24.4 mg/dL   GFR calc non Af Amer >90  >90 mL/min   GFR calc Af Amer >90  >90 mL/min  Studies/Results: @RISRSLT24 @     . cefOXitin  2 g Intravenous Q8H  . enoxaparin (LOVENOX) injection  40 mg Subcutaneous Q24H     Assessment/Plan: s/p Procedure(s): APPENDECTOMY LAPAROSCOPIC  Doing well postop. Discharge home today Prescription for Vicodin given Diet and activities discussed Return to see Dr. Luisa Hart in 2 weeks.    LOS: 1 day    Esmond Hinch M 06/15/2012  . .prob

## 2012-06-15 NOTE — Progress Notes (Signed)
Utilization review completed.  

## 2012-06-15 NOTE — ED Provider Notes (Signed)
Medical screening examination/treatment/procedure(s) were conducted as a shared visit with non-physician practitioner(s) and myself.  I personally evaluated the patient during the encounter  Please see my separate respective documentation pertaining to this patient encounter   Vida Roller, MD 06/15/12 [phone]

## 2012-06-16 ENCOUNTER — Encounter (HOSPITAL_COMMUNITY): Payer: Self-pay | Admitting: Surgery

## 2012-07-15 ENCOUNTER — Telehealth (INDEPENDENT_AMBULATORY_CARE_PROVIDER_SITE_OTHER): Payer: Self-pay

## 2012-07-15 NOTE — Telephone Encounter (Signed)
Pt calling c/o tenderness at surgical site.  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  I explained that since she was only 4 weeks out from her surgery she would still have some soreness and fatigue.  If she has increased pain to call our office.

## 2012-07-28 ENCOUNTER — Encounter (INDEPENDENT_AMBULATORY_CARE_PROVIDER_SITE_OTHER): Payer: Medicaid Other | Admitting: Surgery

## 2012-08-08 ENCOUNTER — Encounter (INDEPENDENT_AMBULATORY_CARE_PROVIDER_SITE_OTHER): Payer: Medicaid Other | Admitting: Surgery

## 2012-08-15 ENCOUNTER — Encounter (INDEPENDENT_AMBULATORY_CARE_PROVIDER_SITE_OTHER): Payer: Medicaid Other | Admitting: Surgery

## 2012-08-20 ENCOUNTER — Encounter (INDEPENDENT_AMBULATORY_CARE_PROVIDER_SITE_OTHER): Payer: Self-pay | Admitting: Surgery

## 2012-09-04 ENCOUNTER — Ambulatory Visit: Payer: Self-pay | Admitting: Obstetrics

## 2013-04-04 ENCOUNTER — Emergency Department (INDEPENDENT_AMBULATORY_CARE_PROVIDER_SITE_OTHER)
Admission: EM | Admit: 2013-04-04 | Discharge: 2013-04-04 | Disposition: A | Discharge status: Home / Self Care | Payer: Medicaid Other | Source: Home / Self Care

## 2013-04-04 ENCOUNTER — Encounter (HOSPITAL_COMMUNITY): Payer: Self-pay | Admitting: Emergency Medicine

## 2013-04-04 DIAGNOSIS — M65839 Other synovitis and tenosynovitis, unspecified forearm: Secondary | ICD-10-CM

## 2013-04-04 DIAGNOSIS — M778 Other enthesopathies, not elsewhere classified: Secondary | ICD-10-CM

## 2013-04-04 NOTE — ED Notes (Signed)
Pt c/o right thumb numbness onset today around 1700 Reports she was peeling shrimp when she felt stinging/burning sensation  Thumb immediately swelled... sxs also include: redness and numbness and small cut to thumb.  Denies: f/v/n/d... States she has NKDA Alert w/no signs of acute distress.

## 2013-04-04 NOTE — ED Provider Notes (Signed)
Medical screening examination/treatment/procedure(s) were performed by non-physician practitioner and as supervising physician I was immediately available for consultation/collaboration.  Derry Arbogast, M.D.  Renisha Cockrum C Semaja Lymon, MD 04/04/13 1823 

## 2013-04-04 NOTE — ED Provider Notes (Signed)
CSN: [id number]     Arrival date & time 04/04/13  1722 History   None    Chief Complaint  Patient presents with  . Hand Pain   (Consider location/radiation/quality/duration/timing/severity/associated sxs/prior Treatment) HPI Comments: While peeling shrimp this PM notice R thumb was beginning to swell. No pain. No trauma or known injury. Denies pain.    Past Medical History  Diagnosis Date  . No pertinent past medical history   . Anemia   . Post partum depression     Advanced FOB age 8  . Abnormal Pap smear of cervix     unknown stage of abnormality  . Sickle cell trait     ? FH of Mio trait  . Hx of chlamydia infection    Past Surgical History  Procedure Laterality Date  . Dilation and curettage of uterus    . Wisdom tooth extracted x1 in 04/2011    . Laparoscopic appendectomy  06/14/2012    Procedure: APPENDECTOMY LAPAROSCOPIC;  Surgeon: Clovis Pu. Cornett, MD;  Location: [facility] OR;  Service: General;  Laterality: N/A;   Family History  Problem Relation Age of Onset  . Other Neg Hx   . Diabetes Maternal Aunt   . Kidney disease Maternal Grandmother   . Heart disease Maternal Grandmother     chf  . Hypertension Mother    History  Substance Use Topics  . Smoking status: Former Smoker -- 0.50 packs/day for 3 years    Quit date: 08/21/2011  . Smokeless tobacco: Never Used  . Alcohol Use: No   OB History   Grav Para Term Preterm Abortions TAB SAB Ect Mult Living   4 2 2  1  1   2      Review of Systems  Musculoskeletal:       As above  All other systems reviewed and are negative.    Allergies  Review of patient's allergies indicates no known allergies.  Home Medications   Current Outpatient Rx  Name  Route  Sig  Dispense  Refill  . HYDROcodone-acetaminophen (NORCO/VICODIN) 5-325 MG per tablet   Oral   Take 1-2 tablets by mouth every 4 (four) hours as needed for pain.   30 tablet   1   . Prenatal Vit-Fe Fumarate-FA (PRENATAL MULTIVITAMIN) TABS   Oral   Take  1 tablet by mouth daily.   90 tablet   3    BP 109/67  Pulse 76  Temp(Src) 98.6 F (37 C) (Oral)  Resp 14  SpO2 98%  LMP 03/19/2013  Breastfeeding? No Physical Exam  Nursing note and vitals reviewed. Constitutional: She is oriented to person, place, and time. She appears well-developed and well-nourished. No distress.  HENT:  Head: Normocephalic and atraumatic.  Eyes: EOM are normal. Pupils are equal, round, and reactive to light.  Neck: Normal range of motion. Neck supple.  Musculoskeletal:  Very minor swelling to the IP jt of the R thumb. No tenderness or deformity or discoloration. Full ROM and opposition.   Lymphadenopathy:    She has no cervical adenopathy.  Neurological: She is alert and oriented to person, place, and time. No cranial nerve deficit.  Skin: Skin is warm and dry.  Psychiatric: She has a normal mood and affect.    ED Course  Procedures (including critical care time) Labs Review Labs Reviewed - No data to display Imaging Review No results found.    MDM   1. Tendinitis of thumb       Thumb splint  in POF for 3 days. Ice for 2 days on and off.  F/U with PCP as needed    Hayden Rasmussen, NP 04/04/13 1818

## 2013-06-05 ENCOUNTER — Emergency Department (HOSPITAL_COMMUNITY)
Admission: EM | Admit: 2013-06-05 | Discharge: 2013-06-05 | Disposition: A | Discharge status: Home / Self Care | Payer: Medicaid Other | Attending: Emergency Medicine | Admitting: Emergency Medicine

## 2013-06-05 ENCOUNTER — Encounter (HOSPITAL_COMMUNITY): Payer: Self-pay | Admitting: Emergency Medicine

## 2013-06-05 DIAGNOSIS — Z3202 Encounter for pregnancy test, result negative: Secondary | ICD-10-CM | POA: Insufficient documentation

## 2013-06-05 DIAGNOSIS — Z862 Personal history of diseases of the blood and blood-forming organs and certain disorders involving the immune mechanism: Secondary | ICD-10-CM | POA: Insufficient documentation

## 2013-06-05 DIAGNOSIS — R6889 Other general symptoms and signs: Secondary | ICD-10-CM

## 2013-06-05 DIAGNOSIS — Z8619 Personal history of other infectious and parasitic diseases: Secondary | ICD-10-CM | POA: Insufficient documentation

## 2013-06-05 DIAGNOSIS — R109 Unspecified abdominal pain: Secondary | ICD-10-CM | POA: Insufficient documentation

## 2013-06-05 DIAGNOSIS — Z87891 Personal history of nicotine dependence: Secondary | ICD-10-CM | POA: Insufficient documentation

## 2013-06-05 DIAGNOSIS — D573 Sickle-cell trait: Secondary | ICD-10-CM | POA: Insufficient documentation

## 2013-06-05 DIAGNOSIS — Z8659 Personal history of other mental and behavioral disorders: Secondary | ICD-10-CM | POA: Insufficient documentation

## 2013-06-05 DIAGNOSIS — J111 Influenza due to unidentified influenza virus with other respiratory manifestations: Secondary | ICD-10-CM | POA: Insufficient documentation

## 2013-06-05 LAB — COMPREHENSIVE METABOLIC PANEL
ALK PHOS: 55 U/L (ref 39–117)
ALT: 11 U/L (ref 0–35)
AST: 15 U/L (ref 0–37)
Albumin: 4.1 g/dL (ref 3.5–5.2)
BILIRUBIN TOTAL: 0.4 mg/dL (ref 0.3–1.2)
BUN: 14 mg/dL (ref 6–23)
CO2: 27 meq/L (ref 19–32)
Calcium: 8.5 mg/dL (ref 8.4–10.5)
Chloride: 106 mEq/L (ref 96–112)
Creatinine, Ser: 0.7 mg/dL (ref 0.50–1.10)
GLUCOSE: 85 mg/dL (ref 70–99)
POTASSIUM: 3.6 meq/L — AB (ref 3.7–5.3)
SODIUM: 144 meq/L (ref 137–147)
Total Protein: 7.3 g/dL (ref 6.0–8.3)

## 2013-06-05 LAB — CBC WITH DIFFERENTIAL/PLATELET
Basophils Absolute: 0 10*3/uL (ref 0.0–0.1)
Basophils Relative: 0 % (ref 0–1)
Eosinophils Absolute: 0.2 10*3/uL (ref 0.0–0.7)
Eosinophils Relative: 2 % (ref 0–5)
HCT: 31.2 % — ABNORMAL LOW (ref 36.0–46.0)
HEMOGLOBIN: 10.6 g/dL — AB (ref 12.0–15.0)
LYMPHS ABS: 2.1 10*3/uL (ref 0.7–4.0)
LYMPHS PCT: 27 % (ref 12–46)
MCH: 34.8 pg — ABNORMAL HIGH (ref 26.0–34.0)
MCHC: 34 g/dL (ref 30.0–36.0)
MCV: 102.3 fL — AB (ref 78.0–100.0)
Monocytes Absolute: 0.7 10*3/uL (ref 0.1–1.0)
Monocytes Relative: 9 % (ref 3–12)
NEUTROS PCT: 61 % (ref 43–77)
Neutro Abs: 4.6 10*3/uL (ref 1.7–7.7)
PLATELETS: 273 10*3/uL (ref 150–400)
RBC: 3.05 MIL/uL — AB (ref 3.87–5.11)
RDW: 11.5 % (ref 11.5–15.5)
WBC: 7.6 10*3/uL (ref 4.0–10.5)

## 2013-06-05 LAB — URINALYSIS, ROUTINE W REFLEX MICROSCOPIC
BILIRUBIN URINE: NEGATIVE
Glucose, UA: NEGATIVE mg/dL
Hgb urine dipstick: NEGATIVE
KETONES UR: 15 mg/dL — AB
Leukocytes, UA: NEGATIVE
NITRITE: NEGATIVE
PH: 6.5 (ref 5.0–8.0)
Protein, ur: NEGATIVE mg/dL
Specific Gravity, Urine: 1.028 (ref 1.005–1.030)
UROBILINOGEN UA: 2 mg/dL — AB (ref 0.0–1.0)

## 2013-06-05 LAB — PREGNANCY, URINE: Preg Test, Ur: NEGATIVE

## 2013-06-05 LAB — RAPID STREP SCREEN (MED CTR MEBANE ONLY): Streptococcus, Group A Screen (Direct): NEGATIVE

## 2013-06-05 MED ORDER — BENZONATATE 100 MG PO CAPS: 100.0000 mg | ORAL_CAPSULE | Freq: Three times a day (TID) | ORAL | Status: DC

## 2013-06-05 MED ORDER — PSEUDOEPHEDRINE HCL 30 MG PO TABS: 30.0000 mg | ORAL_TABLET | ORAL | Status: DC | PRN

## 2013-06-05 NOTE — Discharge Instructions (Signed)
Take tylenol or motrin for fever and body aches. Take sudafed for congestion. Take tessalon perles for cough as prescribed as needed. Rest. No work for the next 2 days. Drink plenty of fluids. Follow up with your doctor.   Influenza A (H1N1) H1N1 formerly called "swine flu" is a new influenza virus causing sickness in people. The H1N1 virus is different from seasonal influenza viruses. However, the H1N1 symptoms are similar to seasonal influenza and it is spread from person to person. You may be at higher risk for serious problems if you have underlying serious medical conditions. The CDC and the [employer] are following reported cases around the world. CAUSES   The flu is thought to spread mainly person-to-person through coughing or sneezing of infected people.  A person may become infected by touching something with the virus on it and then touching their mouth or nose. SYMPTOMS   Fever.  Headache.  Tiredness.  Cough.  Sore throat.  Runny or stuffy nose.  Body aches.  Diarrhea and vomiting These symptoms are referred to as "flu-like symptoms." A lot of different illnesses, including the common cold, may have similar symptoms. DIAGNOSIS   There are tests that can tell if you have the H1N1 virus.  Confirmed cases of H1N1 will be reported to the state or local health department.  A doctor's exam may be needed to tell whether you have an infection that is a complication of the flu. HOME CARE INSTRUCTIONS   Stay informed. Visit the [facility] CCDC website for current recommendations. Visit [employer].tnwww.cdc.gov/H1N1flu/. You may also call 1-800-CDC-INFO (513-109-66811-(708) 775-6597).  Get help early if you develop any of the above symptoms.  If you are at high risk from complications of the flu, talk to your caregiver as soon as you develop flu-like symptoms. Those at higher risk for complications include:  People 65 years or older.  People with chronic medical conditions.  Pregnant  women.  Young children.  Your caregiver may recommend antiviral medicine to help treat the flu.  If you get the flu, get plenty of rest, drink enough water and fluids to keep your urine clear or pale yellow, and avoid using alcohol or tobacco.  You may take over-the-counter medicine to relieve the symptoms of the flu if your caregiver approves. (Never give aspirin to children or teenagers who have flu-like symptoms, particularly fever). TREATMENT  If you do get sick, antiviral drugs are available. These drugs can make your illness milder and make you feel better faster. Treatment should start soon after illness starts. It is only effective if taken within the first day of becoming ill. Only your caregiver can prescribe antiviral medication.  PREVENTION   Cover your nose and mouth with a tissue or your arm when you cough or sneeze. Throw the tissue away.  Wash your hands often with soap and warm water, especially after you cough or sneeze. Alcohol-based cleaners are also effective against germs.  Avoid touching your eyes, nose or mouth. This is one way germs spread.  Try to avoid contact with sick people. Follow public health advice regarding school closures. Avoid crowds.  Stay home if you get sick. Limit contact with others to keep from infecting them. People infected with the H1N1 virus may be able to infect others anywhere from 1 day before feeling sick to 5-7 days after getting flu symptoms.  An H1N1 vaccine is available to help protect against the virus. In addition to the H1N1 vaccine, you will need to be vaccinated for  seasonal influenza. The H1N1 and seasonal vaccines may be given on the same day. The CDC especially recommends the H1N1 vaccine for:  Pregnant women.  People who live with or care for children younger than 42 months of age.  Health care and emergency services personnel.  Persons between the ages of 43 months through 81 years of age.  People from ages 86 through 7  years who are at higher risk for H1N1 because of chronic health disorders or immune system problems. FACEMASKS In community and home settings, the use of facemasks and N95 respirators are not normally recommended. In certain circumstances, a facemask or N95 respirator may be used for persons at increased risk of severe illness from influenza. Your caregiver can give additional recommendations for facemask use. IN CHILDREN, EMERGENCY WARNING SIGNS THAT NEED URGENT MEDICAL CARE:  Fast breathing or trouble breathing.  Bluish skin color.  Not drinking enough fluids.  Not waking up or not interacting normally.  Being so fussy that the child does not want to be held.  Your child has an oral temperature above 102 F (38.9 C), not controlled by medicine.  Your baby is older than 3 months with a rectal temperature of 102 F (38.9 C) or higher.  Your baby is 76 months old or younger with a rectal temperature of 100.4 F (38 C) or higher.  Flu-like symptoms improve but then return with fever and worse cough. IN ADULTS, EMERGENCY WARNING SIGNS THAT NEED URGENT MEDICAL CARE:  Difficulty breathing or shortness of breath.  Pain or pressure in the chest or abdomen.  Sudden dizziness.  Confusion.  Severe or persistent vomiting.  Bluish color.  You have a oral temperature above 102 F (38.9 C), not controlled by medicine.  Flu-like symptoms improve but return with fever and worse cough. SEEK IMMEDIATE MEDICAL CARE IF:  You or someone you know is experiencing any of the above symptoms. When you arrive at the emergency center, report that you think you have the flu. You may be asked to wear a mask and/or sit in a secluded area to protect others from getting sick. MAKE SURE YOU:   Understand these instructions.  Will watch your condition.  Will get help right away if you are not doing well or get worse. Some of this information courtesy of the CDC.  Document Released: 11/07/2007  Document Revised: 08/13/2011 Document Reviewed: 11/07/2007 [facility] Patient Information 2014 [place], [place].

## 2013-06-05 NOTE — ED Provider Notes (Signed)
Medical screening examination/treatment/procedure(s) were performed by non-physician practitioner and as supervising physician I was immediately available for consultation/collaboration.  EKG Interpretation   None         Gwyneth SproutWhitney Meela Wareing, MD 06/05/13 2149

## 2013-06-05 NOTE — ED Notes (Signed)
Pt staes that she is having in the lower right quadrant that radiates to her lower back. Pt has had this pain since last Thursday. Pt states she is having hot flashes and chills. Pt complains of congestion and a sore throat.

## 2013-06-05 NOTE — ED Notes (Signed)
The pain is worse with movement and she feels tired

## 2013-06-05 NOTE — ED Notes (Signed)
The pt has lower abd pain  Since last Thursday no nv or diarrhea.  She is also c/o a headache nasal congestion and a sorethroat.  Chills and  Feeling hot.  She does not know if she has a temp.  lmp dec 25th.  She had a period x 2 in the month of december

## 2013-06-05 NOTE — ED Provider Notes (Signed)
CSN: [id number]     Arrival date & time 06/05/13  1633 History   First MD Initiated Contact with Patient 06/05/13 1805     Chief Complaint  Patient presents with  . multiple complaints    (Consider location/radiation/quality/duration/timing/severity/associated sxs/prior Treatment) HPI Jennifer Peters is a 23 y.o. female who presents emergency department complaining of flulike symptoms. She states that she has had nasal congestion, sore throat, cough, and "soreness over her abdomen going into the back." Patient states that she feels achy all over and having chills. She did not check her temperature at home. She states that her daughter has flu. She has been taking ibuprofen and Tylenol with no relief in her symptoms. She denies any urinary or vaginal symptoms. She denies any shortness of breath or pain in her chest. She is otherwise healthy and does not take any medications. She denies any nausea, vomiting, diarrhea. She denies being pregnant.  Past Medical History  Diagnosis Date  . No pertinent past medical history   . Anemia   . Post partum depression     Advanced FOB age 61  . Abnormal Pap smear of cervix     unknown stage of abnormality  . Sickle cell trait     ? FH of [place] trait  . Hx of chlamydia infection    Past Surgical History  Procedure Laterality Date  . Dilation and curettage of uterus    . Wisdom tooth extracted x1 in 04/2011    . Laparoscopic appendectomy  06/14/2012    Procedure: APPENDECTOMY LAPAROSCOPIC;  Surgeon: Clovis Pu. Cornett, MD;  Location: [facility] OR;  Service: General;  Laterality: N/A;   Family History  Problem Relation Age of Onset  . Other Neg Hx   . Diabetes Maternal Aunt   . Kidney disease Maternal Grandmother   . Heart disease Maternal Grandmother     chf  . Hypertension Mother    History  Substance Use Topics  . Smoking status: Former Smoker -- 0.50 packs/day for 3 years    Quit date: 08/21/2011  . Smokeless tobacco: Never Used  . Alcohol Use: No    OB History   Grav Para Term Preterm Abortions TAB SAB Ect Mult Living   4 2 2  1  1   2      Review of Systems  Constitutional: Negative for fever and chills.  HENT: Positive for congestion, sinus pressure and sore throat. Negative for ear pain.   Respiratory: Positive for cough. Negative for chest tightness and shortness of breath.   Cardiovascular: Negative for chest pain, palpitations and leg swelling.  Gastrointestinal: Positive for abdominal pain. Negative for nausea, vomiting and diarrhea.  Genitourinary: Negative for dysuria, flank pain, vaginal bleeding, vaginal discharge, vaginal pain and pelvic pain.  Musculoskeletal: Positive for arthralgias and myalgias. Negative for neck pain and neck stiffness.  Skin: Negative for rash.  Neurological: Negative for dizziness, weakness and headaches.  All other systems reviewed and are negative.    Allergies  Review of patient's allergies indicates no known allergies.  Home Medications   Current Outpatient Rx  Name  Route  Sig  Dispense  Refill  . HYDROcodone-acetaminophen (NORCO/VICODIN) 5-325 MG per tablet   Oral   Take 1-2 tablets by mouth every 4 (four) hours as needed for pain.   30 tablet   1   . Prenatal Vit-Fe Fumarate-FA (PRENATAL MULTIVITAMIN) TABS   Oral   Take 1 tablet by mouth daily.   90 tablet   3  BP 119/72  Pulse 78  Temp(Src) 97.8 F (36.6 C) (Oral)  Resp 16  SpO2 97%  LMP 05/28/2013 Physical Exam  Nursing note and vitals reviewed. Constitutional: She appears well-developed and well-nourished. No distress.  HENT:  Head: Normocephalic and atraumatic.  Right Ear: Tympanic membrane, external ear and ear canal normal.  Left Ear: Tympanic membrane, external ear and ear canal normal.  Nose: Mucosal edema and rhinorrhea present.  Mouth/Throat: Uvula is midline and mucous membranes are normal. Posterior oropharyngeal erythema present. No oropharyngeal exudate, posterior oropharyngeal edema or tonsillar  abscesses.  Eyes: Conjunctivae are normal. Pupils are equal, round, and reactive to light.  Neck: Normal range of motion. Neck supple.  Cardiovascular: Normal rate, regular rhythm and normal heart sounds.   Pulmonary/Chest: Effort normal and breath sounds normal. No respiratory distress. She has no wheezes. She has no rales.  Abdominal: Soft. Bowel sounds are normal. She exhibits no distension. There is no tenderness. There is no rebound and no guarding.  No cva tenderness bilaterally  Musculoskeletal: She exhibits no edema.  Lymphadenopathy:    She has no cervical adenopathy.  Neurological: She is alert.  Skin: Skin is warm and dry.  Psychiatric: She has a normal mood and affect. Her behavior is normal.    ED Course  Procedures (including critical care time) Labs Review Labs Reviewed  CBC WITH DIFFERENTIAL - Abnormal; Notable for the following:    RBC 3.05 (*)    Hemoglobin 10.6 (*)    HCT 31.2 (*)    MCV 102.3 (*)    MCH 34.8 (*)    All other components within normal limits  COMPREHENSIVE METABOLIC PANEL - Abnormal; Notable for the following:    Potassium 3.6 (*)    All other components within normal limits  URINALYSIS, ROUTINE W REFLEX MICROSCOPIC - Abnormal; Notable for the following:    Ketones, ur 15 (*)    Urobilinogen, UA 2.0 (*)    All other components within normal limits  RAPID STREP SCREEN  CULTURE, GROUP A STREP  PREGNANCY, URINE   Imaging Review No results found.  EKG Interpretation   None       MDM   1. Flu-like symptoms     Patient with flulike symptoms for several days. She has also been complaining of abdominal "soreness" however her abdomen is soft and completely nontender in examination. Her labs and UA are unremarkable. There is no indication for chest x-ray given she does not have any shortness of breath, pain in the chest, and has normal oxygen saturation. I do not think she has pneumonia. Her temperature here is normal as well. I suspect  patient's symptoms are due to a viral upper respiratory type infection, most likely influenza given that her daughter is sick with it. She is out of the window for Tamiflu. Will treat symptomatically with cough medication, decongestants, Tylenol and Motrin around-the-clock. She is instructed to rest, drink plenty of fluids, followup with her doctor next week for recheck.  Filed Vitals:   06/05/13 1645 06/05/13 1824  BP: 119/72 113/84  Pulse: 78 75  Temp: 97.8 F (36.6 C) 98.4 F (36.9 C)  TempSrc: Oral Oral  Resp: 16 20  SpO2: 97% 100%     Lottie Musselatyana A Marquise Lambson, PA-C 06/05/13 1857

## 2013-06-05 NOTE — ED Notes (Signed)
Pt's throat is red. It appears that there is post-nasal drip.

## 2013-06-07 LAB — CULTURE, GROUP A STREP

## 2013-07-06 ENCOUNTER — Encounter (HOSPITAL_COMMUNITY): Payer: Self-pay | Admitting: Emergency Medicine

## 2013-07-06 ENCOUNTER — Emergency Department (HOSPITAL_COMMUNITY)
Admission: EM | Admit: 2013-07-06 | Discharge: 2013-07-06 | Disposition: A | Discharge status: Home / Self Care | Payer: Medicaid Other | Attending: Emergency Medicine | Admitting: Emergency Medicine

## 2013-07-06 DIAGNOSIS — Z862 Personal history of diseases of the blood and blood-forming organs and certain disorders involving the immune mechanism: Secondary | ICD-10-CM | POA: Insufficient documentation

## 2013-07-06 DIAGNOSIS — K5289 Other specified noninfective gastroenteritis and colitis: Secondary | ICD-10-CM | POA: Insufficient documentation

## 2013-07-06 DIAGNOSIS — IMO0001 Reserved for inherently not codable concepts without codable children: Secondary | ICD-10-CM | POA: Insufficient documentation

## 2013-07-06 DIAGNOSIS — Z3202 Encounter for pregnancy test, result negative: Secondary | ICD-10-CM | POA: Insufficient documentation

## 2013-07-06 DIAGNOSIS — K529 Noninfective gastroenteritis and colitis, unspecified: Secondary | ICD-10-CM

## 2013-07-06 DIAGNOSIS — Z87891 Personal history of nicotine dependence: Secondary | ICD-10-CM | POA: Insufficient documentation

## 2013-07-06 DIAGNOSIS — Z8619 Personal history of other infectious and parasitic diseases: Secondary | ICD-10-CM | POA: Insufficient documentation

## 2013-07-06 LAB — URINALYSIS, ROUTINE W REFLEX MICROSCOPIC
BILIRUBIN URINE: NEGATIVE
Glucose, UA: NEGATIVE mg/dL
HGB URINE DIPSTICK: NEGATIVE
KETONES UR: NEGATIVE mg/dL
Nitrite: NEGATIVE
PROTEIN: NEGATIVE mg/dL
Specific Gravity, Urine: 1.027 (ref 1.005–1.030)
UROBILINOGEN UA: 1 mg/dL (ref 0.0–1.0)
pH: 6 (ref 5.0–8.0)

## 2013-07-06 LAB — URINE MICROSCOPIC-ADD ON

## 2013-07-06 LAB — CBC WITH DIFFERENTIAL/PLATELET
Basophils Absolute: 0 10*3/uL (ref 0.0–0.1)
Basophils Relative: 0 % (ref 0–1)
EOS PCT: 2 % (ref 0–5)
Eosinophils Absolute: 0.1 10*3/uL (ref 0.0–0.7)
HCT: 37.5 % (ref 36.0–46.0)
Hemoglobin: 12.6 g/dL (ref 12.0–15.0)
LYMPHS ABS: 0.9 10*3/uL (ref 0.7–4.0)
LYMPHS PCT: 14 % (ref 12–46)
MCH: 34.4 pg — ABNORMAL HIGH (ref 26.0–34.0)
MCHC: 33.6 g/dL (ref 30.0–36.0)
MCV: 102.5 fL — AB (ref 78.0–100.0)
Monocytes Absolute: 0.6 10*3/uL (ref 0.1–1.0)
Monocytes Relative: 9 % (ref 3–12)
Neutro Abs: 5 10*3/uL (ref 1.7–7.7)
Neutrophils Relative %: 76 % (ref 43–77)
PLATELETS: 267 10*3/uL (ref 150–400)
RBC: 3.66 MIL/uL — AB (ref 3.87–5.11)
RDW: 11.5 % (ref 11.5–15.5)
WBC: 6.6 10*3/uL (ref 4.0–10.5)

## 2013-07-06 LAB — POCT PREGNANCY, URINE: Preg Test, Ur: NEGATIVE

## 2013-07-06 LAB — COMPREHENSIVE METABOLIC PANEL
ALK PHOS: 51 U/L (ref 39–117)
ALT: 11 U/L (ref 0–35)
AST: 18 U/L (ref 0–37)
Albumin: 4.3 g/dL (ref 3.5–5.2)
BILIRUBIN TOTAL: 0.9 mg/dL (ref 0.3–1.2)
BUN: 14 mg/dL (ref 6–23)
CALCIUM: 9.2 mg/dL (ref 8.4–10.5)
CO2: 23 meq/L (ref 19–32)
Chloride: 103 mEq/L (ref 96–112)
Creatinine, Ser: 0.77 mg/dL (ref 0.50–1.10)
GLUCOSE: 95 mg/dL (ref 70–99)
POTASSIUM: 4 meq/L (ref 3.7–5.3)
SODIUM: 140 meq/L (ref 137–147)
Total Protein: 7.7 g/dL (ref 6.0–8.3)

## 2013-07-06 LAB — LIPASE, BLOOD: Lipase: 31 U/L (ref 11–59)

## 2013-07-06 MED ORDER — KETOROLAC TROMETHAMINE 30 MG/ML IJ SOLN
30.0000 mg | Freq: Once | INTRAMUSCULAR | Status: AC
  Administered 2013-07-06: 30 mg via INTRAMUSCULAR
  Filled 2013-07-06: qty 1

## 2013-07-06 MED ORDER — ONDANSETRON 4 MG PO TBDP: 4.0000 mg | ORAL_TABLET | Freq: Three times a day (TID) | ORAL | Status: DC | PRN

## 2013-07-06 MED ORDER — ONDANSETRON 4 MG PO TBDP
8.0000 mg | ORAL_TABLET | Freq: Once | ORAL | Status: AC
  Administered 2013-07-06: 8 mg via ORAL
  Filled 2013-07-06: qty 2

## 2013-07-06 NOTE — ED Provider Notes (Signed)
CSN: [id number]     Arrival date & time 07/06/13  1159 History   First MD Initiated Contact with Patient 07/06/13 1331     Chief Complaint  Patient presents with  . Abdominal Pain   (Consider location/radiation/quality/duration/timing/severity/associated sxs/prior Treatment) HPI Comments: Patient with acute onset of nausea, vomiting, diarrhea, chills, muscle cramps, and mild epigastric pain starting at 6 AM. Diarrhea is watery, nonbloody. Vomitus is nonbloody, nonbilious. Patient has history of appendectomy. No treatments prior to arrival. She denies chest pain, shortness of breath, urinary symptoms. The onset of this condition was acute. The course is constant. Aggravating factors: none. Alleviating factors: none.    Patient is a 23 y.o. female presenting with abdominal pain. The history is provided by the patient.  Abdominal Pain Associated symptoms: diarrhea, nausea and vomiting   Associated symptoms: no chest pain, no cough, no dysuria, no fever and no sore throat     Past Medical History  Diagnosis Date  . No pertinent past medical history   . Anemia   . Post partum depression     Advanced FOB age 71  . Abnormal Pap smear of cervix     unknown stage of abnormality  . Sickle cell trait     ? FH of [place] trait  . Hx of chlamydia infection    Past Surgical History  Procedure Laterality Date  . Dilation and curettage of uterus    . Wisdom tooth extracted x1 in 04/2011    . Laparoscopic appendectomy  06/14/2012    Procedure: APPENDECTOMY LAPAROSCOPIC;  Surgeon: Clovis Pu. Cornett, MD;  Location: [facility] OR;  Service: General;  Laterality: N/A;   Family History  Problem Relation Age of Onset  . Other Neg Hx   . Diabetes Maternal Aunt   . Kidney disease Maternal Grandmother   . Heart disease Maternal Grandmother     chf  . Hypertension Mother    History  Substance Use Topics  . Smoking status: Former Smoker -- 0.50 packs/day for 3 years    Quit date: 08/21/2011  . Smokeless  tobacco: Never Used  . Alcohol Use: No   OB History   Grav Para Term Preterm Abortions TAB SAB Ect Mult Living   4 2 2  1  1   2      Review of Systems  Constitutional: Negative for fever.  HENT: Negative for rhinorrhea and sore throat.   Eyes: Negative for redness.  Respiratory: Negative for cough.   Cardiovascular: Negative for chest pain.  Gastrointestinal: Positive for nausea, vomiting, abdominal pain and diarrhea.  Genitourinary: Negative for dysuria.  Musculoskeletal: Positive for myalgias.  Skin: Negative for rash.  Neurological: Negative for headaches.    Allergies  Review of patient's allergies indicates no known allergies.  Home Medications  No current outpatient prescriptions on file. BP 101/63  Pulse 103  Temp(Src) 97.9 F (36.6 C) (Oral)  Resp 16  Ht 5\' 7"  (1.702 m)  Wt 135 lb (61.236 kg)  BMI 21.14 kg/m2  SpO2 100% Physical Exam  Nursing note and vitals reviewed. Constitutional: She appears well-developed and well-nourished.  HENT:  Head: Normocephalic and atraumatic.  Eyes: Conjunctivae are normal. Right eye exhibits no discharge. Left eye exhibits no discharge.  Neck: Normal range of motion. Neck supple.  Cardiovascular: Normal rate, regular rhythm and normal heart sounds.   Pulmonary/Chest: Effort normal and breath sounds normal.  Abdominal: Soft. Bowel sounds are normal. She exhibits no distension. There is tenderness (mild) in the epigastric area. There  is no rebound, no guarding and no CVA tenderness.  Neurological: She is alert.  Skin: Skin is warm and dry.  Psychiatric: She has a normal mood and affect.    ED Course  Procedures (including critical care time) Labs Review Labs Reviewed  CBC WITH DIFFERENTIAL - Abnormal; Notable for the following:    RBC 3.66 (*)    MCV 102.5 (*)    MCH 34.4 (*)    All other components within normal limits  COMPREHENSIVE METABOLIC PANEL  LIPASE, BLOOD  URINALYSIS, ROUTINE W REFLEX MICROSCOPIC  POCT  PREGNANCY, URINE   Imaging Review No results found.  EKG Interpretation   None      1:38 PM Patient seen and examined. Work-up initiated. Medications ordered.   Vital signs reviewed and are as follows: Filed Vitals:   07/06/13 1202  BP: 101/63  Pulse: 103  Temp: 97.9 F (36.6 C)  Resp: 16   Pt tolerating PO's. Informed of labs. Counseled on clear liquids.   The patient was urged to return to the Emergency Department immediately with worsening of current symptoms, worsening abdominal pain, persistent vomiting, blood noted in stools, fever, or any other concerns. The patient verbalized understanding.   MDM   1. Gastroenteritis    Patient with symptoms consistent with viral gastroenteritis. Vitals are stable, no fever. No signs of dehydration, tolerating PO's. Lungs are clear. No focal abdominal pain, no concern for appendicitis, cholecystitis, pancreatitis, ruptured viscus, UTI, kidney stone, or any other abdominal etiology.  Supportive therapy indicated with return if symptoms worsen.  Patient counseled.  No dangerous or life-threatening conditions suspected or identified by history, physical exam, and by work-up. No indications for hospitalization identified.      Renne CriglerJoshua Jorgen Wolfinger, PA-C 07/06/13 1606

## 2013-07-06 NOTE — Discharge Instructions (Signed)
Please read and follow all provided instructions.  Your diagnoses today include:  1. Gastroenteritis     Tests performed today include:  Blood counts and electrolytes  Blood tests to check liver and kidney function  Blood tests to check pancreas function  Urine test to look for infection and pregnancy (in women)  Vital signs. See below for your results today.   Medications prescribed:   Zofran (ondansetron) - for nausea and vomiting  Take any prescribed medications only as directed.  Home care instructions:   Follow any educational materials contained in this packet.   Your abdominal pain, nausea, vomiting, and diarrhea may be caused by a viral gastroenteritis also called 'stomach flu'. You should rest for the next several days. Keep drinking plenty of fluids and use the medicine for nausea as directed.    Drink clear liquids for the next 24 hours and introduce solid foods slowly after 24 hours using the b.r.a.t. diet (Bananas, Rice, Applesauce, Toast, Yogurt).    Follow-up instructions: Please follow-up with your primary care provider in the next 2 days for further evaluation of your symptoms. If you are not feeling better in 48 hours you may have a condition that is more serious and you need re-evaluation. If you do not have a primary care doctor -- see below for referral information.   Return instructions:  SEEK IMMEDIATE MEDICAL ATTENTION IF:  If you have pain that does not go away or becomes severe   A temperature above 101F develops   Repeated vomiting occurs (multiple episodes)   If you have pain that becomes localized to portions of the abdomen. The right side could possibly be appendicitis. In an adult, the left lower portion of the abdomen could be colitis or diverticulitis.   Blood is being passed in stools or vomit (bright red or black tarry stools)   You develop chest pain, difficulty breathing, dizziness or fainting, or become confused, poorly responsive,  or inconsolable (young children)  If you have any other emergent concerns regarding your health  Additional Information: Abdominal (belly) pain can be caused by many things. Your caregiver performed an examination and possibly ordered blood/urine tests and imaging (CT scan, x-rays, ultrasound). Many cases can be observed and treated at home after initial evaluation in the emergency department. Even though you are being discharged home, abdominal pain can be unpredictable. Therefore, you need a repeated exam if your pain does not resolve, returns, or worsens. Most patients with abdominal pain don't have to be admitted to the hospital or have surgery, but serious problems like appendicitis and gallbladder attacks can start out as nonspecific pain. Many abdominal conditions cannot be diagnosed in one visit, so follow-up evaluations are very important.  Your vital signs today were: BP 91/58   Pulse 77   Temp(Src) 97.9 F (36.6 C) (Oral)   Resp 16   Ht 5\' 7"  (1.702 m)   Wt 135 lb (61.236 kg)   BMI 21.14 kg/m2   SpO2 98% If your blood pressure (bp) was elevated above 135/85 this visit, please have this repeated by your doctor within one month. --------------

## 2013-07-06 NOTE — ED Notes (Signed)
N/v/ abd pain that started at 6 am  States her family ahs beem soick pt has young child w/ her and she drove herself here

## 2013-07-07 LAB — URINE CULTURE

## 2013-07-07 NOTE — ED Provider Notes (Signed)
Medical screening examination/treatment/procedure(s) were performed by non-physician practitioner and as supervising physician I was immediately available for consultation/collaboration.     Jazon Jipson, MD 07/07/13 0713 

## 2013-08-12 ENCOUNTER — Emergency Department (HOSPITAL_COMMUNITY)
Admission: EM | Admit: 2013-08-12 | Discharge: 2013-08-12 | Disposition: A | Discharge status: Home / Self Care | Payer: Medicaid Other | Attending: Emergency Medicine | Admitting: Emergency Medicine

## 2013-08-12 ENCOUNTER — Encounter (HOSPITAL_COMMUNITY): Payer: Self-pay | Admitting: Emergency Medicine

## 2013-08-12 DIAGNOSIS — T23129A Burn of first degree of unspecified single finger (nail) except thumb, initial encounter: Secondary | ICD-10-CM | POA: Insufficient documentation

## 2013-08-12 DIAGNOSIS — X19XXXA Contact with other heat and hot substances, initial encounter: Secondary | ICD-10-CM | POA: Insufficient documentation

## 2013-08-12 DIAGNOSIS — Z87891 Personal history of nicotine dependence: Secondary | ICD-10-CM | POA: Insufficient documentation

## 2013-08-12 DIAGNOSIS — T300 Burn of unspecified body region, unspecified degree: Secondary | ICD-10-CM

## 2013-08-12 DIAGNOSIS — Y9389 Activity, other specified: Secondary | ICD-10-CM | POA: Insufficient documentation

## 2013-08-12 DIAGNOSIS — Y9289 Other specified places as the place of occurrence of the external cause: Secondary | ICD-10-CM | POA: Insufficient documentation

## 2013-08-12 DIAGNOSIS — Z862 Personal history of diseases of the blood and blood-forming organs and certain disorders involving the immune mechanism: Secondary | ICD-10-CM | POA: Insufficient documentation

## 2013-08-12 DIAGNOSIS — Y990 Civilian activity done for income or pay: Secondary | ICD-10-CM | POA: Insufficient documentation

## 2013-08-12 DIAGNOSIS — Z8619 Personal history of other infectious and parasitic diseases: Secondary | ICD-10-CM | POA: Insufficient documentation

## 2013-08-12 MED ORDER — ACETAMINOPHEN 500 MG PO TABS
1000.0000 mg | ORAL_TABLET | Freq: Once | ORAL | Status: AC
  Administered 2013-08-12: 1000 mg via ORAL
  Filled 2013-08-12: qty 2

## 2013-08-12 MED ORDER — HYDROCODONE-ACETAMINOPHEN 5-325 MG PO TABS: 1.0000 | ORAL_TABLET | ORAL | Status: DC | PRN

## 2013-08-12 NOTE — Discharge Instructions (Signed)
Follow up with your doctor in 2 days for reevaluation of burn. Return to Emergency department if you develop worsening pain or symptoms.   Burn Care Your skin is a natural barrier to infection. It is the largest organ of your body. Burns damage this natural protection. To help prevent infection, it is very important to follow your caregiver's instructions in the care of your burn. Burns are classified as:  First degree. There is only redness of the skin (erythema). No scarring is expected.  Second degree. There is blistering of the skin. Scarring may occur with deeper burns.  Third degree. All layers of the skin are injured, and scarring is expected. HOME CARE INSTRUCTIONS   Wash your hands well before changing your bandage.  Change your bandage as often as directed by your caregiver.  Remove the old bandage. If the bandage sticks, you may soak it off with cool, clean water.  Cleanse the burn thoroughly but gently with mild soap and water.  Pat the area dry with a clean, dry cloth.  Apply a thin layer of antibacterial cream to the burn.  Apply a clean bandage as instructed by your caregiver.  Keep the bandage as clean and dry as possible.  Elevate the affected area for the first 24 hours, then as instructed by your caregiver.  Only take over-the-counter or prescription medicines for pain, discomfort, or fever as directed by your caregiver. SEEK IMMEDIATE MEDICAL CARE IF:   You develop excessive pain.  You develop redness, tenderness, swelling, or red streaks near the burn.  The burned area develops yellowish-white fluid (pus) or a bad smell.  You have a fever. MAKE SURE YOU:   Understand these instructions.  Will watch your condition.  Will get help right away if you are not doing well or get worse. Document Released: 05/21/2005 Document Revised: 08/13/2011 Document Reviewed: 10/11/2010 [facility] Patient Information 2014 [employer], [place].
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
## 2013-08-12 NOTE — ED Notes (Signed)
Pt A&Ox4, ambulatory at discharge. 
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
## 2013-08-24 NOTE — ED Provider Notes (Signed)
Medical screening examination/treatment/procedure(s) were performed by non-physician practitioner and as supervising physician I was immediately available for consultation/collaboration.   Malana Eberwein L Onesti Bonfiglio, MD 08/24/13 1116 

## 2014-01-14 ENCOUNTER — Encounter (HOSPITAL_COMMUNITY): Payer: Self-pay

## 2014-01-16 IMAGING — US US FETAL BPP W/O NONSTRESS
1 series · 7 of 7 positions shown · non-contrast
Comparison: none

CLINICAL DATA: Decreased fetal movement

BIOPHYSICAL PROFILE
Number of Fetuses: 1
Heart Rate: 142 bpm
Presentation: Cephalic
Movement: Visualized.
Amniotic Fluid (Subjective):  Normal
Vertical pocket:  5.8 cm
BPP:
Movement:  2      Time:  8 minutes
Breathing: 2
Tone:   2
Amniotic Fluid:  2
Total Score:  8

[Series 1: us fetal bpp w/o nonstress · non-contrast · 7 acquisitions, 7 frames shown]
[im 1/7]
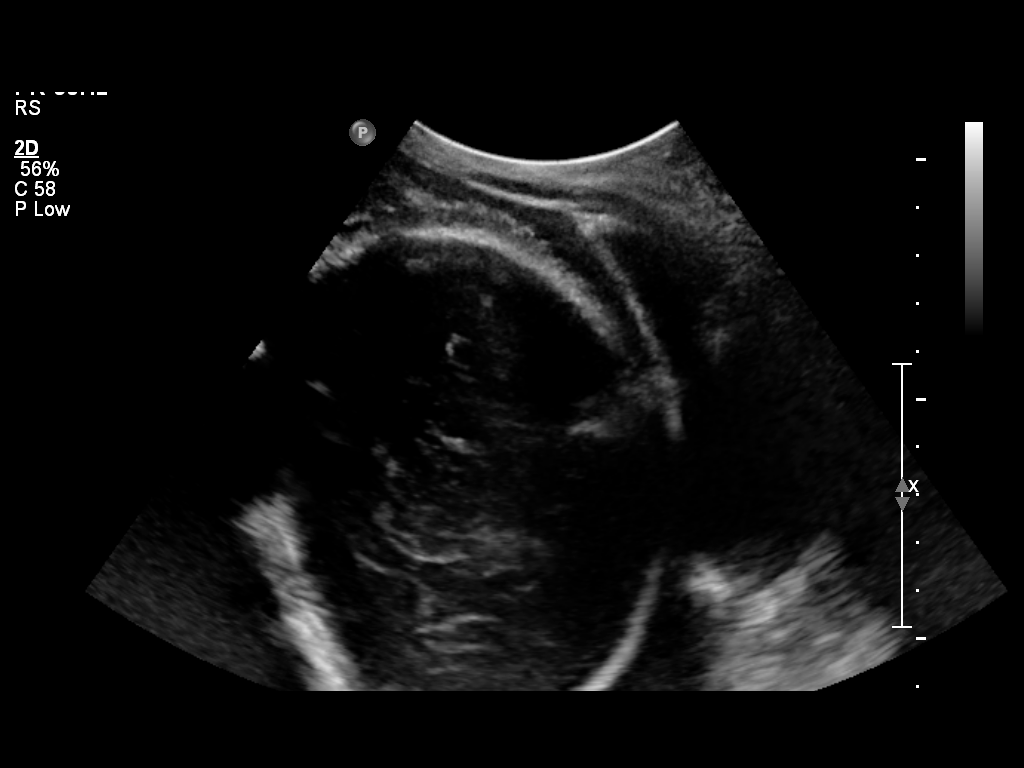
[im 2/7]
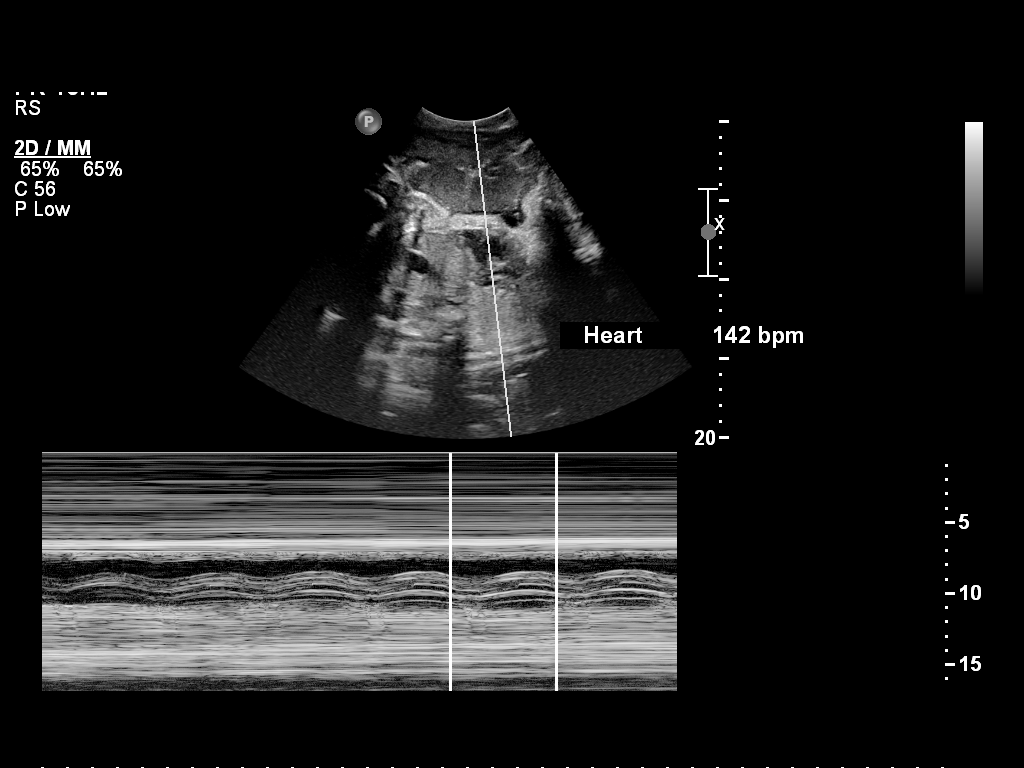
[im 3/7]
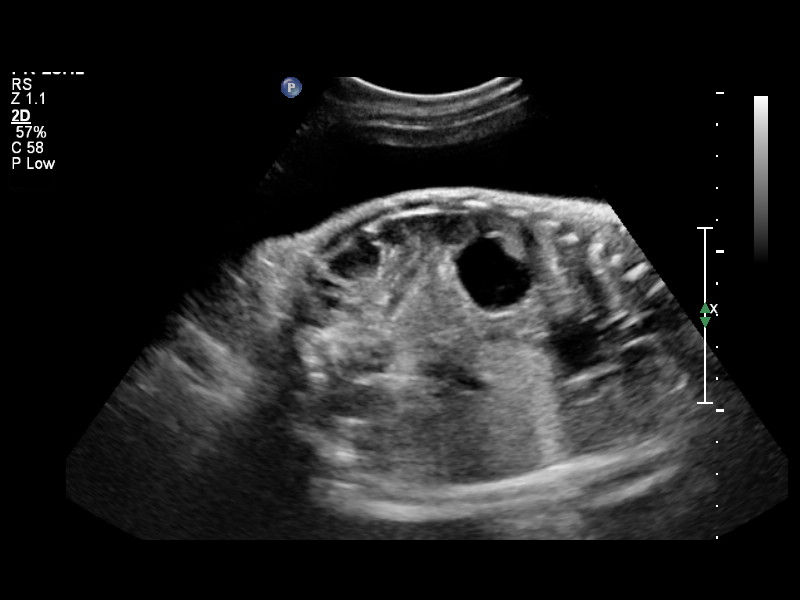
[im 4/7]
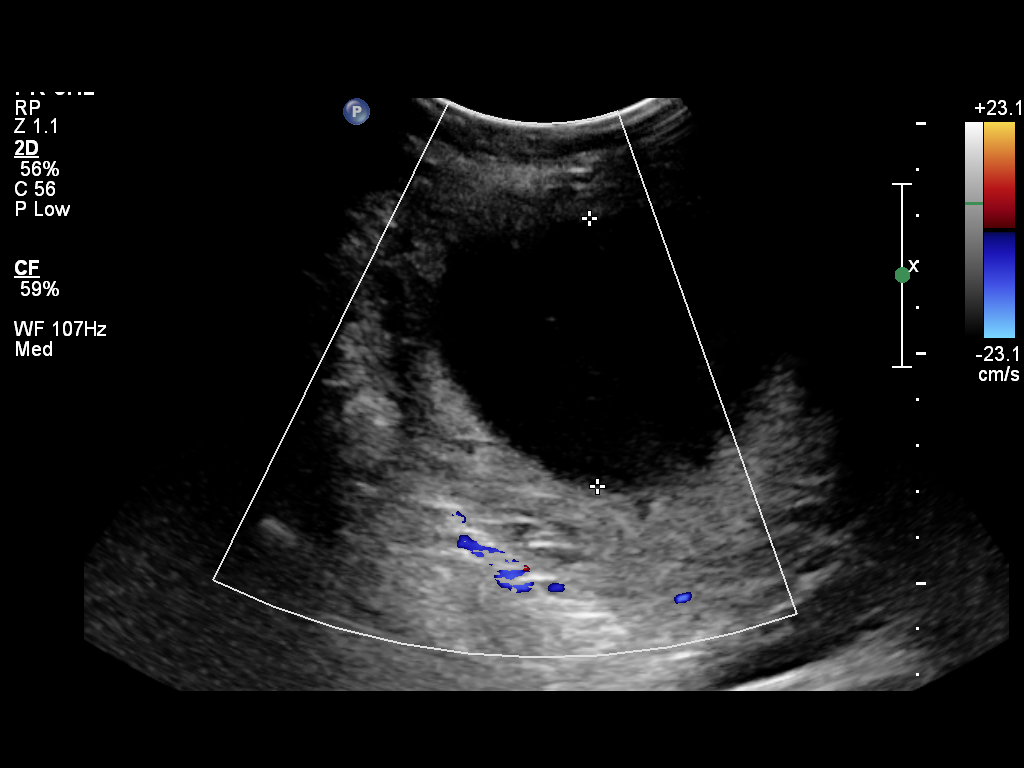
[im 5/7]
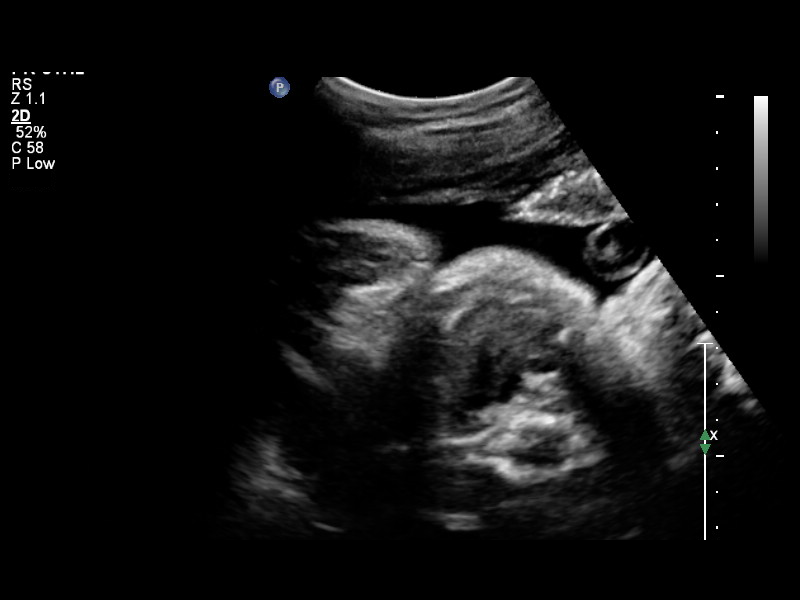
[im 6/7]
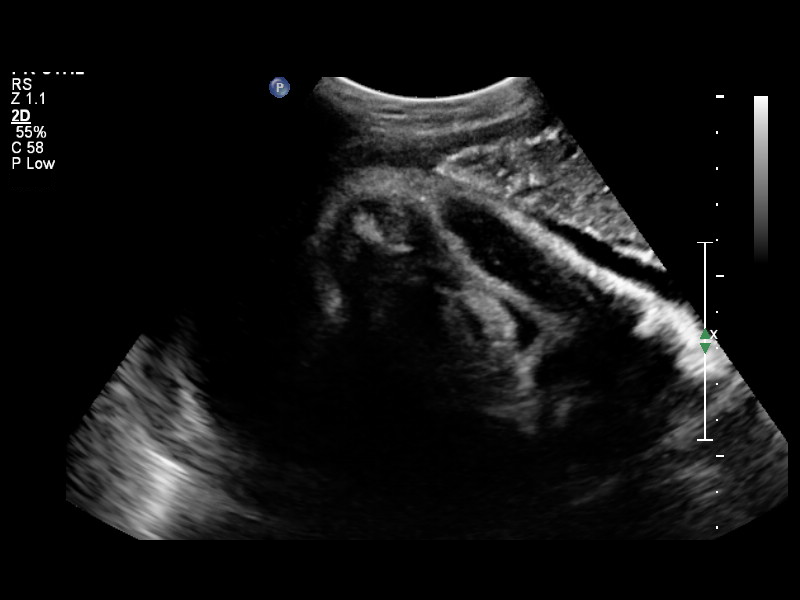
[im 7/7]
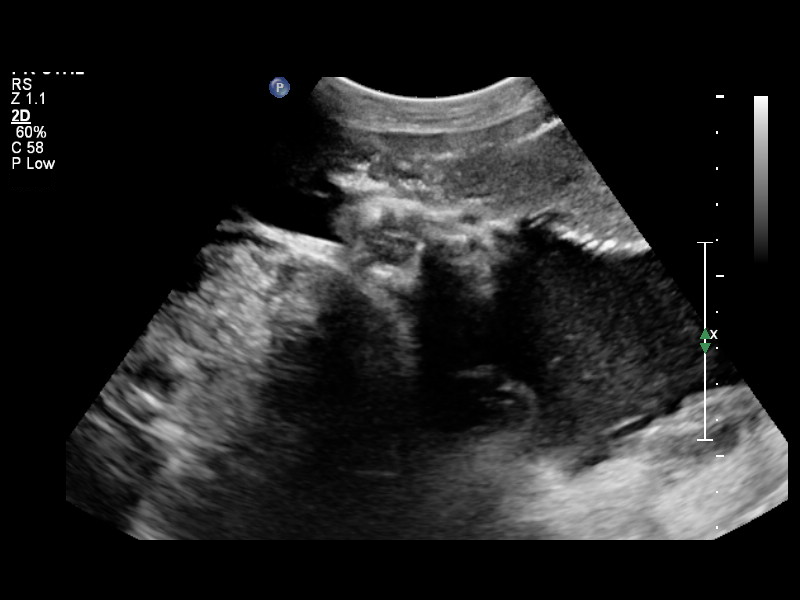

[7 of 7 positions shown; findings below may reference images not displayed]

IMPRESSION: As above.

Recommend followup with non-emergent complete OB 14+ wk US
examination for fetal biometric evaluation and anatomic survey if
not already performed.

## 2014-03-03 ENCOUNTER — Emergency Department (INDEPENDENT_AMBULATORY_CARE_PROVIDER_SITE_OTHER)
Admission: EM | Admit: 2014-03-03 | Discharge: 2014-03-03 | Disposition: A | Discharge status: Home / Self Care | Payer: Medicaid Other | Source: Home / Self Care | Attending: Emergency Medicine | Admitting: Emergency Medicine

## 2014-03-03 ENCOUNTER — Encounter (HOSPITAL_COMMUNITY): Payer: Self-pay | Admitting: Emergency Medicine

## 2014-03-03 DIAGNOSIS — N3000 Acute cystitis without hematuria: Secondary | ICD-10-CM

## 2014-03-03 DIAGNOSIS — N3001 Acute cystitis with hematuria: Secondary | ICD-10-CM

## 2014-03-03 LAB — POCT PREGNANCY, URINE: PREG TEST UR: NEGATIVE

## 2014-03-03 LAB — POCT URINALYSIS DIP (DEVICE)
BILIRUBIN URINE: NEGATIVE
GLUCOSE, UA: NEGATIVE mg/dL
Ketones, ur: NEGATIVE mg/dL
NITRITE: NEGATIVE
Protein, ur: 100 mg/dL — AB
Specific Gravity, Urine: 1.02 (ref 1.005–1.030)
Urobilinogen, UA: 2 mg/dL — ABNORMAL HIGH (ref 0.0–1.0)
pH: 7 (ref 5.0–8.0)

## 2014-03-03 MED ORDER — CEPHALEXIN 500 MG PO CAPS: 500.0000 mg | ORAL_CAPSULE | Freq: Three times a day (TID) | ORAL | Status: DC

## 2014-03-03 MED ORDER — PHENAZOPYRIDINE HCL 200 MG PO TABS: 200.0000 mg | ORAL_TABLET | Freq: Three times a day (TID) | ORAL | Status: DC | PRN

## 2014-03-03 NOTE — Discharge Instructions (Signed)

## 2014-03-03 NOTE — ED Provider Notes (Signed)
Chief Complaint   Flank Pain   History of Present Illness   Jennifer Peters is a 23 year old female who has had a three-day history of dysuria, tingling urination, frequency, urgency, and hematuria. She has had pain in the lower abdomen, lower back, and left flank area. She denies fever, chills, nausea, or vomiting. No GYN complaints. Last menstrual period was September 25. She is sexually active without use of birth control. No prior history of urinary tract infections.  Review of Systems   Other than as noted above, the patient denies any of the following symptoms: General:  No fevers or chills. GI:  No abdominal pain, back pain, nausea, or vomiting. GU:  No hematuria or incontinence. GYN:  No discharge, itching, vulvar pain or lesions, pelvic pain, or abnormal vaginal bleeding.  PMFSH   Past medical history, family history, social history, meds, and allergies were reviewed.    Physical Examination     Vital signs:  BP 110/65  Pulse 78  Temp(Src) 98.1 F (36.7 C) (Oral)  SpO2 100%  LMP 02/26/2014  Breastfeeding? No Gen:  Alert, oriented, in no distress. Lungs:  Clear to auscultation, no wheezes, rales or rhonchi. Heart:  Regular rhythm, no gallop or murmer. Abdomen:  Flat and soft. There was slight suprapubic pain to palpation.  No guarding, or rebound.  No hepato-splenomegaly or mass.  Bowel sounds were normally active.  No hernia. Back:  No CVA tenderness.  Skin:  Clear, warm and dry.  Labs   Results for orders placed during the hospital encounter of 03/03/14  POCT URINALYSIS DIP (DEVICE)      Result Value Ref Range   Glucose, UA NEGATIVE  NEGATIVE mg/dL   Bilirubin Urine NEGATIVE  NEGATIVE   Ketones, ur NEGATIVE  NEGATIVE mg/dL   Specific Gravity, Urine 1.020  1.005 - 1.030   Hgb urine dipstick MODERATE (*) NEGATIVE   pH 7.0  5.0 - 8.0   Protein, ur 100 (*) NEGATIVE mg/dL   Urobilinogen, UA 2.0 (*) 0.0 - 1.0 mg/dL   Nitrite NEGATIVE  NEGATIVE   Leukocytes, UA  LARGE (*) NEGATIVE  POCT PREGNANCY, URINE      Result Value Ref Range   Preg Test, Ur NEGATIVE  NEGATIVE   A urine culture was obtained.  Results are pending at this time and we will call about any positive results.  Assessment   The encounter diagnosis was Acute cystitis with hematuria.   No evidence of pyelonephritis.  Plan   1.  Meds:  The following meds were prescribed:   Discharge Medication List as of 03/03/2014  5:59 PM    START taking these medications   Details  cephALEXin (KEFLEX) 500 MG capsule Take 1 capsule (500 mg total) by mouth 3 (three) times daily., Starting 03/03/2014, Until Discontinued, Normal    phenazopyridine (PYRIDIUM) 200 MG tablet Take 1 tablet (200 mg total) by mouth 3 (three) times daily as needed for pain., Starting 03/03/2014, Until Discontinued, Normal        2.  Patient Education/Counseling:  The patient was given appropriate handouts, self care instructions, and instructed in symptomatic relief. The patient was told to avoid intercourse for 10 days, get extra fluids, and return for a follow up with her primary care doctor at the completion of treatment for a repeat UA and culture.    3.  Follow up:  The patient was told to follow up here if no better in 3 to 4 days, or sooner if becoming worse in  any way, and given some red flag symptoms such as fever, persistent vomiting, or severe flank or abdominal pain which would prompt immediate return.     Reuben Likesavid C Jackilyn Umphlett, MD 03/03/14 2025

## 2014-03-04 LAB — URINE CULTURE
Colony Count: >=100000
SPECIAL REQUESTS: NORMAL

## 2014-04-05 ENCOUNTER — Encounter (HOSPITAL_COMMUNITY): Payer: Self-pay | Admitting: Emergency Medicine

## 2014-05-23 ENCOUNTER — Emergency Department (HOSPITAL_COMMUNITY)
Admission: EM | Admit: 2014-05-23 | Discharge: 2014-05-23 | Disposition: A | Discharge status: Home / Self Care | Payer: Medicaid Other | Attending: Emergency Medicine | Admitting: Emergency Medicine

## 2014-05-23 ENCOUNTER — Encounter (HOSPITAL_COMMUNITY): Payer: Self-pay | Admitting: Nurse Practitioner

## 2014-05-23 DIAGNOSIS — Z8659 Personal history of other mental and behavioral disorders: Secondary | ICD-10-CM | POA: Insufficient documentation

## 2014-05-23 DIAGNOSIS — M549 Dorsalgia, unspecified: Secondary | ICD-10-CM

## 2014-05-23 DIAGNOSIS — J029 Acute pharyngitis, unspecified: Secondary | ICD-10-CM | POA: Insufficient documentation

## 2014-05-23 DIAGNOSIS — M546 Pain in thoracic spine: Secondary | ICD-10-CM | POA: Insufficient documentation

## 2014-05-23 DIAGNOSIS — Z87891 Personal history of nicotine dependence: Secondary | ICD-10-CM | POA: Insufficient documentation

## 2014-05-23 DIAGNOSIS — M545 Low back pain: Secondary | ICD-10-CM | POA: Insufficient documentation

## 2014-05-23 DIAGNOSIS — Z79899 Other long term (current) drug therapy: Secondary | ICD-10-CM | POA: Insufficient documentation

## 2014-05-23 DIAGNOSIS — Z8619 Personal history of other infectious and parasitic diseases: Secondary | ICD-10-CM | POA: Insufficient documentation

## 2014-05-23 DIAGNOSIS — Z862 Personal history of diseases of the blood and blood-forming organs and certain disorders involving the immune mechanism: Secondary | ICD-10-CM | POA: Insufficient documentation

## 2014-05-23 LAB — RAPID STREP SCREEN (MED CTR MEBANE ONLY): Streptococcus, Group A Screen (Direct): NEGATIVE

## 2014-05-23 MED ORDER — DIPHENHYDRAMINE HCL 25 MG PO TABS: 25.0000 mg | ORAL_TABLET | Freq: Four times a day (QID) | ORAL | Status: DC

## 2014-05-23 MED ORDER — NAPROXEN 500 MG PO TABS: 500.0000 mg | ORAL_TABLET | Freq: Two times a day (BID) | ORAL | Status: DC

## 2014-05-23 NOTE — Discharge Instructions (Signed)
You were evaluated for your sore throat and back pain. There does not appear to be any acute infections or other emergent causes of your symptoms. Please take benadryl and salt water gargles for your sore throat and Naproxen as directed for your back pain. Follow up with primary care for further evaluation and management of your symptoms.   Emergency Department Resource Guide 1) Find a Doctor and Pay Out of Pocket Although you won't have to find out who is covered by your insurance plan, it is a good idea to ask around and get recommendations. You will then need to call the office and see if the doctor you have chosen will accept you as a new patient and what types of options they offer for patients who are self-pay. Some doctors offer discounts or will set up payment plans for their patients who do not have insurance, but you will need to ask so you aren't surprised when you get to your appointment.  2) Contact Your Local Health Department Not all health departments have doctors that can see patients for sick visits, but many do, so it is worth a call to see if yours does. If you don't know where your local health department is, you can check in your phone book. The CDC also has a tool to help you locate your state's health department, and many state websites also have listings of all of their local health departments.  3) Find a Walk-in Clinic If your illness is not likely to be very severe or complicated, you may want to try a walk in clinic. These are popping up all over the country in pharmacies, drugstores, and shopping centers. They're usually staffed by nurse practitioners or physician assistants that have been trained to treat common illnesses and complaints. They're usually fairly quick and inexpensive. However, if you have serious medical issues or chronic medical problems, these are probably not your best option.  No Primary Care Doctor: - Call Health Connect at  [phone] - they can help  you locate a primary care doctor that  accepts your insurance, provides certain services, etc. - Physician Referral Service- [phone]  Chronic Pain Problems: Organization         Address  Phone   Notes  [facility]  [phone]) [phone] Patients need to be referred by their primary care doctor.   Medication Assistance: Organization         Address  Phone   Notes  [facility] [address] [place], [place] [address] [phone] --Must be a resident of [facility] -- Must have NO insurance coverage whatsoever (no Medicaid/ Medicare, etc.) -- The pt. MUST have a primary care doctor that directs their care regularly and follows them in the community   MedAssist  [phone]) [phone]   [employer]  [phone]    Agencies that provide inexpensive medical care: Organization         Address  Phone   Notes  [facility]  [phone]   [facility]    [phone]) [phone]   [facility] [address] [place], [place] [address] [phone]) [phone]   [facility] [address], [place] [phone]   [facility]    [phone](336) [phone]   [facility]    [phone]   [facility]  [address], [place] Phone:  [phone], Fax:  [phone] Hours of Operation:  9 am - 6 pm, M-F.  Also accepts Medicaid/Medicare and self-pay.  [facility] for [place] [address], [address], [place] Phone: [phone], Fax: [phone]. Hours of Operation:  8:30 am - 5:30 pm, M-F.  Also accepts Medicaid and self-pay.  [facility] [place] [address], [place] Phone: [phone]   [place], [place], [place] [phone], Ext. [phone] Mondays & Thursdays: 7-9 AM.  First 15 patients are seen on a first come, first serve basis.    [place] Providers:  Organization         Address  Phone   Notes  [facility] [address], [place] [phone] Also accepts self-pay patients.  [facility] [address], [place]  [phone]   [place], [address], [place] [phone]   [facility] [address], [place] [phone]   Lucianne Lei [address], [place]   [phone] Only accepts [place] Access [place] patients after they have their name applied to their card.   Self-Pay (no insurance) in [facility]:  Organization         Address  Phone   Notes  Sickle Cell Patients, [facility] [place] [phone]   [facility] [place] [phone]   [facility] [place]  [place], [place], [place] [phone]   [facility]/Dr. Osei-Bonsu  [address], [place] or [address], [place] [phone] Phone number for both [place] and [place] locations is the same.  [facility]gent [facility] [address], [place] [phone]   [facility] [address] [place] or [address] [phone]   [facility] [address] [place] [phone], phone; [phone], fax Sees patients 1st and 3rd Saturday of every month.  Must not qualify for public or private insurance (i.e. Medicaid, Medicare, [place] Health Choice, Veterans' Benefits)  Household income should be no more than 200% of the poverty level The clinic cannot treat you if you are pregnant or think you are pregnant  Sexually transmitted diseases are not treated at the clinic.    Dental Care: Organization         Address  Phone  Notes  [facility] Department of [facility] [place] [phone] Accepts children up to age 28 who are enrolled in [place] or [place]; pregnant women with a Medicaid card; and children who have applied for Medicaid or [place] Health Choice, but were declined, whose parents can pay a reduced fee at time of service.  [facility] Department of [facility]  [address], [place] [phone] Accepts children up to age 84 who are enrolled in [place] or [place]; pregnant women with a Medicaid card; and children who have applied for Medicaid or [place] Health Choice, but were declined, whose parents can pay a reduced fee at time of service.  [facility]  [place] [phone] Patients are seen by appointment only. Walk-ins are not accepted. [place]  [facility] will see patients 75 years of age and older. Monday - Tuesday (8am-5pm) Most Wednesdays (8:30-5pm) $30 per visit, cash only  [facility] Adult Dental Access PROGRAM  [address], [facility] [phone] Patients are seen by appointment only. Walk-ins are not accepted. [place] will see patients 30 years of age and older. One Wednesday Evening (Monthly: Volunteer Based).  $30 per visit, cash only  [place]  [phone] for adults; Children under age 14, call Graduate Pediatric Dentistry at [phone]. Children aged 46-14, please call [phone] to request a pediatric application.  Dental services are provided in all areas of dental care including fillings, crowns and bridges, complete and partial dentures, implants, gum treatment, root canals, and extractions. Preventive care is also provided. Treatment is provided to both adults and children. Patients are selected via a lottery and there is often a waiting list.   [facility] [address] [place]  [phone] [email or web address]   [facility] [address] [place], [place] [phone], Ext. [phone] Second and Fourth Thursday of each month, opens at 6:30 AM; Clinic ends at 9 AM.  Patients are seen on a first-come first-served basis, and a limited number are seen during each clinic.   [facility]  [address] [place], [place] [phone]   Eligibility Requirements You must have lived in [place], [place], or [place] counties for at least the last three months.   You cannot be eligible for state or federal sponsored [employer], including [employer], [place], or [employer].   You generally cannot be eligible for healthcare insurance through your employer.    How to apply: Eligibility screenings are held every Tuesday and Wednesday afternoon from 1:00 pm until 4:00 pm. You do not need an appointment for the interview!  [facility] [address] [place], [place]   [facility]  [place] Department  [place]  [phone]    Behavioral Health Resources in the Community: Intensive Outpatient Programs Organization         Address  Phone  Notes  [place] [address], [place], [place] [phone]   [facility] [address], [place], [place]   [facility] [address], [place], [place]   [place] [address]  [place], [place] or [phone]   Substance Abuse Resources Organization         Address  Phone  Notes  Al[facility] Drug Services  [phone]   [place]  [phone]   [facility] [place]   Chinita Pester  [phone]   Residential & Outpatient Substance Abuse Program  [phone]   Psychological Services Organization         Address  Phone  Notes  [facility] [place]  [place]  [phone]   [place] [address] [place] or [phone]    Mobile Crisis Teams Organization         Address  Phone  Notes  [facility], Mobile Crisis Care Unit  [phone]   [facility]  [address] [place], [place]   [facility] [address] [place] [phone]    Self-Help/Support Groups Organization  Address  Phone             Notes  °[facility] [place] - variety of support groups  [phone]- [phone] Call for more information  °Narcotics Anonymous (NA), [facility] [address], °[place]  2 meetings at this location  ° °Residential Treatment Programs °Organization         Address  Phone  Notes  °[facility] [address],    °[place]  [phone]   °[facility] ° [address], [place], [place] [phone]   °[facility] [address], [place] [phone] Admissions: 8am-3pm M-F  °[facility] [address]    °[place], [place] [phone]   °[facility] [address], [place], [place] [phone]   °[facility] [address]  °[place], [place] [phone]   °[facility] - Intensive Outpatient [address], [place], [place] [phone]   °[facility] [address]  °[place], [place] 1-877-615-2722 or [phone]   °[facility] ([facility]) [address] [place], [place] [phone] Accepts Medicaid  °[facility] [address]  °[place] 1-800-659-3381 Substance Abuse/Addiction Treatment  ° °[place] Behavioral Health Resources °Organization         Address  Phone  Notes  °[facility]  ([phone]) [phone]   °Julie Brannon, PhD [address] [place], [place]   ([phone]) [phone] or  (336) [phone]   ° Behavioral   [address] °[place], [place] ([phone]) [phone]   °[facility] [address], [place], [place] ([phone]) [phone] Insurance/Medicaid/sponsorship through [facility]  °[facility] [address]                                    [place], [place] ([phone]) [phone] Therapy/tele-psych/case  °[facility] [address]  ° [place], [place] (336) [phone]    °Dr. Arfeen  ([phone]) [phone]   °[facility] [place]  [employer] [place] [facility] 1) [address],  °2) [address], [place] °3)  [address], [place] (336) [phone] °(336) [phone] ° °(336) [phone]   °[place] Child Abuse Hotline (336) 342-1394 or (336) 342-3537 (After Hours)    ° ° ° °

## 2014-05-23 NOTE — ED Notes (Signed)
shes had a sore throat, back pain and body aches since Thursday.

## 2014-05-23 NOTE — ED Provider Notes (Signed)
CSN: [id number]     Arrival date & time 05/23/14  1552 History   First MD Initiated Contact with Patient 05/23/14 1612     Chief Complaint  Patient presents with  . Sore Throat     (Consider location/radiation/quality/duration/timing/severity/associated sxs/prior Treatment) HPI Jennifer Peters is a 23 y.o. female comes in for evaluation of sore throat and back pain. States throat has gotten progressively itchy and sore since Friday. Has not tried anything for this. Denies fevers, rhinorrhea, cough, SOB, difficulties eating or drinking.  She also reports back pain for the past week. She characterizes a diffuse "soreness" in the middle of her back. Cannot recall any specific injury or trauma. Has not tried anything for symptoms.No numbness or weakness, no saddle parasthesias, no other pathologic back pain red flags.  Past Medical History  Diagnosis Date  . No pertinent past medical history   . Anemia   . Post partum depression     Advanced FOB age 23  . Abnormal Pap smear of cervix     unknown stage of abnormality  . Sickle cell trait     ? FH of [place] trait  . Hx of chlamydia infection    Past Surgical History  Procedure Laterality Date  . Dilation and curettage of uterus    . Wisdom tooth extracted x1 in 04/2011    . Laparoscopic appendectomy  06/14/2012    Procedure: APPENDECTOMY LAPAROSCOPIC;  Surgeon: Clovis Puhomas A. Cornett, MD;  Location: [facility] OR;  Service: General;  Laterality: N/A;   Family History  Problem Relation Age of Onset  . Other Neg Hx   . Diabetes Maternal Aunt   . Kidney disease Maternal Grandmother   . Heart disease Maternal Grandmother     chf  . Hypertension Mother    History  Substance Use Topics  . Smoking status: Former Smoker -- 0.50 packs/day for 3 years    Quit date: 08/21/2011  . Smokeless tobacco: Never Used  . Alcohol Use: No   OB History    Gravida Para Term Preterm AB TAB SAB Ectopic Multiple Living   4 2 2  1  1   2      Review of Systems   Constitutional: Negative for fever.  HENT: Positive for sore throat.   Eyes: Negative for visual disturbance.  Respiratory: Negative for shortness of breath.   Cardiovascular: Negative for chest pain.  Gastrointestinal: Negative for abdominal pain.  Endocrine: Negative for polyuria.  Genitourinary: Negative for dysuria.  Musculoskeletal: Positive for back pain.  Skin: Negative for rash.  Neurological: Negative for headaches.      Allergies  Review of patient's allergies indicates no known allergies.  Home Medications   Prior to Admission medications   Medication Sig Start Date End Date Taking? Authorizing Provider  Multiple Vitamin (MULTIVITAMIN WITH MINERALS) TABS tablet Take 1 tablet by mouth daily.   Yes Historical Provider, MD  cephALEXin (KEFLEX) 500 MG capsule Take 1 capsule (500 mg total) by mouth 3 (three) times daily. Patient not taking: Reported on 05/23/2014 03/03/14   Reuben Likesavid C Keller, MD  diphenhydrAMINE (BENADRYL) 25 MG tablet Take 1 tablet (25 mg total) by mouth every 6 (six) hours. 05/23/14   Sharlene MottsBenjamin W Remedy Corporan, PA-C  HYDROcodone-acetaminophen (NORCO/VICODIN) 5-325 MG per tablet Take 1-2 tablets by mouth every 4 (four) hours as needed. Patient not taking: Reported on 05/23/2014 08/12/13   Rudene AndaJacob Gray Lackey, PA-C  naproxen (NAPROSYN) 500 MG tablet Take 1 tablet (500 mg total) by mouth 2 (two)  times daily. 05/23/14   Sharlene MottsBenjamin W Nasiir Monts, PA-C  phenazopyridine (PYRIDIUM) 200 MG tablet Take 1 tablet (200 mg total) by mouth 3 (three) times daily as needed for pain. Patient not taking: Reported on 05/23/2014 03/03/14   Reuben Likesavid C Keller, MD   BP 99/65 mmHg  Pulse 96  Temp(Src) 98.1 F (36.7 C) (Oral)  Resp 12  Ht 5\' 2"  (1.575 m)  Wt 129 lb (58.514 kg)  BMI 23.59 kg/m2  SpO2 100% Physical Exam  Constitutional:  Awake, alert, nontoxic appearance with baseline speech.  HENT:  Head: Atraumatic.  MMM, no erythema, no unilateral tonsillar swelling. No trismus or glossal  elevation. No exudates  Eyes: Pupils are equal, round, and reactive to light. Right eye exhibits no discharge. Left eye exhibits no discharge.  Neck: Neck supple.  Cardiovascular: Normal rate and regular rhythm.   No murmur heard. Pulmonary/Chest: Effort normal and breath sounds normal. No respiratory distress. She has no wheezes. She has no rales. She exhibits no tenderness.  Abdominal: Soft. Bowel sounds are normal. She exhibits no mass. There is no tenderness. There is no rebound.  Musculoskeletal:  Mild tenderness to thoracic and lumbar paraspinal muscles with no midline bony tenderness. Bilateral lower extremities non tender without new rashes or color change, baseline ROM with intact DP / PT pulses, sensation baseline light touch bilaterally for pt, motor symmetric bilateral 5 / 5 hip flexion, quadriceps, hamstrings, EHL, foot dorsiflexion, foot plantarflexion, gait without apparent new ataxia.  Neurological:  Mental status baseline for patient.  Upper extremity motor strength and sensation intact and symmetric bilaterally.  Skin: No rash noted.  Psychiatric: She has a normal mood and affect.  Nursing note and vitals reviewed.   ED Course  Procedures (including critical care time) Labs Review Labs Reviewed  RAPID STREP SCREEN  CULTURE, GROUP A STREP    Imaging Review No results found.   EKG Interpretation None     Filed Vitals:   05/23/14 1606 05/23/14 1744  BP: 106/63 99/65  Pulse: 80 96  Temp: 98.1 F (36.7 C)   TempSrc: Oral   Resp: 15 12  Height: 5\' 2"  (1.575 m)   Weight: 129 lb (58.514 kg)   SpO2: 100% 100%    MDM  Jennifer Peters is a 23 y.o. female who comes in for evaluation of sore throat and back pain. Sore throat that has been itching since Friday, back pain that is diffuse and characterizes soreness and bilateral thoracic muscles. Has not tried anything at home for her symptoms.  Vitals stable  -afebrile Pt resting comfortably in ED. PE--not concerning  for other acute or emergent pathology. No evidence of PTA, [place] or vincent angina. No cervical adenopathy. Mild diffuse back tenderness with no focal tenderness, no midline bony tenderness. No pathologic back pain red flags. Normal neuro exam. Gait is baseline with no ataxia, moves all 4 extremities without difficulty. Rapid strep negative Will DC with Benadryl for potential allergic pharyngitis. Instructions for salt water gargle. Naproxen for back discomfort. Resource guide given in order to find and establish care with PCP. Discussed f/u with PCP and return precautions, pt very amenable to plan. Patient stable, in good condition and is appropriate for discharge   Final diagnoses:  Sore throat  Bilateral back pain, unspecified location       Sharlene MottsBenjamin W Naome Brigandi, PA-C 05/23/14 1755  Flint MelterElliott L [place], MD 05/24/14 0002

## 2014-05-25 LAB — CULTURE, GROUP A STREP

## 2014-05-28 ENCOUNTER — Emergency Department (HOSPITAL_COMMUNITY)
Admission: EM | Admit: 2014-05-28 | Discharge: 2014-05-28 | Disposition: A | Discharge status: Home / Self Care | Payer: Medicaid Other | Attending: Emergency Medicine | Admitting: Emergency Medicine

## 2014-05-28 ENCOUNTER — Encounter (HOSPITAL_COMMUNITY): Payer: Self-pay | Admitting: Emergency Medicine

## 2014-05-28 DIAGNOSIS — Z87891 Personal history of nicotine dependence: Secondary | ICD-10-CM | POA: Insufficient documentation

## 2014-05-28 DIAGNOSIS — Z79899 Other long term (current) drug therapy: Secondary | ICD-10-CM | POA: Insufficient documentation

## 2014-05-28 DIAGNOSIS — Z3202 Encounter for pregnancy test, result negative: Secondary | ICD-10-CM | POA: Insufficient documentation

## 2014-05-28 DIAGNOSIS — Z8619 Personal history of other infectious and parasitic diseases: Secondary | ICD-10-CM | POA: Insufficient documentation

## 2014-05-28 DIAGNOSIS — Z9889 Other specified postprocedural states: Secondary | ICD-10-CM | POA: Insufficient documentation

## 2014-05-28 DIAGNOSIS — N390 Urinary tract infection, site not specified: Secondary | ICD-10-CM | POA: Insufficient documentation

## 2014-05-28 LAB — URINALYSIS, ROUTINE W REFLEX MICROSCOPIC
Bilirubin Urine: NEGATIVE
Glucose, UA: NEGATIVE mg/dL
Ketones, ur: NEGATIVE mg/dL
Nitrite: POSITIVE — AB
Protein, ur: 100 mg/dL — AB
Specific Gravity, Urine: 1.015 (ref 1.005–1.030)
Urobilinogen, UA: 1 mg/dL (ref 0.0–1.0)
pH: 6 (ref 5.0–8.0)

## 2014-05-28 LAB — URINE MICROSCOPIC-ADD ON

## 2014-05-28 LAB — COMPREHENSIVE METABOLIC PANEL
ALBUMIN: 4.4 g/dL (ref 3.5–5.2)
ALK PHOS: 46 U/L (ref 39–117)
ALT: 19 U/L (ref 0–35)
AST: 21 U/L (ref 0–37)
Anion gap: 11 (ref 5–15)
BILIRUBIN TOTAL: 0.8 mg/dL (ref 0.3–1.2)
BUN: 11 mg/dL (ref 6–23)
CHLORIDE: 104 meq/L (ref 96–112)
CO2: 21 mmol/L (ref 19–32)
Calcium: 9 mg/dL (ref 8.4–10.5)
Creatinine, Ser: 0.82 mg/dL (ref 0.50–1.10)
GFR calc Af Amer: >90 mL/min (ref >90)
GFR calc non Af Amer: >90 mL/min (ref >90)
Glucose, Bld: 85 mg/dL (ref 70–99)
POTASSIUM: 3.6 mmol/L (ref 3.5–5.1)
Sodium: 136 mmol/L (ref 135–145)
Total Protein: 7.5 g/dL (ref 6.0–8.3)

## 2014-05-28 LAB — CBC WITH DIFFERENTIAL/PLATELET
Basophils Absolute: 0 K/uL (ref 0.0–0.1)
Basophils Relative: 0 % (ref 0–1)
Eosinophils Absolute: 0.1 K/uL (ref 0.0–0.7)
Eosinophils Relative: 1 % (ref 0–5)
HCT: 35.4 % — ABNORMAL LOW (ref 36.0–46.0)
Hemoglobin: 11.7 g/dL — ABNORMAL LOW (ref 12.0–15.0)
Lymphocytes Relative: 24 % (ref 12–46)
Lymphs Abs: 1.8 K/uL (ref 0.7–4.0)
MCH: 33.1 pg (ref 26.0–34.0)
MCHC: 33.1 g/dL (ref 30.0–36.0)
MCV: 100.3 fL — ABNORMAL HIGH (ref 78.0–100.0)
Monocytes Absolute: 0.8 K/uL (ref 0.1–1.0)
Monocytes Relative: 10 % (ref 3–12)
Neutro Abs: 4.9 K/uL (ref 1.7–7.7)
Neutrophils Relative %: 65 % (ref 43–77)
Platelets: 272 K/uL (ref 150–400)
RBC: 3.53 MIL/uL — ABNORMAL LOW (ref 3.87–5.11)
RDW: 11.4 % — ABNORMAL LOW (ref 11.5–15.5)
WBC: 7.5 K/uL (ref 4.0–10.5)

## 2014-05-28 LAB — LIPASE, BLOOD: Lipase: 28 U/L (ref 11–59)

## 2014-05-28 LAB — PREGNANCY, URINE: Preg Test, Ur: NEGATIVE

## 2014-05-28 MED ORDER — ONDANSETRON HCL 4 MG/2ML IJ SOLN
4.0000 mg | Freq: Once | INTRAMUSCULAR | Status: DC
  Filled 2014-05-28: qty 2

## 2014-05-28 MED ORDER — HYDROCODONE-ACETAMINOPHEN 5-325 MG PO TABS: 1.0000 | ORAL_TABLET | ORAL | Status: DC | PRN

## 2014-05-28 MED ORDER — MORPHINE SULFATE 4 MG/ML IJ SOLN
4.0000 mg | Freq: Once | INTRAMUSCULAR | Status: DC
  Filled 2014-05-28: qty 1

## 2014-05-28 MED ORDER — CEPHALEXIN 500 MG PO CAPS: 500.0000 mg | ORAL_CAPSULE | Freq: Four times a day (QID) | ORAL | Status: DC

## 2014-05-28 NOTE — ED Notes (Signed)
Pt reports to ed with complaint of bilateral flank pain that radiates into the abdomen for a few weeks and today the pain worsened. Denies urinary symptoms. Denies nausea/vomiting. Alert and oriented x4. Resting comfortably appears to be in no apparent distress.

## 2014-05-28 NOTE — ED Provider Notes (Signed)
CSN: [id number]     Arrival date & time 05/28/14  1157 History   First MD Initiated Contact with Patient 05/28/14 1228     Chief Complaint  Patient presents with  . Flank Pain   Jennifer SettingMaria Peters is a 23 y.o. G2P2 female who presents the emergency department complaining of left upper quadrant abdominal pain for the past 3 weeks which has become worse yesterday. Patient rates her pain at 9 out of 10 in her left upper quadrant and describes it as sharp. She reports anything makes it worse including movement or talking. Tylenol makes it somewhat better. She has taken no Tylenol today, she has attempted no other treatments today. Patient also reports she has some right lower quadrant abdominal pain but this is not as severe. Patient also reports urinary frequency for the past 3 weeks. The patient's last cycle was 05/06/2014 and was normal. Patient is sexually active with one partner and does not use any form of birth control. The patient's previous abdominal surgeries include a laparoscopic appendectomy. The patient denies fevers, chills, nausea, vomiting, constipation, diarrhea, dysuria, urgency of urination, hematuria, hematochezia, chest pain, shortness of breath, cough, rashes, vaginal discharge, vaginal bleeding, or fatigue.  (Consider location/radiation/quality/duration/timing/severity/associated sxs/prior Treatment) HPI  Past Medical History  Diagnosis Date  . No pertinent past medical history   . Anemia   . Post partum depression     Advanced FOB age 23  . Abnormal Pap smear of cervix     unknown stage of abnormality  . Sickle cell trait     ? FH of [place] trait  . Hx of chlamydia infection    Past Surgical History  Procedure Laterality Date  . Dilation and curettage of uterus    . Wisdom tooth extracted x1 in 04/2011    . Laparoscopic appendectomy  06/14/2012    Procedure: APPENDECTOMY LAPAROSCOPIC;  Surgeon: Clovis Puhomas A. Cornett, MD;  Location: [facility] OR;  Service: General;  Laterality: N/A;    Family History  Problem Relation Age of Onset  . Other Neg Hx   . Diabetes Maternal Aunt   . Kidney disease Maternal Grandmother   . Heart disease Maternal Grandmother     chf  . Hypertension Mother    History  Substance Use Topics  . Smoking status: Former Smoker -- 0.50 packs/day for 3 years    Quit date: 08/21/2011  . Smokeless tobacco: Never Used  . Alcohol Use: No   OB History    Gravida Para Term Preterm AB TAB SAB Ectopic Multiple Living   4 2 2  1  1   2      Review of Systems  Constitutional: Negative for fever, chills, appetite change and fatigue.  HENT: Negative for congestion, ear pain, sore throat and trouble swallowing.   Eyes: Negative for pain and visual disturbance.  Respiratory: Negative for cough, shortness of breath and wheezing.   Cardiovascular: Negative for chest pain, palpitations and leg swelling.  Gastrointestinal: Positive for abdominal pain. Negative for nausea, vomiting, diarrhea, constipation, blood in stool and abdominal distention.  Genitourinary: Positive for frequency. Negative for dysuria, urgency, hematuria, decreased urine volume, vaginal bleeding, vaginal discharge, difficulty urinating, genital sores, vaginal pain, menstrual problem and pelvic pain.  Musculoskeletal: Negative for myalgias, back pain and neck pain.  Skin: Negative for rash and wound.  Neurological: Negative for dizziness, weakness, light-headedness, numbness and headaches.  All other systems reviewed and are negative.     Allergies  Review of patient's allergies indicates no known  allergies.  Home Medications   Prior to Admission medications   Medication Sig Start Date End Date Taking? Authorizing Provider  Multiple Vitamin (MULTIVITAMIN WITH MINERALS) TABS tablet Take 1 tablet by mouth daily.   Yes Historical Provider, MD  cephALEXin (KEFLEX) 500 MG capsule Take 1 capsule (500 mg total) by mouth 4 (four) times daily. 05/28/14   Einar GipWilliam Duncan Karine Garn, PA-C   diphenhydrAMINE (BENADRYL) 25 MG tablet Take 1 tablet (25 mg total) by mouth every 6 (six) hours. Patient not taking: Reported on 05/28/2014 05/23/14   Earle GellBenjamin W Cartner, PA-C  HYDROcodone-acetaminophen (NORCO/VICODIN) 5-325 MG per tablet Take 1 tablet by mouth every 4 (four) hours as needed for moderate pain or severe pain. 05/28/14   Einar GipWilliam Duncan Libi Corso, PA-C  naproxen (NAPROSYN) 500 MG tablet Take 1 tablet (500 mg total) by mouth 2 (two) times daily. Patient not taking: Reported on 05/28/2014 05/23/14   Earle GellBenjamin W Cartner, PA-C  phenazopyridine (PYRIDIUM) 200 MG tablet Take 1 tablet (200 mg total) by mouth 3 (three) times daily as needed for pain. Patient not taking: Reported on 05/23/2014 03/03/14   Reuben Likesavid C Keller, MD   BP 102/63 mmHg  Pulse 86  Temp(Src) 98.2 F (36.8 C) (Oral)  Resp 17  Ht 5\' 2"  (1.575 m)  Wt 129 lb (58.514 kg)  BMI 23.59 kg/m2  SpO2 100%  LMP 05/08/2014 Physical Exam  Constitutional: She appears well-developed and well-nourished. No distress.  HENT:  Head: Normocephalic and atraumatic.  Right Ear: External ear normal.  Left Ear: External ear normal.  Mouth/Throat: Oropharynx is clear and moist. No oropharyngeal exudate.  Eyes: Conjunctivae are normal. Pupils are equal, round, and reactive to light. Right eye exhibits no discharge. Left eye exhibits no discharge.  Neck: Neck supple.  Cardiovascular: Normal rate, regular rhythm, normal heart sounds and intact distal pulses.  Exam reveals no gallop and no friction rub.   No murmur heard. Pulmonary/Chest: Effort normal and breath sounds normal. No respiratory distress. She has no wheezes. She has no rales.  Abdominal: Soft. Bowel sounds are normal. She exhibits no distension and no mass. There is tenderness. There is no rebound and no guarding.  Abdomen is soft. Bowel sounds are present. Mild left lateral abdomen is tender to palpation. No other area is tender to palpation. No right lower quadrant tenderness to  palpation. Negative psoas and obturator sign. No CVA tenderness.   Musculoskeletal: She exhibits no edema.  Lymphadenopathy:    She has no cervical adenopathy.  Neurological: She is alert. Coordination normal.  Skin: Skin is warm and dry. No rash noted. She is not diaphoretic. No erythema. No pallor.  Psychiatric: She has a normal mood and affect. Her behavior is normal.  Nursing note and vitals reviewed.   ED Course  Procedures (including critical care time) Labs Review Labs Reviewed  URINALYSIS, ROUTINE W REFLEX MICROSCOPIC - Abnormal; Notable for the following:    APPearance TURBID (*)    Hgb urine dipstick LARGE (*)    Protein, ur 100 (*)    Nitrite POSITIVE (*)    Leukocytes, UA LARGE (*)    All other components within normal limits  CBC WITH DIFFERENTIAL - Abnormal; Notable for the following:    RBC 3.53 (*)    Hemoglobin 11.7 (*)    HCT 35.4 (*)    MCV 100.3 (*)    RDW 11.4 (*)    All other components within normal limits  URINE MICROSCOPIC-ADD ON - Abnormal; Notable for the following:  Squamous Epithelial / LPF FEW (*)    Bacteria, UA MANY (*)    All other components within normal limits  PREGNANCY, URINE  COMPREHENSIVE METABOLIC PANEL  LIPASE, BLOOD    Imaging Review No results found.   EKG Interpretation None      Filed Vitals:   05/28/14 1300 05/28/14 1400 05/28/14 1425 05/28/14 1430  BP: 95/65 105/67 105/67 102/63  Pulse: 87 90 87 86  Temp:      TempSrc:      Resp:   17   Height:      Weight:      SpO2: 100% 100% 100% 100%     MDM   Meds given in ED:  Medications  ondansetron (ZOFRAN) injection 4 mg (4 mg Intravenous Not Given 05/28/14 1432)  morphine 4 MG/ML injection 4 mg (4 mg Intravenous Not Given 05/28/14 1431)    Discharge Medication List as of 05/28/2014  2:41 PM      Final diagnoses:  UTI (lower urinary tract infection)   Patient presents to the ED complaining of 3 weeks of left upper quadrant abdominal pain that is worse  since yesterday. She also reports 3 weeks of some urinary frequency. Patient is afebrile and nontoxic-appearing. Patient has some mild left lateral abdominal pain. His abdomen is soft and bowel sounds are present. Patient has no right lower quadrant tenderness. Patient has no CVA tenderness. Patient's native psoas and obturator sign. The patient is not having any nausea, vomiting or diarrhea. The patient has a negative urine pregnancy test. Patient's urinalysis returned with nitrite positive, large leukocytes as well as too numerous to count white blood cells. The patient's CBC and CMP are unremarkable. There is no need for imaging at this time. Will treat this patient for a UTI with Keflex and have her follow up with her PCP. Patient provided with Norco for break through pain. The patient refused morphine in the ED due to needing to drive home today. Advised patient she needed to follow-up with her primary care provider next week. Provided the patient a resource list if she needed a new primary care provider. Advised patient to return to emergency department with new or worsening symptoms or new concerns. Patient verbalized understanding and agreement with plan.   This patient was discussed with and evaluated by Dr. Park Pope who agrees with assessment and plan.     Lawana Chambers, PA-C 05/28/14 1627  Toy Cookey, MD 05/31/14 [phone]

## 2014-05-28 NOTE — ED Notes (Signed)
Pt driving home, unable to take narcotic at this time.

## 2014-05-28 NOTE — Discharge Instructions (Signed)
Urinary Tract Infection °Urinary tract infections (UTIs) can develop anywhere along your urinary tract. Your urinary tract is your body's drainage system for removing wastes and extra water. Your urinary tract includes two kidneys, two ureters, a bladder, and a urethra. Your kidneys are a pair of bean-shaped organs. Each kidney is about the size of your fist. They are located below your ribs, one on each side of your spine. °CAUSES °Infections are caused by microbes, which are microscopic organisms, including fungi, viruses, and bacteria. These organisms are so small that they can only be seen through a microscope. Bacteria are the microbes that most commonly cause UTIs. °SYMPTOMS  °Symptoms of UTIs may vary by age and gender of the patient and by the location of the infection. Symptoms in young women typically include a frequent and intense urge to urinate and a painful, burning feeling in the bladder or urethra during urination. Older women and men are more likely to be tired, shaky, and weak and have muscle aches and abdominal pain. A fever may mean the infection is in your kidneys. Other symptoms of a kidney infection include pain in your back or sides below the ribs, nausea, and vomiting. °DIAGNOSIS °To diagnose a UTI, your caregiver will ask you about your symptoms. Your caregiver also will ask to provide a urine sample. The urine sample will be tested for bacteria and white blood cells. White blood cells are made by your body to help fight infection. °TREATMENT  °Typically, UTIs can be treated with medication. Because most UTIs are caused by a bacterial infection, they usually can be treated with the use of antibiotics. The choice of antibiotic and length of treatment depend on your symptoms and the type of bacteria causing your infection. °HOME CARE INSTRUCTIONS °· If you were prescribed antibiotics, take them exactly as your caregiver instructs you. Finish the medication even if you feel better after you  have only taken some of the medication. °· Drink enough water and fluids to keep your urine clear or pale yellow. °· Avoid caffeine, tea, and carbonated beverages. They tend to irritate your bladder. °· Empty your bladder often. Avoid holding urine for long periods of time. °· Empty your bladder before and after sexual intercourse. °· After a bowel movement, women should cleanse from front to back. Use each tissue only once. °SEEK MEDICAL CARE IF:  °· You have back pain. °· You develop a fever. °· Your symptoms do not begin to resolve within 3 days. °SEEK IMMEDIATE MEDICAL CARE IF:  °· You have severe back pain or lower abdominal pain. °· You develop chills. °· You have nausea or vomiting. °· You have continued burning or discomfort with urination. °MAKE SURE YOU:  °· Understand these instructions. °· Will watch your condition. °· Will get help right away if you are not doing well or get worse. °Document Released: 02/28/2005 Document Revised: 11/20/2011 Document Reviewed: 06/29/2011 °ExitCare® Patient Information ©2015 [employer]. This information is not intended to replace advice given to you by your health care provider. Make sure you discuss any questions you have with your health care provider. ° ° °Emergency Department Resource Guide °1) Find a Doctor and Pay Out of Pocket °Although you won't have to find out who is covered by your insurance plan, it is a good idea to ask around and get recommendations. You will then need to call the office and see if the doctor you have chosen will accept you as a new patient and what types of   options they offer for patients who are self-pay. Some doctors offer discounts or will set up payment plans for their patients who do not have insurance, but you will need to ask so you aren't surprised when you get to your appointment. ° °2) Contact Your Local Health Department °Not all health departments have doctors that can see patients for sick visits, but many do, so it is worth  a call to see if yours does. If you don't know where your local health department is, you can check in your phone book. The CDC also has a tool to help you locate your state's health department, and many state websites also have listings of all of their local health departments. ° °3) Find a Walk-in Clinic °If your illness is not likely to be very severe or complicated, you may want to try a walk in clinic. These are popping up all over the country in pharmacies, drugstores, and shopping centers. They're usually staffed by nurse practitioners or physician assistants that have been trained to treat common illnesses and complaints. They're usually fairly quick and inexpensive. However, if you have serious medical issues or chronic medical problems, these are probably not your best option. ° °No Primary Care Doctor: °- Call [facility] at  832-8000 - they can help you locate a primary care doctor that  accepts your insurance, provides certain services, etc. °- Physician Referral Service- 1-800-533-3463 ° °Chronic Pain Problems: °Organization         Address  Phone   Notes  °[facility]  (336) 297-2271 Patients need to be referred by their primary care doctor.  ° °Medication Assistance: °Organization         Address  Phone   Notes  °[place] [facility] [address] °[place], [place] [address] (336) 641-8030 --Must be a resident of [place] °-- Must have NO insurance coverage whatsoever (no Medicaid/ Medicare, etc.) °-- The pt. MUST have a primary care doctor that directs their care regularly and follows them in the community °  °[facility]  (866) 331-1348   °[employer]  (888) 892-1162   ° °Agencies that provide inexpensive medical care: °Organization         Address  Phone   Notes  °[facility]  (336) 832-8035   °[facility]    (336) 832-7272   °[facility] [address] °[place], [place]  [address] (336) 832-4777   °[facility] [address], °[place] (336) 271-4999   °[facility]    (336) 373-0678   °[facility]    (336) 272-1050   °[facility] ° [address], [place] Phone:  (336) 832-4444, Fax:  (336) 832-4440 Hours of Operation:  9 am - 6 pm, M-F.  Also accepts Medicaid/Medicare and self-pay.  °[facility] ° [address], [place] Phone: (336) 832-3150, Fax: (336) 832-3151. Hours of Operation:  8:30 am - 5:30 pm, M-F.  Also accepts Medicaid and self-pay.  °[facility] [place] [address], [place] Phone: (336) 878-6027   °[facility] [address], [place], [place] (336)723-1848, Ext. 123 Mondays & Thursdays: 7-9 AM.  First 15 patients are seen on a first come, first serve basis. °  ° °Medicaid-accepting [place] Providers: ° °Organization         Address  Phone   Notes  °  [facility] [address], [place] (336) 641-2100 Also accepts self-pay patients.  °[facility] [address], [place] ° (336) 856-9996   °[facility] [address], [place] (336) 288-8857   °[facility] [address] [place] [address], [place] (336) 299-7000   °Veita Bland [address], [place]  ° (336) 373-1557 Only accepts [place] Access Medicaid patients after they have their name applied to their card.  ° °Self-Pay (no insurance) in [place]: ° °Organization         Address  Phone   Notes  °Sickle Cell Patients, [facility] [address], [place] (336) 832-1970   °[facility] [address], [place] (336) 832-4400   °[facility] ° [address], [place] (336) 992-4800   °[facility]/Dr. Osei-Bonsu ° [address] [place] [address], [place] or [address], [place] (336) 841-8500 Phone number for both [place] and [place] locations is the same.  °[facility] [address], [place] (336) 299-0000   °[facility] [address] [place] [address], [place] or [address] (336) 852-7530 °(336) 878-2260   °[facility] [address], [place] (336) 350-1642, phone; (336) 294-5005, fax Sees patients 1st and 3rd Saturday of every month.  Must not qualify for public or private insurance (i.e. Medicaid, Medicare, [place] Health Choice, Veterans' Benefits) • Household income should be no more than 200% of the poverty level •The clinic cannot treat you if you are pregnant or think you are pregnant • Sexually transmitted diseases are not treated at the clinic.  ° ° °Dental Care: °Organization         Address  Phone  Notes  °[facility] [address], [place] (336) 641-6152 Accepts children up to age 21 who are enrolled in Medicaid or [place] Health Choice; pregnant women with a Medicaid card; and children who have applied for Medicaid or [place] Health Choice, but were declined, whose parents can pay a reduced fee at time of service.  °[facility] [place]  [address], [place] (336) 641-7733 Accepts children up to age 21 who are enrolled in Medicaid or [place] Health Choice; pregnant women with a Medicaid card; and children who have applied for Medicaid or [place] Health Choice, but were declined, whose parents can pay a reduced fee at time of service.  °[facility] ° [address],  (336) 641-4533 Patients are seen by appointment only. Walk-ins are not accepted. [facility] will see patients 18 years of age and older. °Monday - Tuesday (8am-5pm) °Most Wednesdays (8:30-5pm) °$30 per visit, cash only  °[facility] ° [address], [place] (336) 641-4533  Patients are seen by appointment only. Walk-ins are not accepted. [facility] will see patients 18 years of age and older. °One Wednesday Evening (Monthly: Volunteer Based).  $30 per visit, cash only  °[facility]  (919) 537-3737 for adults; Children under age 4, call Graduate Pediatric Dentistry at (919) [phone]. Children aged 4-14, please call (919) 537-3737 to request a pediatric application. ° Dental services are provided in all areas of dental care including fillings, crowns and bridges, complete and partial   dentures, implants, gum treatment, root canals, and extractions. Preventive care is also provided. Treatment is provided to both adults and children. °Patients are selected via a lottery and there is often a waiting list. °  °[facility] [address], °[place] ° (336) 763-8833 [email or web address] °  °[facility] [address], [place], [place] (336)723-1848, Ext. 123 Second and Fourth Thursday of each month, opens at 6:30 AM; Clinic ends at 9 AM.  Patients are seen on a first-come first-served basis, and a limited number are seen during each clinic.  ° °[facility] ° [address], [place], [place] (336) 723-7904   Eligibility Requirements °You must have lived in [place], [place], or [place] counties for at least the last three months. °  You cannot be eligible for state or federal sponsored healthcare insurance, including Veterans Administration, Medicaid, or Medicare. °  You generally cannot be eligible for healthcare insurance through your employer.  °  How to apply: °Eligibility screenings are held every Tuesday and Wednesday afternoon from 1:00 pm until 4:00 pm. You do not need an appointment for the interview!  °[facility] [address], [place], [place] 336-631-2330   °[facility]  336-342-8273   °[facility]  336-703-3100   °[facility]   336-570-6415   ° °Behavioral Health Resources in the Community: °Intensive Outpatient Programs °Organization         Address  Phone  Notes  °[facility] [address], [place], [place] 336-878-6098   °[facility] [address], [place], [place] 336-832-9800   °[facility] [address], [place], [place] ° 336-882-2125   °[facility] [address],  °[place], [place] 1-800-853-5163 or 336-641-4981   °Substance Abuse Resources °Organization         Address  Phone  Notes  °[facility]  336-882-2125   °[facility]  336-784-9470   °[facility]  336-285-9073   °[facility]  336-845-3988   °Residential & Outpatient Substance Abuse Program  1-800-659-3381   °Psychological Services °Organization         Address  Phone  Notes  °[facility]  336- 832-9600   °[facility]  336- 378-7881   °[facility] [address], [place] 1-800-853-5163 or 336-641-4981   ° °Mobile Crisis Teams °Organization         Address  Phone  Notes  °[facility], Mobile Crisis Care Unit  1-877-626-1772   °Assertive °[facility] ° [address] [place], [place] 336-834-9664   °Sharon DeEsch [address] °[place] 336-554-5454   ° °Self-Help/Support Groups °Organization         Address  Phone             Notes  °[facility] - variety of support groups  336- 373-1402 Call for more information  °Narcotics Anonymous (NA), [facility] [address], °[place]  2 meetings at this location  ° °Residential Treatment Programs °Organization         Address  Phone  Notes  °[facility] [address],    °[place]  1-866-801-8205   °[facility] ° [address], [place], [place] 704-293-8524   °[facility] [address], [place] 336-845-3988 Admissions: 8am-3pm M-F   °  [facility] [address]    °[place], [place] 336-841-1104   °[facility] [address], [place], [place] 336-379-7146   °[facility] [address]  °[place], [place] 336-285-9073   °[facility] - Intensive Outpatient [address], [place], [place] 336-852-3033   °[facility] [address]  °[place], [place] 1-877-615-2722 or 336-784-9470   °[facility] [address] [place], [place] 336-227-7417 Accepts Medicaid  °[facility] [address]  °[place] 1-800-659-3381 Substance Abuse/Addiction Treatment  ° °[place] Behavioral Health Resources °Organization         Address  Phone  Notes  °[facility]  (888) 581-9988   °Julie Brannon, PhD [address] [place], [place]   (336) 349-5553 or (336) 951-0000   °[facility]   [address] °[place], [place] (336) 349-4454   °[facility] [address], [place], [place] (336) 342-8316 Insurance/Medicaid/sponsorship through [facility]  °[facility] [address]                                    [place], [place] (336) 342-8316 Therapy/tele-psych/case  °[facility] [address]  ° [place], [place] (336) 349-2233    °Dr. Arfeen  (336) 349-4544   °[facility] [place]  [employer] [place] [facility] 1) [address], [place] °2) [address], [place] °3)  [address], [place] (336) 349-3220 °(336) 342-7768 ° °(336) 342-8140   °[place] [facility] (336) 342-1394 or (336) 342-3537 (After Hours)    ° ° ° ° °

## 2014-09-28 ENCOUNTER — Encounter (HOSPITAL_COMMUNITY): Payer: Self-pay | Admitting: Physical Medicine and Rehabilitation

## 2014-09-28 ENCOUNTER — Emergency Department (HOSPITAL_COMMUNITY)
Admission: EM | Admit: 2014-09-28 | Discharge: 2014-09-28 | Disposition: A | Discharge status: Home / Self Care | Payer: Medicaid Other | Attending: Emergency Medicine | Admitting: Emergency Medicine

## 2014-09-28 ENCOUNTER — Emergency Department (HOSPITAL_COMMUNITY): Admission: EM | Admit: 2014-09-28 | Discharge: 2014-09-28 | Payer: Self-pay | Source: Home / Self Care

## 2014-09-28 DIAGNOSIS — Z79899 Other long term (current) drug therapy: Secondary | ICD-10-CM | POA: Insufficient documentation

## 2014-09-28 DIAGNOSIS — S6991XA Unspecified injury of right wrist, hand and finger(s), initial encounter: Secondary | ICD-10-CM | POA: Insufficient documentation

## 2014-09-28 DIAGNOSIS — Y9389 Activity, other specified: Secondary | ICD-10-CM | POA: Insufficient documentation

## 2014-09-28 DIAGNOSIS — R531 Weakness: Secondary | ICD-10-CM | POA: Insufficient documentation

## 2014-09-28 DIAGNOSIS — Y998 Other external cause status: Secondary | ICD-10-CM | POA: Insufficient documentation

## 2014-09-28 DIAGNOSIS — Z8619 Personal history of other infectious and parasitic diseases: Secondary | ICD-10-CM | POA: Insufficient documentation

## 2014-09-28 DIAGNOSIS — X503XXA Overexertion from repetitive movements, initial encounter: Secondary | ICD-10-CM

## 2014-09-28 DIAGNOSIS — Z8659 Personal history of other mental and behavioral disorders: Secondary | ICD-10-CM | POA: Insufficient documentation

## 2014-09-28 DIAGNOSIS — X58XXXA Exposure to other specified factors, initial encounter: Secondary | ICD-10-CM | POA: Insufficient documentation

## 2014-09-28 DIAGNOSIS — M79641 Pain in right hand: Secondary | ICD-10-CM

## 2014-09-28 DIAGNOSIS — T148XXA Other injury of unspecified body region, initial encounter: Secondary | ICD-10-CM

## 2014-09-28 DIAGNOSIS — S59911A Unspecified injury of right forearm, initial encounter: Secondary | ICD-10-CM | POA: Insufficient documentation

## 2014-09-28 DIAGNOSIS — S6992XA Unspecified injury of left wrist, hand and finger(s), initial encounter: Secondary | ICD-10-CM | POA: Insufficient documentation

## 2014-09-28 DIAGNOSIS — Y9289 Other specified places as the place of occurrence of the external cause: Secondary | ICD-10-CM | POA: Insufficient documentation

## 2014-09-28 DIAGNOSIS — Z87891 Personal history of nicotine dependence: Secondary | ICD-10-CM | POA: Insufficient documentation

## 2014-09-28 DIAGNOSIS — Z862 Personal history of diseases of the blood and blood-forming organs and certain disorders involving the immune mechanism: Secondary | ICD-10-CM | POA: Insufficient documentation

## 2014-09-28 DIAGNOSIS — Z792 Long term (current) use of antibiotics: Secondary | ICD-10-CM | POA: Insufficient documentation

## 2014-09-28 DIAGNOSIS — M79642 Pain in left hand: Secondary | ICD-10-CM

## 2014-09-28 MED ORDER — NAPROXEN 500 MG PO TABS: 500.0000 mg | ORAL_TABLET | Freq: Two times a day (BID) | ORAL | Status: DC

## 2014-09-28 NOTE — ED Provider Notes (Signed)
CSN: [id number]     Arrival date & time 09/28/14  1422 History  This chart was scribed for Rhea BleacherJosh Zia Kanner, PA-C working with Vanetta MuldersScott Zackowski, MD by Elveria Risingimelie Horne, ED Scribe. This patient was seen in room TR06C/TR06C and the patient's care was started at 2:52 PM.   Chief Complaint  Patient presents with  . Hand Pain    The history is provided by the patient. No language interpreter was used.   HPI Comments: Jennifer Peters is a 24 y.o. female who presents to the Emergency Department complaining of bilateral hand tightness with greater sensation felt in right, onset this morning at work. 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 Patient reports hand tightness and describes the sensation as wearing gloves when attempting to manipulate her hands. Patient reports weakness in her right wrist and locates a mass/node to posterior right wrist that is not present on her left wrist. Patient denies numbness, tingling or pins and needle pain in her fingers.   Patient denies direct injury.    Past Medical History  Diagnosis Date  . No pertinent past medical history   . Anemia   . Post partum depression     Advanced FOB age 24  . Abnormal Pap smear of cervix     unknown stage of abnormality  . Sickle cell trait     ? FH of [place] trait  . Hx of chlamydia infection    Past Surgical History  Procedure Laterality Date  . Dilation and curettage of uterus    . Wisdom tooth extracted x1 in 04/2011    . Laparoscopic appendectomy  06/14/2012    Procedure: APPENDECTOMY LAPAROSCOPIC;  Surgeon: Clovis Puhomas A. Cornett, MD;  Location: [facility] OR;  Service: General;  Laterality: N/A;   Family History  Problem Relation Age of Onset  . Other Neg Hx   . Diabetes Maternal Aunt   . Kidney disease Maternal Grandmother   . Heart disease Maternal Grandmother     chf  . Hypertension Mother    History  Substance Use Topics  . Smoking status: Former Smoker -- 0.50 packs/day for 3  years    Quit date: 08/21/2011  . Smokeless tobacco: Never Used  . Alcohol Use: No   OB History    Gravida Para Term Preterm AB TAB SAB Ectopic Multiple Living   4 2 2  1  1   2      Review of Systems  Constitutional: Negative for fever and activity change.  Musculoskeletal: Positive for myalgias. Negative for back pain, joint swelling, arthralgias and neck pain.  Skin: Negative for color change and wound.  Neurological: Positive for weakness. Negative for numbness.      Allergies  Review of patient's allergies indicates no known allergies.  Home Medications   Prior to Admission medications   Medication Sig Start Date End Date Taking? Authorizing Provider  cephALEXin (KEFLEX) 500 MG capsule Take 1 capsule (500 mg total) by mouth 4 (four) times daily. 05/28/14   Everlene FarrierWilliam Dansie, PA-C  diphenhydrAMINE (BENADRYL) 25 MG tablet Take 1 tablet (25 mg total) by mouth every 6 (six) hours. Patient not taking: Reported on 05/28/2014 05/23/14   Joycie PeekBenjamin Cartner, PA-C  HYDROcodone-acetaminophen (NORCO/VICODIN) 5-325 MG per tablet Take 1 tablet by mouth every 4 (four) hours as needed for moderate pain or severe pain. 05/28/14   Everlene FarrierWilliam Dansie, PA-C  Multiple Vitamin (MULTIVITAMIN WITH MINERALS) TABS tablet Take 1 tablet by mouth daily.  Historical Provider, MD  naproxen (NAPROSYN) 500 MG tablet Take 1 tablet (500 mg total) by mouth 2 (two) times daily. Patient not taking: Reported on 05/28/2014 05/23/14   Joycie Peek, PA-C  phenazopyridine (PYRIDIUM) 200 MG tablet Take 1 tablet (200 mg total) by mouth 3 (three) times daily as needed for pain. Patient not taking: Reported on 05/23/2014 03/03/14   Reuben Likes, MD   Triage Vitals: BP 101/69 mmHg  Pulse 94  Temp(Src) 98.6 F (37 C) (Oral)  Resp 18  SpO2 99% Physical Exam  Constitutional: She is oriented to person, place, and time. She appears well-developed and well-nourished. No distress.  HENT:  Head: Normocephalic and atraumatic.   Eyes: EOM are normal. Pupils are equal, round, and reactive to light.  Neck: Normal range of motion. Neck supple. No tracheal deviation present.  Cardiovascular: Normal rate.  Exam reveals no decreased pulses.   Pulses:      Radial pulses are 2+ on the right side, and 2+ on the left side.  Pulmonary/Chest: Effort normal. No respiratory distress.  Musculoskeletal: Normal range of motion. She exhibits no edema or tenderness.  Patient with 5/5 strength wrists and hands bilaterally. Mild localized area of soft tissue swelling, R distal forearm, extensor surface. Not painful or red.   Neurological: She is alert and oriented to person, place, and time. No sensory deficit.  Motor, sensation, and vascular distal to the injury is fully intact.   Skin: Skin is warm and dry.  Psychiatric: She has a normal mood and affect. Her behavior is normal.  Nursing note and vitals reviewed.   ED Course  Procedures (including critical care time)  COORDINATION OF CARE: 2:58 PM- Patient advised to treat with rest and anti inflammatories. Discussed treatment plan with patient at bedside and patient agreed to plan.   Labs Review Labs Reviewed - No data to display  Imaging Review No results found.   EKG Interpretation None       Vital signs reviewed and are as follows: Filed Vitals:   09/28/14 1435  BP: 101/69  Pulse: 94  Temp: 98.6 F (37 C)  Resp: 18   Plan: RICE, NSAIDs.   MDM   Final diagnoses:  Bilateral hand pain  Overuse injury   Patient with bilateral hand sx 2/2 overuse. Exam is largely unremarkable. Doubt carpal tunnel given history, duration of symptoms. Upper extremities are neurovascularly intact.   I personally performed the services described in this documentation, which was scribed in my presence. The recorded information has been reviewed and is accurate.    Renne Crigler, PA-C 09/28/14 1526  Vanetta Mulders, MD 09/30/14 [phone]

## 2014-09-28 NOTE — ED Notes (Signed)
Pt presents to department for evaluation of bilateral hand pain, reports fingers feel swollen. Denies recent injury. No obvious deformities noted. Pt is alert and oriented x4. NAD.

## 2014-09-28 NOTE — Discharge Instructions (Signed)
Please read and follow all provided instructions.  Your diagnoses today include:  1. Bilateral hand pain   2. Overuse injury    Tests performed today include:  Vital signs. See below for your results today.   Medications prescribed:   Naproxen - anti-inflammatory pain medication  Do not exceed 500mg  naproxen every 12 hours, take with food  You have been prescribed an anti-inflammatory medication or NSAID. Take with food. Take smallest effective dose for the shortest duration needed for your pain. Stop taking if you experience stomach pain or vomiting.   Take any prescribed medications only as directed.  Home care instructions:   Follow any educational materials contained in this packet  Follow R.I.C.E. Protocol:  R - rest your injury   I  - use ice on injury without applying directly to skin  C - compress injury with bandage or splint  E - elevate the injury as much as possible  Follow-up instructions: Please follow-up with your primary care provider if you continue to have significant pain in 1 week. In this case you may have a more severe injury that requires further care.   Return instructions:   Please return if your fingers are numb or tingling, appear gray or blue, or you have severe pain (also elevate the arm and loosen splint or wrap if you were given one)  Please return to the Emergency Department if you experience worsening symptoms.   Please return if you have any other emergent concerns.  Additional Information:  Your vital signs today were: BP 101/69 mmHg   Pulse 94   Temp(Src) 98.6 F (37 C) (Oral)   Resp 18   SpO2 99% If your blood pressure (BP) was elevated above 135/85 this visit, please have this repeated by your doctor within one month. --------------

## 2015-02-18 ENCOUNTER — Emergency Department (HOSPITAL_COMMUNITY): Payer: Medicaid Other

## 2015-02-18 ENCOUNTER — Encounter (HOSPITAL_COMMUNITY): Payer: Self-pay | Admitting: Emergency Medicine

## 2015-02-18 ENCOUNTER — Emergency Department (HOSPITAL_COMMUNITY)
Admission: EM | Admit: 2015-02-18 | Discharge: 2015-02-18 | Discharge status: Against Medical Advice | Payer: Medicaid Other | Attending: Emergency Medicine | Admitting: Emergency Medicine

## 2015-02-18 DIAGNOSIS — Z87891 Personal history of nicotine dependence: Secondary | ICD-10-CM | POA: Insufficient documentation

## 2015-02-18 DIAGNOSIS — R05 Cough: Secondary | ICD-10-CM | POA: Insufficient documentation

## 2015-02-18 DIAGNOSIS — R059 Cough, unspecified: Secondary | ICD-10-CM

## 2015-02-18 DIAGNOSIS — J029 Acute pharyngitis, unspecified: Secondary | ICD-10-CM | POA: Insufficient documentation

## 2015-02-18 DIAGNOSIS — Z91040 Latex allergy status: Secondary | ICD-10-CM | POA: Insufficient documentation

## 2015-02-18 DIAGNOSIS — Z8619 Personal history of other infectious and parasitic diseases: Secondary | ICD-10-CM | POA: Insufficient documentation

## 2015-02-18 DIAGNOSIS — R0981 Nasal congestion: Secondary | ICD-10-CM | POA: Insufficient documentation

## 2015-02-18 DIAGNOSIS — R5383 Other fatigue: Secondary | ICD-10-CM

## 2015-02-18 DIAGNOSIS — Z862 Personal history of diseases of the blood and blood-forming organs and certain disorders involving the immune mechanism: Secondary | ICD-10-CM | POA: Insufficient documentation

## 2015-02-18 NOTE — ED Notes (Addendum)
Pt c/o productive cough with greenish color secretions. Pt also reports had a sore throat but feels better now Pt also reports decrease appetite and fluid intake.

## 2015-02-18 NOTE — ED Notes (Signed)
Patient refused to allow me to draw blood, stated "she had to be somewhere and could not wait"; MD notified and spoke with patient; patient left against medical advice.

## 2015-02-18 NOTE — ED Provider Notes (Signed)
CSN: [id number]     Arrival date & time 02/18/15  4098 History   First MD Initiated Contact with Patient 02/18/15 1000     Chief Complaint  Patient presents with  . Cough  . Fatigue  . Sore Throat   HPI  Jennifer Peters is a 24 year old female presenting with congestion, cough and fatigue x 6 days. She states that she has been coughing "day and night" with green-yellow phlegm. Denies blood tinged sputum. She also reports congestion with increased "whitish" nasal secretions when she blows her nose. Associated symptoms include fatigue and decreased appetite. She had sore throat, fevers, chills and headaches that were present at the beginning of her illness but have since resolved. Denies difficulty swallowing. Pt did not take her temperature at home. Pt states "I feel like I'm getting better but why is it lasting so long, something must be wrong". Denies dizziness, weakness, syncope, chest pain, SOB, wheezing, abdominal pain, nausea, vomiting, diarrhea or muscle aches.   Past Medical History  Diagnosis Date  . No pertinent past medical history   . Anemia   . Post partum depression     Advanced FOB age 1  . Abnormal Pap smear of cervix     unknown stage of abnormality  . Sickle cell trait     ? FH of [place] trait  . Hx of chlamydia infection    Past Surgical History  Procedure Laterality Date  . Dilation and curettage of uterus    . Wisdom tooth extracted x1 in 04/2011    . Laparoscopic appendectomy  06/14/2012    Procedure: APPENDECTOMY LAPAROSCOPIC;  Surgeon: Clovis Pu. Cornett, MD;  Location: [facility] OR;  Service: General;  Laterality: N/A;   Family History  Problem Relation Age of Onset  . Other Neg Hx   . Diabetes Maternal Aunt   . Kidney disease Maternal Grandmother   . Heart disease Maternal Grandmother     chf  . Hypertension Mother    Social History  Substance Use Topics  . Smoking status: Former Smoker -- 0.50 packs/day for 3 years    Quit date: 08/21/2011  . Smokeless tobacco:  Never Used  . Alcohol Use: No   OB History    Gravida Para Term Preterm AB TAB SAB Ectopic Multiple Living   Review of Systems  Constitutional: Positive for fever, chills, appetite change and fatigue. Negative for diaphoresis.  HENT: Positive for congestion, sneezing and sore throat. Negative for ear pain, sinus pressure and trouble swallowing.   Respiratory: Positive for cough. Negative for shortness of breath and wheezing.   Cardiovascular: Negative for chest pain.  Gastrointestinal: Negative for nausea, vomiting, abdominal pain and diarrhea.  Musculoskeletal: Negative for myalgias, back pain, arthralgias and neck pain.  Skin: Negative for rash.  Neurological: Positive for headaches. Negative for dizziness, syncope and weakness.      Allergies  Latex  Home Medications   Prior to Admission medications   Medication Sig Start Date End Date Taking? Authorizing Provider  ibuprofen (ADVIL,MOTRIN) 200 MG tablet Take 200 mg by mouth every 6 (six) hours as needed for mild pain.   Yes Historical Provider, MD  cephALEXin (KEFLEX) 500 MG capsule Take 1 capsule (500 mg total) by mouth 4 (four) times daily. Patient not taking: Reported on 02/18/2015 05/28/14   Everlene Farrier, PA-C  HYDROcodone-acetaminophen (NORCO/VICODIN) 5-325 MG per tablet Take 1 tablet by mouth every 4 (  four) hours as needed for moderate pain or severe pain. Patient not taking: Reported on 02/18/2015 05/28/14   Everlene Farrier, PA-C  Multiple Vitamin (MULTIVITAMIN WITH MINERALS) TABS tablet Take 1 tablet by mouth daily.    Historical Provider, MD  naproxen (NAPROSYN) 500 MG tablet Take 1 tablet (500 mg total) by mouth 2 (two) times daily. Patient not taking: Reported on 02/18/2015 09/28/14   Renne Crigler, PA-C   BP 99/73 mmHg  Pulse 66  Temp(Src) 98.3 F (36.8 C) (Oral)  Resp 16  Ht 5\' 2"  (1.575 m)  Wt 126 lb (57.153 kg)  BMI 23.04 kg/m2  SpO2 100%  LMP 02/11/2015 Physical Exam  Constitutional:  She appears well-developed and well-nourished. No distress.  HENT:  Head: Normocephalic and atraumatic.  Mouth/Throat: Oropharynx is clear and moist. No oropharyngeal exudate.  Eyes: Conjunctivae are normal. Right eye exhibits no discharge. Left eye exhibits no discharge. No scleral icterus.  Neck: Normal range of motion.  Cardiovascular: Normal rate, regular rhythm and normal heart sounds.   Pulmonary/Chest: Effort normal and breath sounds normal. No respiratory distress. She has no wheezes. She has no rales. She exhibits no tenderness.  Abdominal: Soft. There is no tenderness. There is no rebound and no guarding.  Musculoskeletal: Normal range of motion.  Neurological: She is alert. Coordination normal.  Skin: Skin is warm and dry.  Psychiatric: She has a normal mood and affect. Her behavior is normal.  Nursing note and vitals reviewed.   ED Course  Procedures (including critical care time) Labs Review Labs Reviewed  CBC WITH DIFFERENTIAL/PLATELET  BASIC METABOLIC PANEL    Imaging Review Dg Chest 2 View  02/18/2015   CLINICAL DATA:  Cough and shortness of breath 1 week.  EXAM: CHEST  2 VIEW  COMPARISON:  None.  FINDINGS: The heart size and mediastinal contours are within normal limits. Both lungs are clear. The visualized skeletal structures are unremarkable.  IMPRESSION: No active cardiopulmonary disease.   Electronically Signed   By: Elberta Fortis M.D.   On: 02/18/2015 11:42   I have personally reviewed and evaluated these images and lab results as part of my medical decision-making.   EKG Interpretation None      MDM   Final diagnoses:  None   Pt presenting with cough, congestion and fatigue. Pt refusing to stay for blood draw because "I have somewhere to be". I have discussed my concerns as her provider and the possibility that this may worsen. I have specifically discussed that without further evaluation I cannot guarantee there is not a life threatening event occuring.   Pt is A&Ox4, her own POA and states understanding of my concerns and the possible consequences.  I have made pt aware that this is an AMA discharge, but she may return at any time for further evaluation and treatment.     Alveta Heimlich, PA-C 02/18/15 1229  Laurence Spates, MD 02/18/15 [phone]

## 2016-05-02 ENCOUNTER — Encounter: Payer: Self-pay | Admitting: Certified Nurse Midwife

## 2016-05-02 ENCOUNTER — Other Ambulatory Visit (HOSPITAL_COMMUNITY)
Admission: RE | Admit: 2016-05-02 | Discharge: 2016-05-02 | Disposition: A | Discharge status: Home / Self Care | Payer: 59 | Source: Ambulatory Visit | Attending: Obstetrics | Admitting: Obstetrics

## 2016-05-02 ENCOUNTER — Ambulatory Visit (INDEPENDENT_AMBULATORY_CARE_PROVIDER_SITE_OTHER): Payer: Medicaid Other | Admitting: Certified Nurse Midwife

## 2016-05-02 VITALS — BP 111/71 | HR 93

## 2016-05-02 DIAGNOSIS — Z3687 Encounter for antenatal screening for uncertain dates: Secondary | ICD-10-CM

## 2016-05-02 DIAGNOSIS — Z3481 Encounter for supervision of other normal pregnancy, first trimester: Secondary | ICD-10-CM | POA: Diagnosis not present

## 2016-05-02 DIAGNOSIS — Z3480 Encounter for supervision of other normal pregnancy, unspecified trimester: Secondary | ICD-10-CM

## 2016-05-02 DIAGNOSIS — Z01419 Encounter for gynecological examination (general) (routine) without abnormal findings: Secondary | ICD-10-CM | POA: Diagnosis not present

## 2016-05-02 DIAGNOSIS — O0993 Supervision of high risk pregnancy, unspecified, third trimester: Secondary | ICD-10-CM | POA: Insufficient documentation

## 2016-05-02 LAB — POCT URINALYSIS DIPSTICK
BILIRUBIN UA: NEGATIVE
Blood, UA: NEGATIVE
GLUCOSE UA: NEGATIVE
KETONES UA: NEGATIVE
Leukocytes, UA: NEGATIVE
NITRITE UA: NEGATIVE
PH UA: 6
Protein, UA: NEGATIVE
Spec Grav, UA: 1.015
Urobilinogen, UA: 1

## 2016-05-02 MED ORDER — VITAFOL GUMMIES 3.33-0.333-34.8 MG PO CHEW
3.0000 | CHEWABLE_TABLET | Freq: Every day | ORAL | 12 refills | Status: DC
Start: 2016-05-02 — End: 2016-09-11

## 2016-05-02 NOTE — Addendum Note (Signed)
Addended by: Marya LandryFOSTER, Che Rachal D on: 05/02/2016 04:26 PM   Modules accepted: Orders

## 2016-05-02 NOTE — Addendum Note (Signed)
Addended by: Marya LandryFOSTER, Camile Esters D on: 05/02/2016 04:08 PM   Modules accepted: Orders

## 2016-05-02 NOTE — Progress Notes (Signed)
Subjective:    Jennifer Peters is being seen today for her first obstetrical visit.  This is not a planned pregnancy. She is at [redacted]w[redacted]d gestation. Her obstetrical history is significant for none. Relationship with FOB: significant other, not living together. Patient does intend to breast feed. Pregnancy history fully reviewed.  The information documented in the HPI was reviewed and verified.  Menstrual History: OB History    Gravida Para Term Preterm AB Living   4 2 2  0 1 2   SAB TAB Ectopic Multiple Live Births   1 0 0 0 2       Patient's last menstrual period was 02/29/2016 (approximate).    Past Medical History:  Diagnosis Date  . Abnormal Pap smear of cervix    unknown stage of abnormality  . Anemia   . Hx of chlamydia infection   . No pertinent past medical history   . Post partum depression    Advanced FOB age 25    Past Surgical History:  Procedure Laterality Date  . DILATION AND CURETTAGE OF UTERUS    . LAPAROSCOPIC APPENDECTOMY  06/14/2012   Procedure: APPENDECTOMY LAPAROSCOPIC;  Surgeon: Clovis Pu. Cornett, MD;  Location: [facility] OR;  Service: General;  Laterality: N/A;  . wisdom tooth extracted x1 in 04/2011       (Not in a hospital admission) Allergies  Allergen Reactions  . Latex Itching and Rash    Social History  Substance Use Topics  . Smoking status: Former Smoker    Packs/day: 0.50    Years: 3.00    Quit date: 08/21/2011  . Smokeless tobacco: Never Used  . Alcohol use No    Family History  Problem Relation Age of Onset  . Kidney disease Maternal Grandmother   . Heart disease Maternal Grandmother     chf  . Diabetes Maternal Aunt   . Hypertension Mother   . Other Neg Hx      Review of Systems Constitutional: negative for weight loss Gastrointestinal: negative for vomiting Genitourinary:negative for genital lesions and vaginal discharge and dysuria Musculoskeletal:negative for back pain Behavioral/Psych: negative for abusive relationship, depression,  illegal drug usage and tobacco use    Objective:    BP 111/71   Pulse 93   LMP 02/29/2016 (Approximate)  General Appearance:    Alert, cooperative, no distress, appears stated age  Head:    Normocephalic, without obvious abnormality, atraumatic  Eyes:    PERRL, conjunctiva/corneas clear, EOM's intact, fundi    benign, both eyes  Ears:    Normal TM's and external ear canals, both ears  Nose:   Nares normal, septum midline, mucosa normal, no drainage    or sinus tenderness  Throat:   Lips, mucosa, and tongue normal; teeth and gums normal  Neck:   Supple, symmetrical, trachea midline, no adenopathy;    thyroid:  no enlargement/tenderness/nodules; no carotid   bruit or JVD  Back:     Symmetric, no curvature, ROM normal, no CVA tenderness  Lungs:     Clear to auscultation bilaterally, respirations unlabored  Chest Wall:    No tenderness or deformity   Heart:    Regular rate and rhythm, S1 and S2 normal, no murmur, rub   or gallop  Breast Exam:    No tenderness, masses, or nipple abnormality  Abdomen:     Soft, non-tender, bowel sounds active all four quadrants,    no masses, no organomegaly  Genitalia:    Normal female without lesion, discharge or tenderness  Extremities:   Extremities normal, atraumatic, no cyanosis or edema  Pulses:   2+ and symmetric all extremities  Skin:   Skin color, texture, turgor normal, no rashes or lesions  Lymph nodes:   Cervical, supraclavicular, and axillary nodes normal  Neurologic:   CNII-XII intact, normal strength, sensation and reflexes    throughout       Cervix:   Long, thick, closed and posterior.  FHR: 170 by doppler    FH: less than U.     Lab Review Urine pregnancy test Labs reviewed yes Radiologic studies reviewed no Assessment:    Pregnancy at 1576w0d weeks    Plan:      Prenatal vitamins.  Counseling provided regarding continued use of seat belts, cessation of alcohol consumption, smoking or use of illicit drugs; infection precautions  i.e., influenza/TDAP immunizations, toxoplasmosis,CMV, parvovirus, listeria and varicella; workplace safety, exercise during pregnancy; routine dental care, safe medications, sexual activity, hot tubs, saunas, pools, travel, caffeine use, fish and methlymercury, potential toxins, hair treatments, varicose veins Weight gain recommendations per IOM guidelines reviewed: underweight/BMI< 18.5--> gain 28 - 40 lbs; normal weight/BMI 18.5 - 24.9--> gain 25 - 35 lbs; overweight/BMI 25 - 29.9--> gain 15 - 25 lbs; obese/BMI >30->gain  11 - 20 lbs Problem list reviewed and updated. FIRST/CF mutation testing/NIPT/QUAD SCREEN/fragile X/Ashkenazi Jewish population testing/Spinal muscular atrophy discussed: ordered. Role of ultrasound in pregnancy discussed; fetal survey: requested. Amniocentesis discussed: not indicated. VBAC calculator score: VBAC consent form provided Meds ordered this encounter  Medications  . Prenatal Vit-Fe Phos-FA-Omega (VITAFOL GUMMIES) 3.33-0.333-34.8 MG CHEW    Sig: Chew 3 tablets by mouth at bedtime.    Dispense:  90 tablet    Refill:  12   Orders Placed This Encounter  Procedures  . Culture, OB Urine  . US OB Comp Less 14 Wks    Standing Status:   Future    Standing Expiration Date:   07/02/2017    Order Specific Question:   Reason for Exam (SYMPTOM  OR DIAGNOSIS REQUIRED)    Answer:   dating    Order Specific Question:   Preferred imaging location?    Answer:   Internal  . Hemoglobinopathy evaluation  . Varicella zoster antibody, IgG  . VITAMIN D 25 Hydroxy (Vit-D Deficiency, Fractures)  . Obstetric Panel, Including HIV  . Hemoglobin A1c  . Cystic Fibrosis Mutation 97  . MaterniT21 PLUS Core+SCA    Order Specific Question:   Is the patient insulin dependent?    Answer:   No    Order Specific Question:   Please enter gestational age. This should be expressed as weeks AND days, i.e. 16w 6d. Enter weeks here. Enter days in next question.    Answer:   79    Order Specific  Question:   Please enter gestational age. This should be expressed as weeks AND days, i.e. 16w 6d. Enter days here. Enter weeks in previous question.    Answer:   0    Order Specific Question:   How was gestational age calculated?    Answer:   LMP    Order Specific Question:   Please give the date of LMP OR Ultrasound OR Estimated date of delivery.    Answer:   12/05/2016    Order Specific Question:   Number of Fetuses (Type of Pregnancy):    Answer:   1    Order Specific Question:   Indications for performing the test? (please choose all that apply):  Answer:   Routine screening    Order Specific Question:   Other Indications? (Y=Yes, N=No)    Answer:   N    Order Specific Question:   If this is a repeat specimen, please indicate the reason:    Answer:   Not indicated    Order Specific Question:   Please specify the patient's race: (C=White/Caucasion, B=Black, I=Native American, A=Asian, H=Hispanic, O=Other, U=Unknown)    Answer:   B    Order Specific Question:   Donor Egg - indicate if the egg was obtained from in vitro fertilization.    Answer:   N    Order Specific Question:   Age of Egg Donor.    Answer:   [age]    Order Specific Question:   Prior Down Syndrome/ONTD screening during current pregnancy.    Answer:   N    Order Specific Question:   Prior First Trimester Testing    Answer:   N    Order Specific Question:   Prior Second Trimester Testing    Answer:   N    Order Specific Question:   Family History of Neural Tube Defects    Answer:   N    Order Specific Question:   Prior Pregnancy with Down Syndrome    Answer:   N    Order Specific Question:   Please give the patient's weight (in pounds)    Answer:   137  . POCT urinalysis dipstick    Follow up in 4 weeks. 50% of 30 min visit spent on counseling and coordination of care.

## 2016-05-04 LAB — CYTOLOGY - PAP: Diagnosis: NEGATIVE

## 2016-05-05 LAB — NUSWAB VG+, CANDIDA 6SP
Atopobium vaginae: HIGH Score — AB
CANDIDA ALBICANS, NAA: NEGATIVE
CANDIDA LUSITANIAE, NAA: NEGATIVE
CANDIDA PARAPSILOSIS, NAA: NEGATIVE
CANDIDA TROPICALIS, NAA: NEGATIVE
CHLAMYDIA TRACHOMATIS, NAA: NEGATIVE
Candida glabrata, NAA: NEGATIVE
Candida krusei, NAA: NEGATIVE
Megasphaera 1: HIGH Score — AB
Neisseria gonorrhoeae, NAA: NEGATIVE
TRICH VAG BY NAA: NEGATIVE

## 2016-05-05 LAB — CULTURE, OB URINE

## 2016-05-05 LAB — URINE CULTURE, OB REFLEX

## 2016-05-08 ENCOUNTER — Other Ambulatory Visit: Payer: Self-pay | Admitting: Certified Nurse Midwife

## 2016-05-08 DIAGNOSIS — N760 Acute vaginitis: Principal | ICD-10-CM

## 2016-05-08 DIAGNOSIS — B9689 Other specified bacterial agents as the cause of diseases classified elsewhere: Secondary | ICD-10-CM

## 2016-05-08 LAB — TOXASSURE SELECT 13 (MW), URINE

## 2016-05-08 MED ORDER — METRONIDAZOLE 0.75 % VA GEL: 1.0000 | Freq: Two times a day (BID) | VAGINAL | 0 refills | Status: DC

## 2016-05-09 ENCOUNTER — Other Ambulatory Visit: Payer: Self-pay | Admitting: Certified Nurse Midwife

## 2016-05-09 DIAGNOSIS — Z3480 Encounter for supervision of other normal pregnancy, unspecified trimester: Secondary | ICD-10-CM

## 2016-05-09 LAB — MATERNIT21 PLUS CORE+SCA
CHROMOSOME 13: NEGATIVE
CHROMOSOME 18: NEGATIVE
CHROMOSOME 21: NEGATIVE
Y CHROMOSOME: DETECTED

## 2016-05-10 ENCOUNTER — Other Ambulatory Visit: Payer: Self-pay | Admitting: Certified Nurse Midwife

## 2016-05-10 ENCOUNTER — Telehealth: Payer: Self-pay

## 2016-05-10 DIAGNOSIS — R7989 Other specified abnormal findings of blood chemistry: Secondary | ICD-10-CM

## 2016-05-10 HISTORY — DX: Other specified abnormal findings of blood chemistry: R79.89

## 2016-05-10 LAB — OBSTETRIC PANEL, INCLUDING HIV
ANTIBODY SCREEN: NEGATIVE
BASOS ABS: 0 10*3/uL (ref 0.0–0.2)
BASOS: 0 %
EOS (ABSOLUTE): 0.2 10*3/uL (ref 0.0–0.4)
Eos: 3 %
HEMATOCRIT: 29.8 % — AB (ref 34.0–46.6)
HIV SCREEN 4TH GENERATION: NONREACTIVE
Hemoglobin: 10.3 g/dL — ABNORMAL LOW (ref 11.1–15.9)
Hepatitis B Surface Ag: NEGATIVE
Immature Grans (Abs): 0 10*3/uL (ref 0.0–0.1)
Immature Granulocytes: 1 %
LYMPHS ABS: 2.5 10*3/uL (ref 0.7–3.1)
Lymphs: 30 %
MCH: 34.8 pg — ABNORMAL HIGH (ref 26.6–33.0)
MCHC: 34.6 g/dL (ref 31.5–35.7)
MCV: 101 fL — AB (ref 79–97)
MONOS ABS: 1 10*3/uL — AB (ref 0.1–0.9)
Monocytes: 12 %
NEUTROS ABS: 4.6 10*3/uL (ref 1.4–7.0)
Neutrophils: 54 %
PLATELETS: 326 10*3/uL (ref 150–379)
RBC: 2.96 x10E6/uL — AB (ref 3.77–5.28)
RDW: 12.4 % (ref 12.3–15.4)
RPR Ser Ql: NONREACTIVE
Rh Factor: POSITIVE
Rubella Antibodies, IGG: 2.87 index (ref >0.99)
WBC: 8.4 10*3/uL (ref 3.4–10.8)

## 2016-05-10 LAB — CYSTIC FIBROSIS MUTATION 97: Interpretation: NOT DETECTED

## 2016-05-10 LAB — HEMOGLOBINOPATHY EVALUATION
HEMOGLOBIN A2 QUANTITATION: 2.4 % (ref 0.7–3.1)
HEMOGLOBIN F QUANTITATION: 0 % (ref 0.0–2.0)
HGB C: 0 %
HGB S: 0 %
Hgb A: 97.6 % (ref 94.0–98.0)

## 2016-05-10 LAB — VITAMIN D 25 HYDROXY (VIT D DEFICIENCY, FRACTURES): VIT D 25 HYDROXY: 14.1 ng/mL — AB (ref 30.0–100.0)

## 2016-05-10 LAB — VARICELLA ZOSTER ANTIBODY, IGG: Varicella zoster IgG: 1345 index (ref >165)

## 2016-05-10 LAB — HEMOGLOBIN A1C
ESTIMATED AVERAGE GLUCOSE: 82 mg/dL
HEMOGLOBIN A1C: 4.5 % — AB (ref 4.8–5.6)

## 2016-05-10 MED ORDER — VITAMIN D (ERGOCALCIFEROL) 1.25 MG (50000 UNIT) PO CAPS: 50000.0000 [IU] | ORAL_CAPSULE | ORAL | 2 refills | Status: DC

## 2016-05-10 NOTE — Telephone Encounter (Signed)
Contacted pt and advised of results and rx sent by provider. 

## 2016-05-15 ENCOUNTER — Ambulatory Visit (INDEPENDENT_AMBULATORY_CARE_PROVIDER_SITE_OTHER): Payer: Medicaid Other

## 2016-05-15 DIAGNOSIS — Z3687 Encounter for antenatal screening for uncertain dates: Secondary | ICD-10-CM | POA: Diagnosis not present

## 2016-05-18 ENCOUNTER — Other Ambulatory Visit: Payer: Self-pay | Admitting: Certified Nurse Midwife

## 2016-05-18 DIAGNOSIS — Z3480 Encounter for supervision of other normal pregnancy, unspecified trimester: Secondary | ICD-10-CM

## 2016-05-24 ENCOUNTER — Inpatient Hospital Stay (HOSPITAL_COMMUNITY)
Admission: AD | Admit: 2016-05-24 | Discharge: 2016-05-24 | Disposition: A | Discharge status: Home / Self Care | Payer: Medicaid Other | Source: Ambulatory Visit | Attending: Obstetrics & Gynecology | Admitting: Obstetrics & Gynecology

## 2016-05-24 ENCOUNTER — Encounter (HOSPITAL_COMMUNITY): Payer: Self-pay

## 2016-05-24 DIAGNOSIS — O26891 Other specified pregnancy related conditions, first trimester: Secondary | ICD-10-CM | POA: Diagnosis not present

## 2016-05-24 DIAGNOSIS — Z87891 Personal history of nicotine dependence: Secondary | ICD-10-CM | POA: Insufficient documentation

## 2016-05-24 DIAGNOSIS — R109 Unspecified abdominal pain: Secondary | ICD-10-CM | POA: Diagnosis not present

## 2016-05-24 DIAGNOSIS — N949 Unspecified condition associated with female genital organs and menstrual cycle: Secondary | ICD-10-CM

## 2016-05-24 DIAGNOSIS — Z79899 Other long term (current) drug therapy: Secondary | ICD-10-CM | POA: Insufficient documentation

## 2016-05-24 DIAGNOSIS — Z9889 Other specified postprocedural states: Secondary | ICD-10-CM | POA: Diagnosis not present

## 2016-05-24 DIAGNOSIS — Z3480 Encounter for supervision of other normal pregnancy, unspecified trimester: Secondary | ICD-10-CM

## 2016-05-24 DIAGNOSIS — Z3A13 13 weeks gestation of pregnancy: Secondary | ICD-10-CM | POA: Diagnosis not present

## 2016-05-24 DIAGNOSIS — M62838 Other muscle spasm: Secondary | ICD-10-CM | POA: Diagnosis not present

## 2016-05-24 LAB — URINALYSIS, ROUTINE W REFLEX MICROSCOPIC
Bacteria, UA: NONE SEEN
Bilirubin Urine: NEGATIVE
GLUCOSE, UA: NEGATIVE mg/dL
Hgb urine dipstick: NEGATIVE
KETONES UR: NEGATIVE mg/dL
Nitrite: NEGATIVE
PH: 7 (ref 5.0–8.0)
Protein, ur: NEGATIVE mg/dL
Specific Gravity, Urine: 1.018 (ref 1.005–1.030)

## 2016-05-24 MED ORDER — CYCLOBENZAPRINE HCL 5 MG PO TABS
5.0000 mg | ORAL_TABLET | Freq: Once | ORAL | Status: AC
Start: 2016-05-24 — End: 2016-05-24
  Administered 2016-05-24: 5 mg via ORAL
  Filled 2016-05-24: qty 1

## 2016-05-24 MED ORDER — CYCLOBENZAPRINE HCL 5 MG PO TABS: 5.0000 mg | ORAL_TABLET | Freq: Three times a day (TID) | ORAL | 0 refills | Status: DC | PRN

## 2016-05-24 NOTE — MAU Note (Signed)
Pt reports having abd cramping off and on since 8am. Denies any vag bleeding or discharge.

## 2016-05-24 NOTE — MAU Provider Note (Signed)
History     CSN: [id number]  Arrival date and time: 05/24/16 1125   First Provider Initiated Contact with Patient 05/24/16 1213      Chief Complaint  Patient presents with  . Abdominal Cramping   Patient is a 25 year old G4 P2 at 13 weeks and 0 days who presents today for lower abdominal cramping that started in her back and is now radiating to her front. She denies any urinary symptoms. She did take Tylenol at home which does not seem to improve her pain. She reports a normal bowel movement last night. She did try eating to see if that would relieve her pain but it did not either. She denies fevers or chills. She states she's never had pain like this before in her previous pregnancies.    OB History    Gravida Para Term Preterm AB Living   4 2 2  0 1 2   SAB TAB Ectopic Multiple Live Births   1 0 0 0 2      Past Medical History:  Diagnosis Date  . Abnormal Pap smear of cervix    unknown stage of abnormality  . Anemia   . Hx of chlamydia infection   . No pertinent past medical history   . Post partum depression    Advanced FOB age 25    Past Surgical History:  Procedure Laterality Date  . DILATION AND CURETTAGE OF UTERUS    . LAPAROSCOPIC APPENDECTOMY  06/14/2012   Procedure: APPENDECTOMY LAPAROSCOPIC;  Surgeon: Clovis Puhomas A. Cornett, MD;  Location: [facility] OR;  Service: General;  Laterality: N/A;  . wisdom tooth extracted x1 in 04/2011      Family History  Problem Relation Age of Onset  . Kidney disease Maternal Grandmother   . Heart disease Maternal Grandmother     chf  . Diabetes Maternal Aunt   . Hypertension Mother   . Other Neg Hx     Social History  Substance Use Topics  . Smoking status: Former Smoker    Packs/day: 0.50    Years: 3.00    Quit date: 08/21/2011  . Smokeless tobacco: Never Used  . Alcohol use No    Allergies:  Allergies  Allergen Reactions  . Latex Itching and Rash    Prescriptions Prior to Admission  Medication Sig Dispense Refill  Last Dose  . Prenatal Vit-Fe Phos-FA-Omega (VITAFOL GUMMIES) 3.33-0.333-34.8 MG CHEW Chew 3 tablets by mouth at bedtime. 90 tablet 12 05/23/2016 at Unknown time  . metroNIDAZOLE (METROGEL VAGINAL) 0.75 % vaginal gel Place 1 Applicatorful vaginally 2 (two) times daily. (Patient not taking: Reported on 05/24/2016) 70 g 0 Completed Course at Unknown time  . Vitamin D, Ergocalciferol, (DRISDOL) 50000 units CAPS capsule Take 1 capsule (50,000 Units total) by mouth every 7 (seven) days. (Patient not taking: Reported on 05/24/2016) 30 capsule 2 05/13/2016 at Unknown time    Review of Systems  Constitutional: Negative for chills and fever.  Respiratory: Negative for cough and shortness of breath.   Cardiovascular: Negative for chest pain and palpitations.  Gastrointestinal: Negative for abdominal pain, diarrhea, heartburn, nausea and vomiting.  Genitourinary: Negative for dysuria, frequency and urgency.  Neurological: Negative for dizziness and headaches.   Physical Exam   Blood pressure 109/67, pulse 93, temperature 98.3 F (36.8 C), temperature source Oral, resp. rate 18, height 5\' 1"  (1.549 m), weight 139 lb (63 kg), last menstrual period 02/29/2016.  Physical Exam  Constitutional: She is oriented to person, place, and time. She appears well-developed  and well-nourished.  Cardiovascular: Normal rate and intact distal pulses.   Respiratory: Effort normal. No respiratory distress.  GI: Soft. Bowel sounds are normal. She exhibits no distension. There is tenderness. There is no rebound and no guarding.  Minimal tenderness bilateral lower quadrants  Musculoskeletal:  No CVA tenderness, mild paraspinal muscle spasm and tenderness to palpation over the lumbar region.  Neurological: She is alert and oriented to person, place, and time.  Skin: Skin is warm and dry.  Psychiatric: She has a normal mood and affect. Her behavior is normal.    MAU Course  Procedures  MDM In MA U patient underwent  evaluation for urinary tract infection with urinalysis which was negative.  Fetal heart tones were obtained and found to be within normal limits.  Patient was given a Flexeril to see if it helped with low back pain.  Assessment and Plan  Round ligament pain: Advised abdominal support band and Tylenol when necessary. Advised cramping could be signs of early miscarriage with no vaginal bleeding and good heart tones nothing to follow-up at this time. Advised return to MAU if cramping worsens or she starts bleeding  Lumbar muscle spasm: Advised heating pack and prescription for Flexeril sent to pharmacy.  Ernestina Pennaicholas Schenk 05/24/2016, 12:40 PM

## 2016-05-24 NOTE — Discharge Instructions (Signed)
Round Ligament Pain Introduction The round ligament is a cord of muscle and tissue that helps to support the uterus. It can become a source of pain during pregnancy if it becomes stretched or twisted as the baby grows. The pain usually begins in the second trimester of pregnancy, and it can come and go until the baby is delivered. It is not a serious problem, and it does not cause harm to the baby. Round ligament pain is usually a short, sharp, and pinching pain, but it can also be a dull, lingering, and aching pain. The pain is felt in the lower side of the abdomen or in the groin. It usually starts deep in the groin and moves up to the outside of the hip area. Pain can occur with:  A sudden change in position.  Rolling over in bed.  Coughing or sneezing.  Physical activity. Follow these instructions at home: Watch your condition for any changes. Take these steps to help with your pain:  When the pain starts, relax. Then try:  Sitting down.  Flexing your knees up to your abdomen.  Lying on your side with one pillow under your abdomen and another pillow between your legs.  Sitting in a warm bath for 15-20 minutes or until the pain goes away.  Take over-the-counter and prescription medicines only as told by your health care provider.  Move slowly when you sit and stand.  Avoid long walks if they cause pain.  Stop or lessen your physical activities if they cause pain. Contact a health care provider if:  Your pain does not go away with treatment.  You feel pain in your back that you did not have before.  Your medicine is not helping. Get help right away if:  You develop a fever or chills.  You develop uterine contractions.  You develop vaginal bleeding.  You develop nausea or vomiting.  You develop diarrhea.  You have pain when you urinate. This information is not intended to replace advice given to you by your health care provider. Make sure you discuss any questions  you have with your health care provider. Document Released: 02/28/2008 Document Revised: 10/27/2015 Document Reviewed: 07/28/2014  2017 Elsevier  

## 2016-05-30 ENCOUNTER — Ambulatory Visit (INDEPENDENT_AMBULATORY_CARE_PROVIDER_SITE_OTHER): Payer: Medicaid Other | Admitting: Certified Nurse Midwife

## 2016-05-30 DIAGNOSIS — Z3492 Encounter for supervision of normal pregnancy, unspecified, second trimester: Secondary | ICD-10-CM

## 2016-05-30 DIAGNOSIS — Z3490 Encounter for supervision of normal pregnancy, unspecified, unspecified trimester: Secondary | ICD-10-CM

## 2016-05-30 MED ORDER — OB COMPLETE PETITE 35-5-1-200 MG PO CAPS: 1.0000 | ORAL_CAPSULE | Freq: Every day | ORAL | 12 refills | Status: DC

## 2016-05-30 NOTE — Progress Notes (Signed)
Pt would like to change PNV, states she has no appetite with Gummies. [facility].

## 2016-05-30 NOTE — Progress Notes (Signed)
  Subjective:    Jennifer SettingMaria Mciver is a 25 y.o. female being seen today for her obstetrical visit. She is at 1521w6d gestation. Patient reports: no bleeding, no contractions, no cramping, no leaking and lack of appetite.  Problem List Items Addressed This Visit      Other   Encounter for supervision of normal pregnancy, antepartum   Relevant Medications   Prenat-FeCbn-FeAspGl-FA-Omega (OB COMPLETE PETITE) 35-5-1-200 MG CAPS   Other Relevant Orders   US MFM OB COMP + 14 WK     Patient Active Problem List   Diagnosis Date Noted  . Low vitamin D level 05/10/2016  . Encounter for supervision of normal pregnancy, antepartum 05/02/2016  . Anemia   . Post partum depression   . Abnormal Pap smear of cervix     Objective:     BP 102/73   Pulse 100   Wt 138 lb (62.6 kg)   LMP 02/29/2016 (Approximate)   BMI 26.07 kg/m  Uterine Size: Below umbilicus   FHR: 160 by doppler  Assessment:    Pregnancy @ 3521w6d  weeks Doing well    Plan:    Problem list reviewed and updated. Labs reviewed.  Follow up in 4 weeks. FIRST/CF mutation testing/NIPT/QUAD SCREEN/fragile X/Ashkenazi Jewish population testing/Spinal muscular atrophy discussed: results reviewed. Role of ultrasound in pregnancy discussed; fetal survey: ordered. Amniocentesis discussed: not indicated. 50% of 15 minute visit spent on counseling and coordination of care.

## 2016-06-15 ENCOUNTER — Encounter (HOSPITAL_COMMUNITY): Payer: Self-pay

## 2016-06-15 ENCOUNTER — Inpatient Hospital Stay (HOSPITAL_COMMUNITY)
Admission: AD | Admit: 2016-06-15 | Discharge: 2016-06-15 | Disposition: A | Discharge status: Home / Self Care | Payer: Medicaid Other | Source: Ambulatory Visit | Attending: Obstetrics and Gynecology | Admitting: Obstetrics and Gynecology

## 2016-06-15 DIAGNOSIS — Z91040 Latex allergy status: Secondary | ICD-10-CM | POA: Insufficient documentation

## 2016-06-15 DIAGNOSIS — Z8249 Family history of ischemic heart disease and other diseases of the circulatory system: Secondary | ICD-10-CM | POA: Insufficient documentation

## 2016-06-15 DIAGNOSIS — J029 Acute pharyngitis, unspecified: Secondary | ICD-10-CM | POA: Diagnosis present

## 2016-06-15 DIAGNOSIS — Z3A16 16 weeks gestation of pregnancy: Secondary | ICD-10-CM | POA: Diagnosis not present

## 2016-06-15 DIAGNOSIS — J069 Acute upper respiratory infection, unspecified: Secondary | ICD-10-CM

## 2016-06-15 DIAGNOSIS — Z87891 Personal history of nicotine dependence: Secondary | ICD-10-CM | POA: Diagnosis not present

## 2016-06-15 DIAGNOSIS — Z3A17 17 weeks gestation of pregnancy: Secondary | ICD-10-CM | POA: Diagnosis not present

## 2016-06-15 DIAGNOSIS — Z3490 Encounter for supervision of normal pregnancy, unspecified, unspecified trimester: Secondary | ICD-10-CM

## 2016-06-15 DIAGNOSIS — Z8619 Personal history of other infectious and parasitic diseases: Secondary | ICD-10-CM | POA: Insufficient documentation

## 2016-06-15 DIAGNOSIS — O99512 Diseases of the respiratory system complicating pregnancy, second trimester: Secondary | ICD-10-CM | POA: Insufficient documentation

## 2016-06-15 LAB — RAPID STREP SCREEN (MED CTR MEBANE ONLY): Streptococcus, Group A Screen (Direct): NEGATIVE

## 2016-06-15 NOTE — Discharge Instructions (Signed)

## 2016-06-15 NOTE — MAU Note (Signed)
Started last Friday, runny, runny eyes, sore throat, sneezing, fever (didn't check it- none since Monday).  Son developed symptoms Wed, tested + today for type A.  Pt thought she just had a sinus infection, still has sore throat.

## 2016-06-15 NOTE — MAU Provider Note (Signed)
History     CSN: [id number]  Arrival date and time: 06/15/16 1019   First Provider Initiated Contact with Patient 06/15/16 1109      Chief Complaint  Patient presents with  . flu symptoms  . Sore Throat   HPI Ms. Jennifer Peters is a 26 y.o. [phone] at [redacted]w[redacted]d who presents to MAU today with complaint of sore throat, nasal congestion and non-productive cough x 1 week. The patient states that she through it was just a cold and then son developed symptoms and tested positive for influenza. She is concerned that she may have the flu as well. She states that she felt feverish earlier this week, but did not have a thermometer to take her temperature. She also denies fever today. She denies abdominal pain or vaginal bleeding.    OB History    Gravida Para Term Preterm AB Living   4 2 2  0 1 2   SAB TAB Ectopic Multiple Live Births   1 0 0 0 2      Past Medical History:  Diagnosis Date  . Abnormal Pap smear of cervix    unknown stage of abnormality  . Anemia   . Hx of chlamydia infection   . No pertinent past medical history   . Post partum depression    Advanced FOB age 39    Past Surgical History:  Procedure Laterality Date  . DILATION AND CURETTAGE OF UTERUS    . LAPAROSCOPIC APPENDECTOMY  06/14/2012   Procedure: APPENDECTOMY LAPAROSCOPIC;  Surgeon: Clovis Pu. Cornett, MD;  Location: [facility] OR;  Service: General;  Laterality: N/A;  . wisdom tooth extracted x1 in 04/2011      Family History  Problem Relation Age of Onset  . Kidney disease Maternal Grandmother   . Heart disease Maternal Grandmother     chf  . Diabetes Maternal Aunt   . Hypertension Mother   . Other Neg Hx     Social History  Substance Use Topics  . Smoking status: Former Smoker    Packs/day: 0.50    Years: 3.00    Quit date: 08/21/2011  . Smokeless tobacco: Never Used  . Alcohol use No    Allergies:  Allergies  Allergen Reactions  . Latex Itching and Rash    No prescriptions prior to admission.     Review of Systems  Constitutional: Negative for fever.  HENT: Positive for congestion, rhinorrhea, sneezing and sore throat. Negative for ear pain, sinus pain and sinus pressure.   Respiratory: Negative for shortness of breath and wheezing.   Cardiovascular: Negative for chest pain.  Gastrointestinal: Negative for abdominal pain.  Genitourinary: Negative for vaginal bleeding.  Neurological: Negative for headaches.   Physical Exam   Blood pressure 101/66, pulse 96, temperature 98.5 F (36.9 C), temperature source Oral, resp. rate 16, last menstrual period 02/29/2016, SpO2 100 %.  Physical Exam  Nursing note and vitals reviewed. Constitutional: She is oriented to person, place, and time. She appears well-developed and well-nourished. No distress.  HENT:  Head: Normocephalic and atraumatic.  Right Ear: Tympanic membrane, external ear and ear canal normal.  Left Ear: Tympanic membrane, external ear and ear canal normal.  Nose: Rhinorrhea present. No mucosal edema. Right sinus exhibits no maxillary sinus tenderness and no frontal sinus tenderness. Left sinus exhibits no maxillary sinus tenderness and no frontal sinus tenderness.  Mouth/Throat: Mucous membranes are normal. Posterior oropharyngeal erythema present. No oropharyngeal exudate, posterior oropharyngeal edema or tonsillar abscesses.  Cardiovascular: Normal rate.  Respiratory: Effort normal.  GI: Soft. She exhibits no distension and no mass. There is no tenderness. There is no rebound and no guarding.  Lymphadenopathy:       Head (right side): No submental, no submandibular and no tonsillar adenopathy present.       Head (left side): No submental, no submandibular and no tonsillar adenopathy present.    She has no cervical adenopathy.  Neurological: She is alert and oriented to person, place, and time.  Skin: Skin is warm and dry. No erythema.  Psychiatric: She has a normal mood and affect.   No results found for this or  any previous visit (from the past 24 hour(s)).  MAU Course  Procedures None  MDM FHR - 155 bpm with doppler  Rapid strep obtained. Will call patient with positive results only.  Influenza test not obtained since patient has been symptomatic > 72 hours, Tamiflu would not be given Assessment and Plan  A: Viral URI vs Resolving Influenza vs Strep throat  P:  Discharge home List of OTC medications safe in pregnancy given with suggestions for symptomatic relief Increased PO hydration and rest advised  Second trimester precautions discussed Patient advised to follow-up with [facility] as scheduled for routine prenatal care or sooner PRN Patient may return to MAU as needed or if her condition were to change or worsen  Marny LowensteinJulie N Tafari Humiston, PA-C  06/15/2016, 1:07 PM

## 2016-06-16 ENCOUNTER — Emergency Department (HOSPITAL_COMMUNITY): Payer: Medicaid Other

## 2016-06-16 ENCOUNTER — Encounter (HOSPITAL_COMMUNITY): Payer: Self-pay

## 2016-06-16 DIAGNOSIS — J111 Influenza due to unidentified influenza virus with other respiratory manifestations: Secondary | ICD-10-CM | POA: Diagnosis not present

## 2016-06-16 DIAGNOSIS — O99512 Diseases of the respiratory system complicating pregnancy, second trimester: Secondary | ICD-10-CM | POA: Diagnosis present

## 2016-06-16 DIAGNOSIS — Z79899 Other long term (current) drug therapy: Secondary | ICD-10-CM | POA: Diagnosis not present

## 2016-06-16 DIAGNOSIS — Z91040 Latex allergy status: Secondary | ICD-10-CM | POA: Diagnosis not present

## 2016-06-16 DIAGNOSIS — Z87891 Personal history of nicotine dependence: Secondary | ICD-10-CM | POA: Insufficient documentation

## 2016-06-16 DIAGNOSIS — R0781 Pleurodynia: Secondary | ICD-10-CM | POA: Diagnosis not present

## 2016-06-16 DIAGNOSIS — Z3A16 16 weeks gestation of pregnancy: Secondary | ICD-10-CM | POA: Diagnosis not present

## 2016-06-16 LAB — CBC
HEMATOCRIT: 32.7 % — AB (ref 36.0–46.0)
HEMOGLOBIN: 10.9 g/dL — AB (ref 12.0–15.0)
MCH: 34.2 pg — ABNORMAL HIGH (ref 26.0–34.0)
MCHC: 33.3 g/dL (ref 30.0–36.0)
MCV: 102.5 fL — AB (ref 78.0–100.0)
Platelets: 269 10*3/uL (ref 150–400)
RBC: 3.19 MIL/uL — ABNORMAL LOW (ref 3.87–5.11)
RDW: 12.2 % (ref 11.5–15.5)
WBC: 6.5 10*3/uL (ref 4.0–10.5)

## 2016-06-16 LAB — BASIC METABOLIC PANEL
ANION GAP: 9 (ref 5–15)
BUN: 5 mg/dL — AB (ref 6–20)
CHLORIDE: 103 mmol/L (ref 101–111)
CO2: 23 mmol/L (ref 22–32)
Calcium: 9.4 mg/dL (ref 8.9–10.3)
Creatinine, Ser: 0.63 mg/dL (ref 0.44–1.00)
GFR calc Af Amer: >60 mL/min (ref >60)
GLUCOSE: 108 mg/dL — AB (ref 65–99)
POTASSIUM: 3.2 mmol/L — AB (ref 3.5–5.1)
Sodium: 135 mmol/L (ref 135–145)

## 2016-06-16 LAB — I-STAT TROPONIN, ED: Troponin i, poc: 0 ng/mL (ref 0.00–0.08)

## 2016-06-16 LAB — LIPASE, BLOOD: LIPASE: 19 U/L (ref 11–51)

## 2016-06-16 NOTE — ED Notes (Signed)
Pt states that she had her throat swabbed yesterday for strep

## 2016-06-16 NOTE — ED Triage Notes (Signed)
Pt states that she started having CP today, cental pressure with upper abd pain, n/v, radiation to upper back. Pt is also [redacted] weeks pregnant.

## 2016-06-17 ENCOUNTER — Emergency Department (HOSPITAL_COMMUNITY)
Admission: EM | Admit: 2016-06-17 | Discharge: 2016-06-17 | Disposition: A | Discharge status: Home / Self Care | Payer: Medicaid Other | Attending: Emergency Medicine | Admitting: Emergency Medicine

## 2016-06-17 DIAGNOSIS — J111 Influenza due to unidentified influenza virus with other respiratory manifestations: Secondary | ICD-10-CM

## 2016-06-17 DIAGNOSIS — R69 Illness, unspecified: Secondary | ICD-10-CM

## 2016-06-17 LAB — CULTURE, GROUP A STREP (THRC)

## 2016-06-17 LAB — URINALYSIS, ROUTINE W REFLEX MICROSCOPIC
Bacteria, UA: NONE SEEN
Bilirubin Urine: NEGATIVE
GLUCOSE, UA: NEGATIVE mg/dL
Hgb urine dipstick: NEGATIVE
KETONES UR: 80 mg/dL — AB
Nitrite: NEGATIVE
PH: 5 (ref 5.0–8.0)
Protein, ur: 30 mg/dL — AB
SPECIFIC GRAVITY, URINE: 1.027 (ref 1.005–1.030)

## 2016-06-17 MED ORDER — OSELTAMIVIR PHOSPHATE 75 MG PO CAPS
75.0000 mg | ORAL_CAPSULE | Freq: Once | ORAL | Status: AC
  Administered 2016-06-17: 75 mg via ORAL
  Filled 2016-06-17: qty 1

## 2016-06-17 MED ORDER — POTASSIUM CHLORIDE CRYS ER 20 MEQ PO TBCR: 20.0000 meq | EXTENDED_RELEASE_TABLET | Freq: Every day | ORAL | 0 refills | Status: DC

## 2016-06-17 MED ORDER — OSELTAMIVIR PHOSPHATE 75 MG PO CAPS: 75.0000 mg | ORAL_CAPSULE | Freq: Two times a day (BID) | ORAL | 0 refills | Status: DC

## 2016-06-17 MED ORDER — POTASSIUM CHLORIDE CRYS ER 20 MEQ PO TBCR
40.0000 meq | EXTENDED_RELEASE_TABLET | Freq: Once | ORAL | Status: AC
  Administered 2016-06-17: 40 meq via ORAL
  Filled 2016-06-17: qty 2

## 2016-06-17 MED ORDER — MAGNESIUM OXIDE 400 (241.3 MG) MG PO TABS
400.0000 mg | ORAL_TABLET | Freq: Once | ORAL | Status: AC
  Administered 2016-06-17: 400 mg via ORAL
  Filled 2016-06-17: qty 1

## 2016-06-17 NOTE — ED Provider Notes (Signed)
[facility] DE[facility] Provider Note   CSN: [id number] Arrival date & time: 06/16/16  1955     History   Chief Complaint Chief Complaint  Patient presents with  . Chest Pain  . Sore Throat    HPI   Blood pressure 105/68, pulse 88, temperature 98.7 F (37.1 C), temperature source Oral, resp. rate 14, height 5\' 2"  (1.575 m), weight 65.3 kg, last menstrual period 02/29/2016, SpO2 100 %.  Jennifer Peters is a 26 y.o. female who is [redacted] weeks pregnant, uncomplicated pregnancy and [facility] complaining of sore throat, rhinorrhea, productive cough, headache, myalgia, pleuritic chest pain and upper abdominal pain worsening over the course of last 7-8 days, significantly worsening over the last 24-48 hours. Her 26-year-old has been diagnosed with influenza A. She is reporting less frequent than normal urination with no dysuria or hematuria. She denies any lower abdominal pain, vaginal bleeding, vaginal discharge. She states that she felt febrile at home but she states that her thermometer is broken. She also reports 4 episodes of nonbloody, nonbilious, non-coffee-ground emesis earlier in the day. She was tested for strep yesterday and that was negative. She took 2 extra strength Tylenol just before she came in today.  Past Medical History:  Diagnosis Date  . Abnormal Pap smear of cervix    unknown stage of abnormality  . Anemia   . Hx of chlamydia infection   . No pertinent past medical history   . Post partum depression    Advanced FOB age 26    Patient Active Problem List   Diagnosis Date Noted  . Low vitamin D level 05/10/2016  . Encounter for supervision of normal pregnancy, antepartum 05/02/2016  . Anemia   . Post partum depression   . Abnormal Pap smear of cervix     Past Surgical History:  Procedure Laterality Date  . DILATION AND CURETTAGE OF UTERUS    . LAPAROSCOPIC APPENDECTOMY  06/14/2012   Procedure: APPENDECTOMY LAPAROSCOPIC;  Surgeon: Clovis Puhomas A. Cornett,  MD;  Location: [facility] OR;  Service: General;  Laterality: N/A;  . wisdom tooth extracted x1 in 04/2011      OB History    Gravida Para Term Preterm AB Living   4 2 2  0 1 2   SAB TAB Ectopic Multiple Live Births   1 0 0 0 2       Home Medications    Prior to Admission medications   Medication Sig Start Date End Date Taking? Authorizing Provider  Prenatal Vit-Fe Phos-FA-Omega (VITAFOL GUMMIES) 3.33-0.333-34.8 MG CHEW Chew 3 tablets by mouth at bedtime. 05/02/16  Yes Rachelle A Denney, CNM  oseltamivir (TAMIFLU) 75 MG capsule Take 1 capsule (75 mg total) by mouth every 12 (twelve) hours. 06/17/16   Lanie Schelling, PA-C  potassium chloride SA (K-DUR,KLOR-CON) 20 MEQ tablet Take 1 tablet (20 mEq total) by mouth daily. 06/17/16   Joni ReiningNicole Melenda Bielak, PA-C    Family History Family History  Problem Relation Age of Onset  . Kidney disease Maternal Grandmother   . Heart disease Maternal Grandmother     chf  . Diabetes Maternal Aunt   . Hypertension Mother   . Other Neg Hx     Social History Social History  Substance Use Topics  . Smoking status: Former Smoker    Packs/day: 0.50    Years: 3.00    Quit date: 08/21/2011  . Smokeless tobacco: Never Used  . Alcohol use No     Allergies   Latex  Review of Systems Review of Systems  10 systems reviewed and found to be negative, except as noted in the HPI.  Physical Exam Updated Vital Signs BP 105/68 (BP Location: Left Arm)   Pulse 88   Temp 98.7 F (37.1 C) (Oral)   Resp 14   Ht 5\' 2"  (1.575 m)   Wt 65.3 kg   LMP 02/29/2016 (Approximate)   SpO2 100%   BMI 26.34 kg/m   Physical Exam  Constitutional: She is oriented to person, place, and time. She appears well-developed and well-nourished. No distress.  HENT:  Head: Normocephalic.  Right Ear: External ear normal.  Left Ear: External ear normal.  Mouth/Throat: Oropharynx is clear and moist. No oropharyngeal exudate.  No drooling or stridor. Posterior pharynx mildly  erythematous no significant tonsillar hypertrophy. No exudate. Soft palate rises symmetrically. No TTP or induration under tongue.   No tenderness to palpation of frontal or bilateral maxillary sinuses.  Mild mucosal edema in the nares with scant rhinorrhea.  Bilateral tympanic membranes with normal architecture and good light reflex.    Eyes: Conjunctivae and EOM are normal. Pupils are equal, round, and reactive to light.  Neck: Normal range of motion. Neck supple. No JVD present. No tracheal deviation present.  Cardiovascular: Normal rate, regular rhythm and intact distal pulses.   Pulmonary/Chest: Effort normal and breath sounds normal. No stridor. No respiratory distress. She has no wheezes. She has no rales. She exhibits no tenderness.  Abdominal: Soft. Bowel sounds are normal. She exhibits no distension and no mass. There is no tenderness. There is no rebound and no guarding.  Genitourinary:  Genitourinary Comments: No CVA tenderness to percussion bilaterally  Musculoskeletal: Normal range of motion. She exhibits no edema or tenderness.     Neurological: She is alert and oriented to person, place, and time.  Skin: Skin is warm. She is not diaphoretic.  Psychiatric: She has a normal mood and affect.  Nursing note and vitals reviewed.    ED Treatments / Results  Labs (all labs ordered are listed, but only abnormal results are displayed) Labs Reviewed  BASIC METABOLIC PANEL - Abnormal; Notable for the following:       Result Value   Potassium 3.2 (*)    Glucose, Bld 108 (*)    BUN 5 (*)    All other components within normal limits  CBC - Abnormal; Notable for the following:    RBC 3.19 (*)    Hemoglobin 10.9 (*)    HCT 32.7 (*)    MCV 102.5 (*)    MCH 34.2 (*)    All other components within normal limits  URINALYSIS, ROUTINE W REFLEX MICROSCOPIC - Abnormal; Notable for the following:    Ketones, ur 80 (*)    Protein, ur 30 (*)    Leukocytes, UA TRACE (*)    Squamous  Epithelial / LPF 0-5 (*)    All other components within normal limits  URINE CULTURE  LIPASE, BLOOD  I-STAT TROPOININ, ED    EKG  EKG Interpretation None       Radiology Dg Chest 2 View  Result Date: 06/16/2016 CLINICAL DATA:  Acute onset of productive cough and sore throat. Midsternal chest pain and nausea. Fever. Initial encounter. EXAM: CHEST  2 VIEW COMPARISON:  Chest radiograph performed 02/18/2015 FINDINGS: The lungs are well-aerated and clear. There is no evidence of focal opacification, pleural effusion or pneumothorax. The heart is normal in size; the mediastinal contour is within normal limits. No acute osseous  abnormalities are seen. IMPRESSION: No acute cardiopulmonary process seen. Electronically Signed   By: Roanna Raider M.D.   On: 06/16/2016 21:23    Procedures Procedures (including critical care time)  Medications Ordered in ED Medications  oseltamivir (TAMIFLU) capsule 75 mg (75 mg Oral Given 06/17/16 0119)  magnesium oxide (MAG-OX) tablet 400 mg (400 mg Oral Given 06/17/16 0119)  potassium chloride SA (K-DUR,KLOR-CON) CR tablet 40 mEq (40 mEq Oral Given 06/17/16 0119)     Initial Impression / Assessment and Plan / ED Course  I have reviewed the triage vital signs and the nursing notes.  Pertinent labs & imaging results that were available during my care of the patient were reviewed by me and considered in my medical decision making (see chart for details).  Clinical Course     Vitals:   06/16/16 2002 06/16/16 2004 06/16/16 2330  BP: 119/68  105/68  Pulse: (!) 122  88  Resp: 18  14  Temp: 99.5 F (37.5 C)  98.7 F (37.1 C)  TempSrc: Oral  Oral  SpO2: 98%  100%  Weight:  65.3 kg   Height:  5\' 2"  (1.575 m)     Medications  oseltamivir (TAMIFLU) capsule 75 mg (75 mg Oral Given 06/17/16 0119)  magnesium oxide (MAG-OX) tablet 400 mg (400 mg Oral Given 06/17/16 0119)  potassium chloride SA (K-DUR,KLOR-CON) CR tablet 40 mEq (40 mEq Oral Given 06/17/16  0119)    Kaelynn Igo is 26 y.o. female presenting with Fluent-like illness with symptoms of productive cough, sore throat, nausea vomiting, tactile fever, rhinorrhea, myalgia. She has positive sick contacts in that her 61-year-old child was recently diagnosed with flu a. Her pregnancy it think it is prudent to start her on Tamiflu. She is initially quite tachycardic however this is resolved over time. Abdominal exam is benign. She reports no lower abdominal issues including difficulty with urination, vaginal bleeding or vaginal discharge. Urinalysis with 80 ketones, advised that she will need to push fluids. I've offered to give her an IV and fluids however she declines and states that she can drink by mouth. She also has trace amounts of leukocytes, will hold on treatment at this point and culture. Blood work with a mild hypokalemia, will replete orally. Repeat abdominal exam remains benign and vital signs have improved at discharge. She is a she needs to follow-up for recheck with OB/GYN shortly with had extensive discussion of return precautions  Evaluation does not show pathology that would require ongoing emergent intervention or inpatient treatment. Pt is hemodynamically stable and mentating appropriately. Discussed findings and plan with patient/guardian, who agrees with care plan. All questions answered. Return precautions discussed and outpatient follow up given.   Final Clinical Impressions(s) / ED Diagnoses   Final diagnoses:  Influenza-like illness    New Prescriptions Discharge Medication List as of 06/17/2016  2:40 AM    START taking these medications   Details  oseltamivir (TAMIFLU) 75 MG capsule Take 1 capsule (75 mg total) by mouth every 12 (twelve) hours., Starting Sun 06/17/2016, Print    potassium chloride SA (K-DUR,KLOR-CON) 20 MEQ tablet Take 1 tablet (20 mEq total) by mouth daily., Starting Sun 06/17/2016, [employer] Rudolpho Claxton, PA-C 06/17/16 1610    Geoffery Lyons, MD 06/17/16 9604

## 2016-06-17 NOTE — Discharge Instructions (Signed)
For fever control you can take 650 mg of acetaminophen (Tylenol) this is normally 2 over the counter pills; every 4-6 hours, do not take any other medications that have acetaminophen as an ingredient. Do not drink alcohol.  Return to the emergency room for any worsening or concerning symptoms including fast breathing, heart racing, confusion, vomiting.  Rest, cover your mouth when you cough and wash your hands frequently.   Push fluids: water or Gatorade, do not drink any soda, juice or caffeinated beverages  Do not return to work until a day after your fever breaks.

## 2016-06-17 NOTE — ED Notes (Signed)
Pt understood dc material. NAD noted. Scripts given at [facility] along with work note

## 2016-06-17 NOTE — ED Notes (Signed)
ED Provider at bedside. 

## 2016-06-18 LAB — URINE CULTURE

## 2016-06-27 ENCOUNTER — Ambulatory Visit (INDEPENDENT_AMBULATORY_CARE_PROVIDER_SITE_OTHER): Payer: Medicaid Other | Admitting: Advanced Practice Midwife

## 2016-06-27 ENCOUNTER — Encounter: Payer: Medicaid Other | Admitting: Certified Nurse Midwife

## 2016-06-27 DIAGNOSIS — Z3490 Encounter for supervision of normal pregnancy, unspecified, unspecified trimester: Secondary | ICD-10-CM

## 2016-06-27 DIAGNOSIS — Z3492 Encounter for supervision of normal pregnancy, unspecified, second trimester: Secondary | ICD-10-CM

## 2016-06-27 NOTE — Patient Instructions (Signed)
Second Trimester of Pregnancy The second trimester is from week 13 through week 28 (months 4 through 6). The second trimester is often a time when you feel your best. Your body has also adjusted to being pregnant, and you begin to feel better physically. Usually, morning sickness has lessened or quit completely, you may have more energy, and you may have an increase in appetite. The second trimester is also a time when the fetus is growing rapidly. At the end of the sixth month, the fetus is about 9 inches long and weighs about 1 pounds. You will likely begin to feel the baby move (quickening) between 18 and 20 weeks of the pregnancy. Body changes during your second trimester Your body continues to go through many changes during your second trimester. The changes vary from woman to woman.  Your weight will continue to increase. You will notice your lower abdomen bulging out.  You may begin to get stretch marks on your hips, abdomen, and breasts.  You may develop headaches that can be relieved by medicines. The medicines should be approved by your health care provider.  You may urinate more often because the fetus is pressing on your bladder.  You may develop or continue to have heartburn as a result of your pregnancy.  You may develop constipation because certain hormones are causing the muscles that push waste through your intestines to slow down.  You may develop hemorrhoids or swollen, bulging veins (varicose veins).  You may have back pain. This is caused by:  Weight gain.  Pregnancy hormones that are relaxing the joints in your pelvis.  A shift in weight and the muscles that support your balance.  Your breasts will continue to grow and they will continue to become tender.  Your gums may bleed and may be sensitive to brushing and flossing.  Dark spots or blotches (chloasma, mask of pregnancy) may develop on your face. This will likely fade after the baby is born.  A dark line  from your belly button to the pubic area (linea nigra) may appear. This will likely fade after the baby is born.  You may have changes in your hair. These can include thickening of your hair, rapid growth, and changes in texture. Some women also have hair loss during or after pregnancy, or hair that feels dry or thin. Your hair will most likely return to normal after your baby is born. What to expect at prenatal visits During a routine prenatal visit:  You will be weighed to make sure you and the fetus are growing normally.  Your blood pressure will be taken.  Your abdomen will be measured to track your baby's growth.  The fetal heartbeat will be listened to.  Any test results from the previous visit will be discussed. Your health care provider may ask you:  How you are feeling.  If you are feeling the baby move.  If you have had any abnormal symptoms, such as leaking fluid, bleeding, severe headaches, or abdominal cramping.  If you are using any tobacco products, including cigarettes, chewing tobacco, and electronic cigarettes.  If you have any questions. Other tests that may be performed during your second trimester include:  Blood tests that check for:  Low iron levels (anemia).  Gestational diabetes (between 24 and 28 weeks).  Rh antibodies. This is to check for a protein on red blood cells (Rh factor).  Urine tests to check for infections, diabetes, or protein in the urine.  An ultrasound to   confirm the proper growth and development of the baby.  An amniocentesis to check for possible genetic problems.  Fetal screens for spina bifida and Down syndrome.  HIV (human immunodeficiency virus) testing. Routine prenatal testing includes screening for HIV, unless you choose not to have this test. Follow these instructions at home: Eating and drinking  Continue to eat regular, healthy meals.  Avoid raw meat, uncooked cheese, cat litter boxes, and soil used by cats. These  carry germs that can cause birth defects in the baby.  Take your prenatal vitamins.  Take 1500-2000 mg of calcium daily starting at the 20th week of pregnancy until you deliver your baby.  If you develop constipation:  Take over-the-counter or prescription medicines.  Drink enough fluid to keep your urine clear or pale yellow.  Eat foods that are high in fiber, such as fresh fruits and vegetables, whole grains, and beans.  Limit foods that are high in fat and processed sugars, such as fried and sweet foods. Activity  Exercise only as directed by your health care provider. Experiencing uterine cramps is a good sign to stop exercising.  Avoid heavy lifting, wear low heel shoes, and practice good posture.  Wear your seat belt at all times when driving.  Rest with your legs elevated if you have leg cramps or low back pain.  Wear a good support bra for breast tenderness.  Do not use hot tubs, steam rooms, or saunas. Lifestyle  Avoid all smoking, herbs, alcohol, and unprescribed drugs. These chemicals affect the formation and growth of the baby.  Do not use any products that contain nicotine or tobacco, such as cigarettes and e-cigarettes. If you need help quitting, ask your health care provider.  A sexual relationship may be continued unless your health care provider directs you otherwise. General instructions  Follow your health care provider's instructions regarding medicine use. There are medicines that are either safe or unsafe to take during pregnancy.  Take warm sitz baths to soothe any pain or discomfort caused by hemorrhoids. Use hemorrhoid cream if your health care provider approves.  If you develop varicose veins, wear support hose. Elevate your feet for 15 minutes, 3-4 times a day. Limit salt in your diet.  Visit your dentist if you have not gone yet during your pregnancy. Use a soft toothbrush to brush your teeth and be gentle when you floss.  Keep all follow-up  prenatal visits as told by your health care provider. This is important. Contact a health care provider if:  You have dizziness.  You have mild pelvic cramps, pelvic pressure, or nagging pain in the abdominal area.  You have persistent nausea, vomiting, or diarrhea.  You have a bad smelling vaginal discharge.  You have pain with urination. Get help right away if:  You have a fever.  You are leaking fluid from your vagina.  You have spotting or bleeding from your vagina.  You have severe abdominal cramping or pain.  You have rapid weight gain or weight loss.  You have shortness of breath with chest pain.  You notice sudden or extreme swelling of your face, hands, ankles, feet, or legs.  You have not felt your baby move in over an hour.  You have severe headaches that do not go away with medicine.  You have vision changes. Summary  The second trimester is from week 13 through week 28 (months 4 through 6). It is also a time when the fetus is growing rapidly.  Your body goes   through many changes during pregnancy. The changes vary from woman to woman.  Avoid all smoking, herbs, alcohol, and unprescribed drugs. These chemicals affect the formation and growth your baby.  Do not use any tobacco products, such as cigarettes, chewing tobacco, and e-cigarettes. If you need help quitting, ask your health care provider.  Contact your health care provider if you have any questions. Keep all prenatal visits as told by your health care provider. This is important. This information is not intended to replace advice given to you by your health care provider. Make sure you discuss any questions you have with your health care provider. Document Released: 05/15/2001 Document Revised: 10/27/2015 Document Reviewed: 07/22/2012 [employer]vier Interactive Patient Education  2017 [employer]  

## 2016-06-27 NOTE — Progress Notes (Signed)
   PRENATAL VISIT NOTE  Subjective:  Jennifer SettingMaria Peters is a 26 y.o. [phone] at 4997w6d being seen today for ongoing prenatal care.  She is currently monitored for the following issues for this low-risk pregnancy and has Anemia; Post partum depression; Abnormal Pap smear of cervix; Encounter for supervision of normal pregnancy, antepartum; and Low vitamin D level on her problem list.  Patient reports no complaints.  Contractions: Not present. Vag. Bleeding: None.  Movement: Present. Denies leaking of fluid.   The following portions of the patient's history were reviewed and updated as appropriate: allergies, current medications, past family history, past medical history, past social history, past surgical history and problem list. Problem list updated.  Objective:   Vitals:   06/27/16 0932  BP: 112/78  Pulse: 92  Temp: 98.7 F (37.1 C)  Weight: 143 lb 6.4 oz (65 kg)    Fetal Status: Fetal Heart Rate (bpm): 155   Movement: Present     Fundal height 2 below umbilicus  General:  Alert, oriented and cooperative. Patient is in no acute distress.  Skin: Skin is warm and dry. No rash noted.   Cardiovascular: Normal heart rate noted  Respiratory: Normal respiratory effort, no problems with respiration noted  Abdomen: Soft, gravid, appropriate for gestational age. Pain/Pressure: Absent     Pelvic:  Cervical exam deferred        Extremities: Normal range of motion.  Edema: Trace  Mental Status: Normal mood and affect. Normal behavior. Normal judgment and thought content.   Assessment and Plan:  Pregnancy: [id number] at 7297w6d  1. Encounter for supervision of normal pregnancy, antepartum, unspecified gravidity --Reviewed EDC with pt.  EDC based on 11 week US which was 6 days different from an unsure LMP.  [facility]   Preterm labor symptoms and general obstetric precautions including but not limited to vaginal bleeding, contractions, leaking of fluid and fetal movement were  reviewed in detail with the patient. Please refer to After Visit Summary for other counseling recommendations.  Return in about 4 weeks (around 07/25/2016).   Hurshel PartyLisa A Leftwich-Kirby, CNM

## 2016-07-03 ENCOUNTER — Encounter (HOSPITAL_COMMUNITY): Payer: Self-pay | Admitting: Certified Nurse Midwife

## 2016-07-09 ENCOUNTER — Other Ambulatory Visit: Payer: Self-pay | Admitting: Certified Nurse Midwife

## 2016-07-09 ENCOUNTER — Ambulatory Visit (HOSPITAL_COMMUNITY)
Admission: RE | Admit: 2016-07-09 | Discharge: 2016-07-09 | Disposition: A | Discharge status: Home / Self Care | Payer: Medicaid Other | Source: Ambulatory Visit | Attending: Certified Nurse Midwife | Admitting: Certified Nurse Midwife

## 2016-07-09 DIAGNOSIS — Z3490 Encounter for supervision of normal pregnancy, unspecified, unspecified trimester: Secondary | ICD-10-CM

## 2016-07-09 DIAGNOSIS — Z3A19 19 weeks gestation of pregnancy: Secondary | ICD-10-CM | POA: Diagnosis not present

## 2016-07-09 DIAGNOSIS — Z363 Encounter for antenatal screening for malformations: Secondary | ICD-10-CM | POA: Diagnosis present

## 2016-07-09 DIAGNOSIS — Z3480 Encounter for supervision of other normal pregnancy, unspecified trimester: Secondary | ICD-10-CM

## 2016-07-25 ENCOUNTER — Encounter: Payer: Medicaid Other | Admitting: Certified Nurse Midwife

## 2016-08-01 ENCOUNTER — Encounter: Payer: Self-pay | Admitting: Certified Nurse Midwife

## 2016-08-01 ENCOUNTER — Ambulatory Visit (INDEPENDENT_AMBULATORY_CARE_PROVIDER_SITE_OTHER): Payer: Medicaid Other | Admitting: Certified Nurse Midwife

## 2016-08-01 VITALS — BP 110/77 | HR 85 | Wt 148.0 lb

## 2016-08-01 DIAGNOSIS — D509 Iron deficiency anemia, unspecified: Secondary | ICD-10-CM

## 2016-08-01 DIAGNOSIS — R7989 Other specified abnormal findings of blood chemistry: Secondary | ICD-10-CM

## 2016-08-01 DIAGNOSIS — Z3480 Encounter for supervision of other normal pregnancy, unspecified trimester: Secondary | ICD-10-CM

## 2016-08-01 DIAGNOSIS — O99345 Other mental disorders complicating the puerperium: Principal | ICD-10-CM

## 2016-08-01 DIAGNOSIS — O99013 Anemia complicating pregnancy, third trimester: Secondary | ICD-10-CM

## 2016-08-01 DIAGNOSIS — F53 Postpartum depression: Secondary | ICD-10-CM

## 2016-08-01 NOTE — Progress Notes (Signed)
   PRENATAL VISIT NOTE  Subjective:  Jennifer SettingMaria Peters is a 26 y.o. [phone] at 6477w6d being seen today for ongoing prenatal care.  She is currently monitored for the following issues for this low-risk pregnancy and has Anemia; Encounter for supervision of normal pregnancy, antepartum; and Low vitamin D level on her problem list.  Patient reports no complaints.  Contractions: Not present. Vag. Bleeding: None.  Movement: Present. Denies leaking of fluid.   The following portions of the patient's history were reviewed and updated as appropriate: allergies, current medications, past family history, past medical history, past social history, past surgical history and problem list. Problem list updated.  Objective:   Vitals:   08/01/16 0838  BP: 110/77  Pulse: 85  Weight: 148 lb (67.1 kg)    Fetal Status: Fetal Heart Rate (bpm): 152 Fundal Height: 22 cm Movement: Present     General:  Alert, oriented and cooperative. Patient is in no acute distress.  Skin: Skin is warm and dry. No rash noted.   Cardiovascular: Normal heart rate noted  Respiratory: Normal respiratory effort, no problems with respiration noted  Abdomen: Soft, gravid, appropriate for gestational age. Pain/Pressure: Absent     Pelvic:  Cervical exam deferred        Extremities: Normal range of motion.     Mental Status: Normal mood and affect. Normal behavior. Normal judgment and thought content.   Assessment and Plan:  Pregnancy: [id number] at 377w6d  1. Supervision of other normal pregnancy, antepartum     Doing well.   2. Low vitamin D level     Taking vit.D supplementation   3. Iron deficiency anemia, unspecified iron deficiency anemia type     Taking OTC iron.   Preterm labor symptoms and general obstetric precautions including but not limited to vaginal bleeding, contractions, leaking of fluid and fetal movement were reviewed in detail with the patient. Please refer to After Visit Summary for other counseling  recommendations.  Return for babyscripts, 2 hr OGTT 28 wks.   Roe Coombsachelle A Denney, CNM

## 2016-08-07 ENCOUNTER — Inpatient Hospital Stay (HOSPITAL_COMMUNITY)
Admission: AD | Admit: 2016-08-07 | Discharge: 2016-08-07 | Disposition: A | Discharge status: Home / Self Care | Payer: Medicaid Other | Source: Ambulatory Visit | Attending: Obstetrics & Gynecology | Admitting: Obstetrics & Gynecology

## 2016-08-07 ENCOUNTER — Encounter (HOSPITAL_COMMUNITY): Payer: Self-pay | Admitting: Student

## 2016-08-07 DIAGNOSIS — Z3480 Encounter for supervision of other normal pregnancy, unspecified trimester: Secondary | ICD-10-CM

## 2016-08-07 DIAGNOSIS — Z87891 Personal history of nicotine dependence: Secondary | ICD-10-CM | POA: Diagnosis not present

## 2016-08-07 DIAGNOSIS — O99352 Diseases of the nervous system complicating pregnancy, second trimester: Secondary | ICD-10-CM | POA: Insufficient documentation

## 2016-08-07 DIAGNOSIS — Z3A23 23 weeks gestation of pregnancy: Secondary | ICD-10-CM | POA: Insufficient documentation

## 2016-08-07 DIAGNOSIS — G5600 Carpal tunnel syndrome, unspecified upper limb: Secondary | ICD-10-CM | POA: Diagnosis not present

## 2016-08-07 DIAGNOSIS — O99350 Diseases of the nervous system complicating pregnancy, unspecified trimester: Secondary | ICD-10-CM | POA: Diagnosis not present

## 2016-08-07 DIAGNOSIS — R202 Paresthesia of skin: Secondary | ICD-10-CM | POA: Diagnosis present

## 2016-08-07 LAB — URINALYSIS, ROUTINE W REFLEX MICROSCOPIC
Bilirubin Urine: NEGATIVE
GLUCOSE, UA: NEGATIVE mg/dL
Hgb urine dipstick: NEGATIVE
Ketones, ur: NEGATIVE mg/dL
Nitrite: NEGATIVE
PROTEIN: NEGATIVE mg/dL
Specific Gravity, Urine: 1.008 (ref 1.005–1.030)
pH: 7 (ref 5.0–8.0)

## 2016-08-07 MED ORDER — WRIST BRACE/RIGHT MEDIUM MISC: 1.0000 | Freq: Every day | 0 refills | Status: DC

## 2016-08-07 NOTE — MAU Provider Note (Signed)
Chief Complaint:  Hand Pain   First Provider Initiated Contact with Patient 08/07/16 1959      HPI: Jennifer Peters is a 26 y.o. [id number] at 10223w5dwho presents to maternity admissions reporting tingling/numbness in her right arm and hand with onset this morning. She reports she has had this symptom before after waking up but it usually resolves. Since it has continued all day, she called the after hours nurse and was told to come in to be evaluated. Nothing makes the symptom better or worse.  It has no associated symptoms.  She reports good fetal movement, denies abdominal pain, LOF, vaginal bleeding, vaginal itching/burning, urinary symptoms, h/a, dizziness, n/v, or fever/chills.    HPI  Past Medical History: Past Medical History:  Diagnosis Date  . Abnormal Pap smear of cervix    unknown stage of abnormality  . Anemia   . Hx of chlamydia infection   . Post partum depression     Past obstetric history: OB History  Gravida Para Term Preterm AB Living  4 2 2  0 1 2  SAB TAB Ectopic Multiple Live Births  1 0 0 0 2    # Outcome Date GA Lbr Len/2nd Weight Sex Delivery Anes PTL Lv  4 Current           3 Term 04/16/12 2749w0d 21:07 / 00:08 6 lb 1.4 oz (2.761 kg) M Vag-Spont EPI  LIV     Birth Comments: wdl  2 Term 01/2010 56108w0d  5 lb 9 oz (2.523 kg) F Vag-Spont EPI  LIV     Birth Comments: "Cold" at birth, but stayed 9 hrs in NICU and was fine.  1 SAB 2009 7633w0d       DEC     Birth Comments: Had D&C @ 13 weeks       Past Surgical History: Past Surgical History:  Procedure Laterality Date  . DILATION AND CURETTAGE OF UTERUS    . LAPAROSCOPIC APPENDECTOMY  06/14/2012   Procedure: APPENDECTOMY LAPAROSCOPIC;  Surgeon: Clovis Puhomas A. Cornett, MD;  Location: [facility] OR;  Service: General;  Laterality: N/A;  . wisdom tooth extracted x1 in 04/2011      Family History: Family History  Problem Relation Age of Onset  . Kidney disease Maternal Grandmother   . Heart disease Maternal Grandmother     chf   . Diabetes Maternal Aunt   . Hypertension Mother   . Other Neg Hx     Social History: Social History  Substance Use Topics  . Smoking status: Former Smoker    Packs/day: 0.50    Years: 3.00    Quit date: 08/21/2011  . Smokeless tobacco: Never Used  . Alcohol use No    Allergies:  Allergies  Allergen Reactions  . Latex Itching and Rash    Meds:  Prescriptions Prior to Admission  Medication Sig Dispense Refill Last Dose  . oseltamivir (TAMIFLU) 75 MG capsule Take 1 capsule (75 mg total) by mouth every 12 (twelve) hours. 10 capsule 0   . potassium chloride SA (K-DUR,KLOR-CON) 20 MEQ tablet Take 1 tablet (20 mEq total) by mouth daily. 3 tablet 0   . Prenatal Vit-Fe Phos-FA-Omega (VITAFOL GUMMIES) 3.33-0.333-34.8 MG CHEW Chew 3 tablets by mouth at bedtime. 90 tablet 12 06/15/2016 at Unknown time    ROS:  Review of Systems  Constitutional: Negative for chills, fatigue and fever.  Respiratory: Negative for shortness of breath.   Cardiovascular: Negative for chest pain.  Genitourinary: Negative for difficulty urinating, dysuria, flank  pain, pelvic pain, vaginal bleeding, vaginal discharge and vaginal pain.  Musculoskeletal: Positive for arthralgias and myalgias.  Neurological: Positive for weakness and numbness. Negative for dizziness and headaches.       Numbness/tingling of right hand  Psychiatric/Behavioral: Negative.      I have reviewed patient's Past Medical Hx, Surgical Hx, Family Hx, Social Hx, medications and allergies.   Physical Exam  Patient Vitals for the past 24 hrs:  BP Temp Temp src Pulse Resp Height Weight  08/07/16 1938 125/62 98 F (36.7 C) Oral 90 18 5\' 2"  (1.575 m) 154 lb (69.9 kg)   Constitutional: Well-developed, well-nourished female in no acute distress.  Cardiovascular: normal rate Respiratory: normal effort GI: Abd soft, non-tender, gravid appropriate for gestational age.  MS: Extremities nontender, no edema, normal ROM, normal sensation and  strength in right and left hands, temperature warm/normal in both hands Neurological - alert, oriented, normal speech, no focal findings or movement disorder noted, screening mental status exam normal, cranial nerves II through XII intact, DTR's normal and symmetric, motor and sensory grossly normal bilaterally, normal muscle tone, no tremors, strength 5/5 GU: Neg CVAT.     FHT:  Baseline 145 , moderate variability, accelerations present, no decelerations Contractions: None on toco or to palpation  Labs: Results for orders placed or performed during the hospital encounter of 08/07/16 (from the past 24 hour(s))  Urinalysis, Routine w reflex microscopic     Status: Abnormal   Collection Time: 08/07/16  7:33 PM  Result Value Ref Range   Color, Urine YELLOW YELLOW   APPearance CLEAR CLEAR   Specific Gravity, Urine 1.008 1.005 - 1.030   pH 7.0 5.0 - 8.0   Glucose, UA NEGATIVE NEGATIVE mg/dL   Hgb urine dipstick NEGATIVE NEGATIVE   Bilirubin Urine NEGATIVE NEGATIVE   Ketones, ur NEGATIVE NEGATIVE mg/dL   Protein, ur NEGATIVE NEGATIVE mg/dL   Nitrite NEGATIVE NEGATIVE   Leukocytes, UA LARGE (A) NEGATIVE   RBC / HPF 0-5 0 - 5 RBC/hpf   WBC, UA 0-5 0 - 5 WBC/hpf   Bacteria, UA RARE (A) NONE SEEN   Squamous Epithelial / LPF 0-5 (A) NONE SEEN   Mucous PRESENT    A/Positive/-- (11/29 1614)  Imaging:  [place] Mfm Ob Comp + 14 Wk  Result Date: 07/09/2016 ----------------------------------------------------------------------  OBSTETRICS REPORT                      (Signed Final 07/09/2016 10:13 am) ---------------------------------------------------------------------- Patient Info  ID #:       [id number]                         D.O.B.:   [birth year] (25 yrs)  Name:       Jennifer Peters                   Visit Date:  07/09/2016 09:59 am ---------------------------------------------------------------------- Performed By  Performed By:     Emeline Darling BS,      Ref. Address:     [address]                     RDMS  [address]                                                             [place]                                                             [id number]  Attending:        Clarene Critchley Whitecar        Location:         [facility]                    MD  Referred By:      Roe Coombs CNM ---------------------------------------------------------------------- Orders   #  Description                                 Code   1  [place] MFM OB COMP + 14 WK                      [id number]  ----------------------------------------------------------------------   #  Ordered By               Order #        Accession #    Episode #   1  RACHELLE Marjo Bicker          [id number]      [id number]     [id number]  ---------------------------------------------------------------------- Indications   [redacted] weeks gestation of pregnancy                Z3A.19   Encounter for antenatal screening for          Z36.3   malformations  ---------------------------------------------------------------------- OB History  Blood Type:            Height:  5'2"   Weight (lb):  143      BMI:   26.15  Gravidity:    4         Term:   2         SAB:   1  Living:       2 ---------------------------------------------------------------------- Fetal Evaluation  Num Of Fetuses:     1  Fetal Heart         168  Rate(bpm):  Cardiac Activity:   Observed  Presentation:       Cephalic  Placenta:           Anterior, above cervical os  P. Cord Insertion:  Visualized  Amniotic Fluid  AFI FV:      Subjectively within normal limits                              Largest Pocket(cm)  5.6 ---------------------------------------------------------------------- Biometry  BPD:      41.8  mm     G. Age:  18w 4d         15  %    CI:        67.67   %   70 - 86                                                          FL/HC:      17.9   %   16.8 - 19.8  HC:      162.6  mm     G. Age:   19w 0d         20  %    HC/AC:      1.14       1.09 - 1.39  AC:      142.5  mm     G. Age:  19w 4d         46  %    FL/BPD:     69.6   %  FL:       29.1  mm     G. Age:  19w 0d         22  %    FL/AC:      20.4   %   20 - 24  NFT:       3.9  mm  Est. FW:     282  gm    0 lb 10 oz      41  % ---------------------------------------------------------------------- Gestational Age  LMP:           18w 5d       Date:   02/29/16                 EDD:   12/05/16  U/S Today:     19w 0d                                        EDD:   12/03/16  Best:          19w 4d    Det. By:   Marcella Dubs         EDD:   11/29/16                                      (05/15/16) ---------------------------------------------------------------------- Anatomy  Cranium:               Appears normal         Aortic Arch:            Appears normal  Cavum:                 Appears normal         Ductal Arch:            Appears normal  Ventricles:            Appears normal         Diaphragm:              Appears normal  Choroid  Plexus:        Appears normal         Stomach:                Appears normal, left                                                                        sided  Cerebellum:            Appears normal         Abdomen:                Appears normal  Posterior Fossa:       Appears normal         Abdominal Wall:         Appears nml (cord                                                                        insert, abd wall)  Nuchal Fold:           Appears normal         Cord Vessels:           Appears normal (3                                                                        vessel cord)  Face:                  Appears normal         Kidneys:                Appear normal                         (orbits and profile)  Lips:                  Appears normal         Bladder:                Appears normal  Thoracic:              Appears normal         Spine:                  Appears normal  Heart:                 Appears normal          Upper Extremities:      Appears normal                         (4CH, axis, and situs  RVOT:                  Appears normal         Lower Extremities:      Appears normal  LVOT:                  Appears normal  Other:  Female gender. Heels and 5th digit visualized. Technically difficult due          to fetal position. ---------------------------------------------------------------------- Cervix Uterus Adnexa  Cervix  Length:            4.1  cm.  Normal appearance by transabdominal scan. ---------------------------------------------------------------------- Impression  Single IUP at 19w 4d  Normal fetal anatomic survey  No markers associated with aneuploidy were noted  Anterior placenta without previa  Normal amniotic fluid volume ---------------------------------------------------------------------- Recommendations  Follow-up ultrasounds as clinically indicated. ----------------------------------------------------------------------                Candis Shine, MD Electronically Signed Final Report   07/09/2016 10:13 am ----------------------------------------------------------------------   MAU Course/MDM: I have ordered labs and reviewed results.  NST reviewed Symptoms and exam c/w carpal tunnel.  No evidence of circulatory or neurological deficit.  Offered single dose of ibuprofen since pt is in second trimester but pt declined.  Reassurance provided, Rx for wrist brace, and pt tu take Tylenol/use ice PRN.  Pt to f/u in office as scheduled. Pt stable at time of discharge.  Assessment: 1. Pregnancy related carpal tunnel syndrome, antepartum   2. Supervision of other normal pregnancy, antepartum     Plan: Discharge home  Follow-up Information    [facility] Follow up.   Why:  Keep scheduled appointments.  Return to MAU as needed for emergencies. Contact information: [address] [place] [address] [phone]         Allergies as of  08/07/2016      Reactions   Latex Itching, Rash      Medication List    STOP taking these medications   oseltamivir 75 MG capsule Commonly known as:  TAMIFLU   potassium chloride SA 20 MEQ tablet Commonly known as:  K-DUR,KLOR-CON     TAKE these medications   VITAFOL GUMMIES 3.33-0.333-34.8 MG Chew Chew 3 tablets by mouth at bedtime.   Wrist Brace/Right Medium Misc 1 Device by Does not apply route daily.       Sharen Counter Certified Nurse-Midwife 08/07/2016 8:18 PM

## 2016-08-07 NOTE — MAU Note (Signed)
Pt presents complaining of right hand and arm numbness and tingling. States it started last night and it hasn't stopped. Denies pain. Denies vaginal bleeding or discharge. Reports good fetal movement.

## 2016-08-09 ENCOUNTER — Telehealth: Payer: Self-pay

## 2016-08-09 NOTE — Telephone Encounter (Signed)
Pharmacy called wanting an ICD code for patients carpel tunnel symptoms.

## 2016-09-05 ENCOUNTER — Ambulatory Visit (INDEPENDENT_AMBULATORY_CARE_PROVIDER_SITE_OTHER): Payer: Medicaid Other | Admitting: Certified Nurse Midwife

## 2016-09-05 ENCOUNTER — Other Ambulatory Visit: Payer: Medicaid Other

## 2016-09-05 VITALS — BP 136/92 | HR 91 | Wt 158.0 lb

## 2016-09-05 DIAGNOSIS — Z23 Encounter for immunization: Secondary | ICD-10-CM

## 2016-09-05 DIAGNOSIS — Z3480 Encounter for supervision of other normal pregnancy, unspecified trimester: Secondary | ICD-10-CM

## 2016-09-05 DIAGNOSIS — O162 Unspecified maternal hypertension, second trimester: Secondary | ICD-10-CM

## 2016-09-05 DIAGNOSIS — Z029 Encounter for administrative examinations, unspecified: Secondary | ICD-10-CM

## 2016-09-05 DIAGNOSIS — Z3481 Encounter for supervision of other normal pregnancy, first trimester: Secondary | ICD-10-CM

## 2016-09-05 DIAGNOSIS — O132 Gestational [pregnancy-induced] hypertension without significant proteinuria, second trimester: Secondary | ICD-10-CM

## 2016-09-05 NOTE — Progress Notes (Signed)
Received phone call today from [facility] with increase in BP readings. Initial reading was 135/98 with repeat BP of 132/96. R.Denney, CNM has been made aware of call.

## 2016-09-05 NOTE — Progress Notes (Signed)
Patient is having a really bad time with her carpel tunnel syndrome. She is also concerned about her BP and she does have times during her day that she sees "dots"- but reports no dizziness.

## 2016-09-05 NOTE — Progress Notes (Signed)
Notified of increased blood pressures discussed with Dr. Debroah Loop: [facility] labs and f/u blood pressure check on Friday.  No meds at this time.  Diet and reducing salt intake discussed with patient.  Patient reports numbness and swelling in hands.       PRENATAL VISIT NOTE  Subjective:  Jennifer Peters is a 26 y.o. [phone] at [redacted]w[redacted]d being seen today for ongoing prenatal care.  She is currently monitored for the following issues for this low-risk pregnancy and has Anemia; Encounter for supervision of normal pregnancy, antepartum; and Low vitamin D level on her problem list.  Patient reports see above notation.  Contractions: Irritability. Vag. Bleeding: None.  Movement: Present. Denies leaking of fluid.   The following portions of the patient's history were reviewed and updated as appropriate: allergies, current medications, past family history, past medical history, past social history, past surgical history and problem list. Problem list updated.  Objective:   Vitals:   09/05/16 0850  BP: (!) 136/92  Pulse: 91  Weight: 158 lb (71.7 kg)    Fetal Status: Fetal Heart Rate (bpm): 150 Fundal Height: 27 cm Movement: Present     General:  Alert, oriented and cooperative. Patient is in no acute distress.  Skin: Skin is warm and dry. No rash noted.   Cardiovascular: Normal heart rate noted  Respiratory: Normal respiratory effort, no problems with respiration noted  Abdomen: Soft, gravid, appropriate for gestational age. Pain/Pressure: Absent     Pelvic:  Cervical exam deferred        Extremities: Normal range of motion.  Edema: None  Mental Status: Normal mood and affect. Normal behavior. Normal judgment and thought content.   Assessment and Plan:  Pregnancy: [id number] at [redacted]w[redacted]d  1. Encounter for supervision of other normal pregnancy in first trimester       - Glucose Tolerance, 2 Hours w/1 Hour - CBC - HIV antibody - RPR - Tdap vaccine greater than or equal to 7yo IM  2. Elevated blood  pressure affecting pregnancy in second trimester, antepartum      - Creatinine clearance, urine, 24 hour; Future - Protein, urine, 24 hour; Future - Lactate dehydrogenase - Creatinine, serum - ALT - AST - Pathologist smear review - Protein / creatinine ratio, urine  3. Supervision of other normal pregnancy, antepartum      Preterm labor symptoms and general obstetric precautions including but not limited to vaginal bleeding, contractions, leaking of fluid and fetal movement were reviewed in detail with the patient. Please refer to After Visit Summary for other counseling recommendations.  Return in about 2 weeks (around 09/19/2016) for ROB, Friday nurse visit blood pressure check.   Roe Coombs, CNM

## 2016-09-06 ENCOUNTER — Inpatient Hospital Stay (HOSPITAL_COMMUNITY)
Admission: AD | Admit: 2016-09-06 | Discharge: 2016-09-06 | Disposition: A | Discharge status: Home / Self Care | Payer: Medicaid Other | Source: Ambulatory Visit | Attending: Obstetrics & Gynecology | Admitting: Obstetrics & Gynecology

## 2016-09-06 ENCOUNTER — Telehealth: Payer: Self-pay | Admitting: *Deleted

## 2016-09-06 ENCOUNTER — Other Ambulatory Visit: Payer: Self-pay | Admitting: Certified Nurse Midwife

## 2016-09-06 DIAGNOSIS — Z3A28 28 weeks gestation of pregnancy: Secondary | ICD-10-CM | POA: Diagnosis not present

## 2016-09-06 DIAGNOSIS — Z79899 Other long term (current) drug therapy: Secondary | ICD-10-CM | POA: Diagnosis not present

## 2016-09-06 DIAGNOSIS — Z87891 Personal history of nicotine dependence: Secondary | ICD-10-CM | POA: Insufficient documentation

## 2016-09-06 DIAGNOSIS — O133 Gestational [pregnancy-induced] hypertension without significant proteinuria, third trimester: Secondary | ICD-10-CM | POA: Diagnosis not present

## 2016-09-06 DIAGNOSIS — Z3480 Encounter for supervision of other normal pregnancy, unspecified trimester: Secondary | ICD-10-CM

## 2016-09-06 DIAGNOSIS — O163 Unspecified maternal hypertension, third trimester: Secondary | ICD-10-CM | POA: Diagnosis present

## 2016-09-06 LAB — COMPREHENSIVE METABOLIC PANEL
ALT: 17 U/L (ref 14–54)
ANION GAP: 7 (ref 5–15)
AST: 20 U/L (ref 15–41)
Albumin: 3.2 g/dL — ABNORMAL LOW (ref 3.5–5.0)
Alkaline Phosphatase: 67 U/L (ref 38–126)
BILIRUBIN TOTAL: 0.6 mg/dL (ref 0.3–1.2)
BUN: 8 mg/dL (ref 6–20)
CO2: 22 mmol/L (ref 22–32)
Calcium: 8.7 mg/dL — ABNORMAL LOW (ref 8.9–10.3)
Chloride: 106 mmol/L (ref 101–111)
Creatinine, Ser: 0.62 mg/dL (ref 0.44–1.00)
GFR calc Af Amer: >60 mL/min (ref >60)
Glucose, Bld: 84 mg/dL (ref 65–99)
POTASSIUM: 3.7 mmol/L (ref 3.5–5.1)
Sodium: 135 mmol/L (ref 135–145)
TOTAL PROTEIN: 6.5 g/dL (ref 6.5–8.1)

## 2016-09-06 LAB — CREATININE, SERUM
CREATININE: 0.75 mg/dL (ref 0.57–1.00)
GFR calc Af Amer: 126 mL/min/{1.73_m2} (ref >59)
GFR calc non Af Amer: 110 mL/min/{1.73_m2} (ref >59)

## 2016-09-06 LAB — URINALYSIS, ROUTINE W REFLEX MICROSCOPIC
Bilirubin Urine: NEGATIVE
GLUCOSE, UA: NEGATIVE mg/dL
HGB URINE DIPSTICK: NEGATIVE
Ketones, ur: NEGATIVE mg/dL
NITRITE: NEGATIVE
Protein, ur: NEGATIVE mg/dL
SPECIFIC GRAVITY, URINE: 1.009 (ref 1.005–1.030)
pH: 6 (ref 5.0–8.0)

## 2016-09-06 LAB — PROTEIN / CREATININE RATIO, URINE
Creatinine, Urine: 252.3 mg/dL
Creatinine, Urine: 77 mg/dL
PROTEIN CREATININE RATIO: 0.26 mg/mg{creat} — AB (ref 0.00–0.15)
PROTEIN/CREAT RATIO: 136 mg/g{creat} (ref 0–200)
Protein, Ur: 34.2 mg/dL
TOTAL PROTEIN, URINE: 20 mg/dL

## 2016-09-06 LAB — AST: AST: 20 IU/L (ref 0–40)

## 2016-09-06 LAB — CBC
HEMATOCRIT: 32.1 % — AB (ref 36.0–46.0)
HEMOGLOBIN: 10.7 g/dL — AB (ref 11.1–15.9)
HEMOGLOBIN: 10.9 g/dL — AB (ref 12.0–15.0)
Hematocrit: 32.5 % — ABNORMAL LOW (ref 34.0–46.6)
MCH: 34.6 pg — ABNORMAL HIGH (ref 26.6–33.0)
MCH: 35.3 pg — ABNORMAL HIGH (ref 26.0–34.0)
MCHC: 32.9 g/dL (ref 31.5–35.7)
MCHC: 34 g/dL (ref 30.0–36.0)
MCV: 105 fL — ABNORMAL HIGH (ref 79–97)
MCV: 103.9 fL — ABNORMAL HIGH (ref 78.0–100.0)
PLATELETS: 250 10*3/uL (ref 150–379)
Platelets: 245 10*3/uL (ref 150–400)
RBC: 3.09 x10E6/uL — ABNORMAL LOW (ref 3.77–5.28)
RBC: 3.09 MIL/uL — ABNORMAL LOW (ref 3.87–5.11)
RDW: 13.3 % (ref 12.3–15.4)
RDW: 13 % (ref 11.5–15.5)
WBC: 8.9 10*3/uL (ref 3.4–10.8)
WBC: 10.2 10*3/uL (ref 4.0–10.5)

## 2016-09-06 LAB — ALT: ALT: 16 IU/L (ref 0–32)

## 2016-09-06 LAB — HIV ANTIBODY (ROUTINE TESTING W REFLEX): HIV SCREEN 4TH GENERATION: NONREACTIVE

## 2016-09-06 LAB — GLUCOSE TOLERANCE, 2 HOURS W/ 1HR
Glucose, 1 hour: 63 mg/dL — ABNORMAL LOW (ref 65–179)
Glucose, 2 hour: 69 mg/dL (ref 65–152)
Glucose, Fasting: 67 mg/dL (ref 65–91)

## 2016-09-06 LAB — RPR: RPR: NONREACTIVE

## 2016-09-06 LAB — LACTATE DEHYDROGENASE: LDH: 224 IU/L (ref 119–226)

## 2016-09-06 MED ORDER — HYDRALAZINE HCL 20 MG/ML IJ SOLN: 10.0000 mg | Freq: Once | INTRAMUSCULAR | Status: DC | PRN

## 2016-09-06 MED ORDER — LABETALOL HCL 5 MG/ML IV SOLN: 20.0000 mg | INTRAVENOUS | Status: DC | PRN

## 2016-09-06 MED ORDER — BETAMETHASONE SOD PHOS & ACET 6 (3-3) MG/ML IJ SUSP
12.0000 mg | Freq: Once | INTRAMUSCULAR | Status: AC
  Administered 2016-09-06: 12 mg via INTRAMUSCULAR
  Filled 2016-09-06: qty 2

## 2016-09-06 MED ORDER — ASPIRIN 81 MG PO CHEW: 81.0000 mg | CHEWABLE_TABLET | Freq: Every day | ORAL | 3 refills | Status: DC

## 2016-09-06 NOTE — MAU Note (Signed)
Pt. Present with high blood pressure, took BP at home and was advised by the telephone triage nurse for [facility] to come to [facility] hospital to be triaged.  Pt. c/o occasional blurred vision, "seeing spots" during the day, intermittently.  Pt. Denies headache. Denies sudden gush of fluid, no vaginal discharge/bleeding, positive for fetal movement. EFM applied - FHR 140s, Toco applied - abd. Soft.

## 2016-09-06 NOTE — MAU Provider Note (Signed)
History     CSN: [id number]  Arrival date and time: 09/06/16 4098   First Provider Initiated Contact with Patient 09/06/16 2035      Chief Complaint  Patient presents with  . Hypertension   Jennifer Peters is a 26 y.o. [phone] at [redacted]w[redacted]d who presents today with high blood pressure. She was seen in the office yesterday and her blood pressure was 130/90. She had labs done. Patient is a [facility]cripts patient, and she took her blood pressure again today. She states that at home it was 144/100. She denies any HA or RUQ pain. She states that for about one week she has had "been seeing some spots randomly during the day".    Hypertension  This is a new problem. The current episode started yesterday. The problem is unchanged. Associated agents: pregnancy  Past treatments include nothing. There are no compliance problems.    Past Medical History:  Diagnosis Date  . Abnormal Pap smear of cervix    unknown stage of abnormality  . Anemia   . Hx of chlamydia infection   . Post partum depression     Past Surgical History:  Procedure Laterality Date  . DILATION AND CURETTAGE OF UTERUS    . LAPAROSCOPIC APPENDECTOMY  06/14/2012   Procedure: APPENDECTOMY LAPAROSCOPIC;  Surgeon: Clovis Pu. Cornett, MD;  Location: [facility] OR;  Service: General;  Laterality: N/A;  . wisdom tooth extracted x1 in 04/2011      Family History  Problem Relation Age of Onset  . Kidney disease Maternal Grandmother   . Heart disease Maternal Grandmother     chf  . Diabetes Maternal Aunt   . Hypertension Mother   . Other Neg Hx     Social History  Substance Use Topics  . Smoking status: Former Smoker    Packs/day: 0.50    Years: 3.00    Quit date: 08/21/2011  . Smokeless tobacco: Never Used  . Alcohol use No    Allergies:  Allergies  Allergen Reactions  . Latex Itching and Rash    Prescriptions Prior to Admission  Medication Sig Dispense Refill Last Dose  . acetaminophen (TYLENOL) 325 MG tablet Take 650 mg by  mouth every 6 (six) hours as needed.   Taking  . Elastic Bandages & Supports (WRIST BRACE/RIGHT MEDIUM) MISC 1 Device by Does not apply route daily. 1 each 0 Taking  . Prenatal Vit-Fe Phos-FA-Omega (VITAFOL GUMMIES) 3.33-0.333-34.8 MG CHEW Chew 3 tablets by mouth at bedtime. 90 tablet 12 Taking    Review of Systems Physical Exam   Blood pressure (!) 140/100, pulse (!) 59, temperature 98.6 F (37 C), resp. rate 17, height 5' 1.5" (1.562 m), weight 160 lb (72.6 kg), last menstrual period 02/29/2016, SpO2 100 %.  Physical Exam  Nursing note and vitals reviewed. Constitutional: She is oriented to person, place, and time. She appears well-developed and well-nourished. No distress.  HENT:  Head: Normocephalic.  Cardiovascular: Normal rate.   Respiratory: Effort normal.  GI: Soft. There is no tenderness. There is no rebound.  Neurological: She is alert and oriented to person, place, and time. She has normal reflexes.  No clonus   Skin: Skin is warm and dry.  Psychiatric: She has a normal mood and affect.   Results for orders placed or performed during the hospital encounter of 09/06/16 (from the past 24 hour(s))  Urinalysis, Routine w reflex microscopic     Status: Abnormal   Collection Time: 09/06/16  7:51 PM  Result Value Ref  Range   Color, Urine YELLOW YELLOW   APPearance HAZY (A) CLEAR   Specific Gravity, Urine 1.009 1.005 - 1.030   pH 6.0 5.0 - 8.0   Glucose, UA NEGATIVE NEGATIVE mg/dL   Hgb urine dipstick NEGATIVE NEGATIVE   Bilirubin Urine NEGATIVE NEGATIVE   Ketones, ur NEGATIVE NEGATIVE mg/dL   Protein, ur NEGATIVE NEGATIVE mg/dL   Nitrite NEGATIVE NEGATIVE   Leukocytes, UA MODERATE (A) NEGATIVE   RBC / HPF 0-5 0 - 5 RBC/hpf   WBC, UA 6-30 0 - 5 WBC/hpf   Bacteria, UA RARE (A) NONE SEEN   Squamous Epithelial / LPF 6-30 (A) NONE SEEN   Mucous PRESENT   Protein / creatinine ratio, urine     Status: Abnormal   Collection Time: 09/06/16  7:51 PM  Result Value Ref Range    Creatinine, Urine 77.00 mg/dL   Total Protein, Urine 20 mg/dL   Protein Creatinine Ratio 0.26 (H) 0.00 - 0.15 mg/mg[Cre]  Comprehensive metabolic panel     Status: Abnormal   Collection Time: 09/06/16  8:40 PM  Result Value Ref Range   Sodium 135 135 - 145 mmol/L   Potassium 3.7 3.5 - 5.1 mmol/L   Chloride 106 101 - 111 mmol/L   CO2 22 22 - 32 mmol/L   Glucose, Bld 84 65 - 99 mg/dL   BUN 8 6 - 20 mg/dL   Creatinine, Ser 1.61 0.44 - 1.00 mg/dL   Calcium 8.7 (L) 8.9 - 10.3 mg/dL   Total Protein 6.5 6.5 - 8.1 g/dL   Albumin 3.2 (L) 3.5 - 5.0 g/dL   AST 20 15 - 41 U/L   ALT 17 14 - 54 U/L   Alkaline Phosphatase 67 38 - 126 U/L   Total Bilirubin 0.6 0.3 - 1.2 mg/dL   GFR calc non Af Amer >60 >60 mL/min   GFR calc Af Amer >60 >60 mL/min   Anion gap 7 5 - 15  CBC     Status: Abnormal   Collection Time: 09/06/16  8:40 PM  Result Value Ref Range   WBC 10.2 4.0 - 10.5 K/uL   RBC 3.09 (L) 3.87 - 5.11 MIL/uL   Hemoglobin 10.9 (L) 12.0 - 15.0 g/dL   HCT 09.6 (L) 04.5 - 40.9 %   MCV 103.9 (H) 78.0 - 100.0 fL   MCH 35.3 (H) 26.0 - 34.0 pg   MCHC 34.0 30.0 - 36.0 g/dL   RDW 81.1 91.4 - 78.2 %   Platelets 245 150 - 400 K/uL   FHT: 150, moderate with 10x10 accels, no decels Toco: no UCs  MAU Course  Procedures  MDM 2052: D/W Dr. Erin Fulling, reviewed vitals and labs from yesterday and today. Will start patient on ASA and give BMZ today. Patient to FU in the morning for blood pressure check as planned and saturday morning for second betamethasone.  Assessment and Plan   1. Gestational hypertension, third trimester   2. Supervision of other normal pregnancy, antepartum   3. [redacted] weeks gestation of pregnancy    DC home Comfort measures reviewed  3rd Trimester precautions  PTL precautions  Fetal kick counts RX: ASA  QD #30 with 3 RF  Return to MAU as needed FU with OB tomorrow for B/P check Return to MAU Saturday for second BMZ injection    Tawnya Crook 09/06/2016, 8:41 PM

## 2016-09-06 NOTE — Telephone Encounter (Signed)
Attempt to contact pt regarding FMLA papers.  No answer, VM full.  Need to know from pt if she is requesting intermittent FMLA or if it is for her postpartum period.  If for postpartum,  Need dates that she want to go out and return to work.

## 2016-09-06 NOTE — MAU Note (Signed)
Pt states that she took her BP for [facility] and states her BP was 144/100. States she occasionally has some black spots in vision, but denies HA, RUQ pain, n/v. States occasional BH contractions. Denies vag bleeding or LOF. +FM.

## 2016-09-06 NOTE — Discharge Instructions (Signed)

## 2016-09-07 ENCOUNTER — Other Ambulatory Visit: Payer: Self-pay | Admitting: Certified Nurse Midwife

## 2016-09-07 ENCOUNTER — Ambulatory Visit: Payer: Medicaid Other

## 2016-09-07 VITALS — BP 128/91 | HR 83

## 2016-09-07 DIAGNOSIS — O139 Gestational [pregnancy-induced] hypertension without significant proteinuria, unspecified trimester: Secondary | ICD-10-CM

## 2016-09-07 DIAGNOSIS — Z0130 Encounter for examination of blood pressure without abnormal findings: Secondary | ICD-10-CM

## 2016-09-07 MED ORDER — LABETALOL HCL 200 MG PO TABS
200.0000 mg | ORAL_TABLET | Freq: Two times a day (BID) | ORAL | 3 refills | Status: DC
Start: 2016-09-07 — End: 2016-09-28

## 2016-09-07 NOTE — Progress Notes (Signed)
Patient is in the office for BP check, pt states she has not taken prescribed aspirin today, provider reviewed BP results and sent additional rx. Patient denied HA, dizziness, and spots. Patient advised to follow up with upcoming scheduled appts.

## 2016-09-08 ENCOUNTER — Inpatient Hospital Stay (HOSPITAL_COMMUNITY)
Admission: AD | Admit: 2016-09-08 | Discharge: 2016-09-08 | Disposition: A | Discharge status: Home / Self Care | Payer: Medicaid Other | Source: Ambulatory Visit | Attending: Obstetrics and Gynecology | Admitting: Obstetrics and Gynecology

## 2016-09-08 DIAGNOSIS — Z3A28 28 weeks gestation of pregnancy: Secondary | ICD-10-CM | POA: Diagnosis not present

## 2016-09-08 MED ORDER — BETAMETHASONE SOD PHOS & ACET 6 (3-3) MG/ML IJ SUSP
12.0000 mg | Freq: Once | INTRAMUSCULAR | Status: AC
  Administered 2016-09-08: 12 mg via INTRAMUSCULAR
  Filled 2016-09-08: qty 2

## 2016-09-09 ENCOUNTER — Inpatient Hospital Stay (HOSPITAL_COMMUNITY)
Admission: AD | Admit: 2016-09-09 | Discharge: 2016-09-09 | Disposition: A | Discharge status: Home / Self Care | Payer: Medicaid Other | Source: Ambulatory Visit | Attending: Obstetrics & Gynecology | Admitting: Obstetrics & Gynecology

## 2016-09-09 ENCOUNTER — Encounter (HOSPITAL_COMMUNITY): Payer: Self-pay | Admitting: *Deleted

## 2016-09-09 DIAGNOSIS — Z87891 Personal history of nicotine dependence: Secondary | ICD-10-CM | POA: Diagnosis not present

## 2016-09-09 DIAGNOSIS — Z3A28 28 weeks gestation of pregnancy: Secondary | ICD-10-CM | POA: Diagnosis not present

## 2016-09-09 DIAGNOSIS — O368131 Decreased fetal movements, third trimester, fetus 1: Secondary | ICD-10-CM

## 2016-09-09 DIAGNOSIS — Z3480 Encounter for supervision of other normal pregnancy, unspecified trimester: Secondary | ICD-10-CM

## 2016-09-09 DIAGNOSIS — O368130 Decreased fetal movements, third trimester, not applicable or unspecified: Secondary | ICD-10-CM | POA: Insufficient documentation

## 2016-09-09 DIAGNOSIS — Z3689 Encounter for other specified antenatal screening: Secondary | ICD-10-CM

## 2016-09-09 NOTE — Discharge Instructions (Signed)
Hypertension During Pregnancy °Hypertension, commonly called high blood pressure, is when the force of blood pumping through your arteries is too strong. Arteries are blood vessels that carry blood from the heart throughout the body. Hypertension during pregnancy can cause problems for you and your baby. Your baby may be born early (prematurely) or may not weigh as much as he or she should at birth. Very bad cases of hypertension during pregnancy can be life-threatening. °Different types of hypertension can occur during pregnancy. These include: °· Chronic hypertension. This happens when: °¨ You have hypertension before pregnancy and it continues during pregnancy. °¨ You develop hypertension before you are [redacted] weeks pregnant, and it continues during pregnancy. °· Gestational hypertension. This is hypertension that develops after the 20th week of pregnancy. °· Preeclampsia, also called toxemia of pregnancy. This is a very serious type of hypertension that develops only during pregnancy. It affects the whole body, and it can be very dangerous for you and your baby. °Gestational hypertension and preeclampsia usually go away within 6 weeks after your baby is born. Women who have hypertension during pregnancy have a greater chance of developing hypertension later in life or during future pregnancies. °What are the causes? °The exact cause of hypertension is not known. °What increases the risk? °There are certain factors that make it more likely for you to develop hypertension during pregnancy. These include: °· Having hypertension during a previous pregnancy or prior to pregnancy. °· Being overweight. °· Being older than age 40. °· Being pregnant for the first time or being pregnant with more than one baby. °· Becoming pregnant using fertilization methods such as IVF (in vitro fertilization). °· Having diabetes, kidney problems, or systemic lupus erythematosus. °· Having a family history of hypertension. °What are the  signs or symptoms? °Chronic hypertension and gestational hypertension rarely cause symptoms. Preeclampsia causes symptoms, which may include: °· Increased protein in your urine. Your health care provider will check for this at every visit before you give birth (prenatal visit). °· Severe headaches. °· Sudden weight gain. °· Swelling of the hands, face, legs, and feet. °· Nausea and vomiting. °· Vision problems, such as blurred or double vision. °· Numbness in the face, arms, legs, and feet. °· Dizziness. °· Slurred speech. °· Sensitivity to bright lights. °· Abdominal pain. °· Convulsions. °How is this diagnosed? °You may be diagnosed with hypertension during a routine prenatal exam. At each prenatal visit, you may: °· Have a urine test to check for high amounts of protein in your urine. °· Have your blood pressure checked. A blood pressure reading is recorded as two numbers, such as "120 over 80" (or 120/80). The first ("top") number is called the systolic pressure. It is a measure of the pressure in your arteries when your heart beats. The second ("bottom") number is called the diastolic pressure. It is a measure of the pressure in your arteries as your heart relaxes between beats. Blood pressure is measured in a unit called mm Hg. A normal blood pressure reading is: °¨ Systolic: below 120. °¨ Diastolic: below 80. °The type of hypertension that you are diagnosed with depends on your test results and when your symptoms developed. °· Chronic hypertension is usually diagnosed before 20 weeks of pregnancy. °· Gestational hypertension is usually diagnosed after 20 weeks of pregnancy. °· Hypertension with high amounts of protein in the urine is diagnosed as preeclampsia. °· Blood pressure measurements that stay above 160 systolic, or above 110 diastolic, are signs of severe preeclampsia. °  How is this treated? °Treatment for hypertension during pregnancy varies depending on the type of hypertension you have and how  serious it is. °· If you take medicines called ACE inhibitors to treat chronic hypertension, you may need to switch medicines. ACE inhibitors should not be taken during pregnancy. °· If you have gestational hypertension, you may need to take blood pressure medicine. °· If you are at risk for preeclampsia, your health care provider may recommend that you take a low-dose aspirin every day to prevent high blood pressure during your pregnancy. °· If you have severe preeclampsia, you may need to be hospitalized so you and your baby can be monitored closely. You may also need to take medicine (magnesium sulfate) to prevent seizures and to lower blood pressure. This medicine may be given as an injection or through an IV tube. °· In some cases, if your condition gets worse, you may need to deliver your baby early. °Follow these instructions at home: °Eating and drinking °· Drink enough fluid to keep your urine clear or pale yellow. °· Eat a healthy diet that is low in salt (sodium). Do not add salt to your food. Check food labels to see how much sodium a food or beverage contains. °Lifestyle °· Do not use any products that contain nicotine or tobacco, such as cigarettes and e-cigarettes. If you need help quitting, ask your health care provider. °· Do not use alcohol. °· Avoid caffeine. °· Avoid stress as much as possible. Rest and get plenty of sleep. °General instructions °· Take over-the-counter and prescription medicines only as told by your health care provider. °· While lying down, lie on your left side. This keeps pressure off your baby. °· While sitting or lying down, raise (elevate) your feet. Try putting some pillows under your lower legs. °· Exercise regularly. Ask your health care provider what kinds of exercise are best for you. °· Keep all prenatal and follow-up visits as told by your health care provider. This is important. °Contact a health care provider if: °· You have symptoms that your health care provider  told you may require more treatment or monitoring, such as: °¨ Fever. °¨ Vomiting. °¨ Headache. °Get help right away if: °· You have severe abdominal pain or vomiting that does not get better with treatment. °· You suddenly develop swelling in your hands, ankles, or face. °· You gain 4 lbs (1.8 kg) or more in 1 week. °· You develop vaginal bleeding, or you have blood in your urine. °· You do not feel your baby moving as much as usual. °· You have blurred or double vision. °· You have muscle twitching or sudden tightening (spasms). °· You have shortness of breath. °· Your lips or fingernails turn blue. °This information is not intended to replace advice given to you by your health care provider. Make sure you discuss any questions you have with your health care provider. °Document Released: 02/06/2011 Document Revised: 12/09/2015 Document Reviewed: 11/04/2015 °Elsevier Interactive Patient Education © 2017 [employer] ° °

## 2016-09-09 NOTE — MAU Provider Note (Signed)
  History    Patient Jennifer Peters is a 26 year old G4P2012 at 28 weeks and 3 days here with complaints of decreased fetal movements today.  Patient is here to get checked out; she has started her labetalol.  CSN: [id number]  Arrival date and time: 09/09/16 1253   First Provider Initiated Contact with Patient 09/09/16 1343      Chief Complaint  Patient presents with  . Decreased Fetal Movement   HPI  OB History    Gravida Para Term Preterm AB Living   0 1 2   SAB TAB Ectopic Multiple Live Births   1 0 0 0 2      Past Medical History:  Diagnosis Date  . Abnormal Pap smear of cervix    unknown stage of abnormality  . Anemia   . Hx of chlamydia infection   . Post partum depression     Past Surgical History:  Procedure Laterality Date  . DILATION AND CURETTAGE OF UTERUS    . LAPAROSCOPIC APPENDECTOMY  06/14/2012   Procedure: APPENDECTOMY LAPAROSCOPIC;  Surgeon: Clovis Pu. Cornett, MD;  Location: [facility] OR;  Service: General;  Laterality: N/A;  . wisdom tooth extracted x1 in 04/2011      Family History  Problem Relation Age of Onset  . Kidney disease Maternal Grandmother   . Heart disease Maternal Grandmother     chf  . Diabetes Maternal Aunt   . Hypertension Mother   . Other Neg Hx     Social History  Substance Use Topics  . Smoking status: Former Smoker    Packs/day: 0.50    Years: 3.00    Quit date: 08/21/2011  . Smokeless tobacco: Never Used  . Alcohol use No    Allergies:  Allergies  Allergen Reactions  . Latex Itching and Rash    No prescriptions prior to admission.    Review of Systems  Respiratory: Negative.   Gastrointestinal: Negative.   Genitourinary: Negative.   Musculoskeletal: Negative.   Psychiatric/Behavioral: Negative.    Physical Exam   Blood pressure 131/87, pulse 80, temperature 98.2 F (36.8 C), temperature source Oral, resp. rate 18, height  (1.549 m), weight 72.6 kg (160 lb), last menstrual period  02/29/2016.  Physical Exam  Constitutional: She is oriented to person, place, and time. She appears well-developed and well-nourished.  HENT:  Head: Normocephalic.  Neck: Normal range of motion.  Respiratory: Effort normal.  GI: Soft.  Musculoskeletal: Normal range of motion.  Neurological: She is alert and oriented to person, place, and time. She has normal reflexes.  Skin: Skin is warm and dry.    MAU Course  Procedures  MDM NST: 150 bpm, 10x 10 acels; no decels, no contractions, moderate variability.  Patient has felt the baby move since being in MAU.   Assessment and Plan  1. NST: Reactive 2. Patient stable for discharge; reviewed fetal movements for this gestation ( 28 weeks), encouraged patient to return to MAU with any concerns, pain, bleeding, leaking of fluid or further decreased fetal movements.   Charlesetta Garibaldi Adolphus Hanf CNM 09/09/2016, 8:53 PM

## 2016-09-09 NOTE — MAU Note (Signed)
Presents to MAU after not feeling baby move as much today.  Had BMZ x2 4/5 and 4/6.  Started on BP medication and since starting the medications starting to feel nauseous.  Denies LOF, vaginal bleeding

## 2016-09-10 ENCOUNTER — Other Ambulatory Visit: Payer: Medicaid Other | Admitting: *Deleted

## 2016-09-10 VITALS — BP 126/78

## 2016-09-10 DIAGNOSIS — Z3493 Encounter for supervision of normal pregnancy, unspecified, third trimester: Secondary | ICD-10-CM

## 2016-09-10 DIAGNOSIS — O162 Unspecified maternal hypertension, second trimester: Secondary | ICD-10-CM

## 2016-09-10 LAB — PATHOLOGIST SMEAR REVIEW
BASOS ABS: 0 10*3/uL (ref 0.0–0.2)
Basos: 0 %
EOS (ABSOLUTE): 0.1 10*3/uL (ref 0.0–0.4)
Eos: 2 %
HEMATOCRIT: 33 % — AB (ref 34.0–46.6)
HEMOGLOBIN: 11.1 g/dL (ref 11.1–15.9)
Immature Grans (Abs): 0.1 10*3/uL (ref 0.0–0.1)
Immature Granulocytes: 1 %
LYMPHS ABS: 2.4 10*3/uL (ref 0.7–3.1)
Lymphs: 26 %
MCH: 35.9 pg — AB (ref 26.6–33.0)
MCHC: 33.6 g/dL (ref 31.5–35.7)
MCV: 107 fL — ABNORMAL HIGH (ref 79–97)
MONOS ABS: 1 10*3/uL — AB (ref 0.1–0.9)
Monocytes: 12 %
NEUTROS ABS: 5.4 10*3/uL (ref 1.4–7.0)
Neutrophils: 59 %
PATH REV WBC: NORMAL
PLATELETS: 255 10*3/uL (ref 150–379)
Path Rev PLTs: NORMAL
RBC: 3.09 x10E6/uL — AB (ref 3.77–5.28)
RDW: 13.1 % (ref 12.3–15.4)
WBC: 9 10*3/uL (ref 3.4–10.8)

## 2016-09-10 NOTE — Progress Notes (Signed)
Pt is in office to leave 24 hour urine sample. Pt states that she has had some increased BP at home. Pt complaints of right side back and side pain.  Pt made aware UC can be sent, could be some sciatic pain. Advised she may take tylenol as needed and use comfort measures.  UC sent today. Pt advised to drink plenty of fluids/water, continue to monitor BP-make office aware of any changes, HA, vision changes. BP on office today is WNL. Pt advised to keep next appt as scheduled.  Pt made aware she should receive call if any abnormal labs.  Pt states understanding.

## 2016-09-11 ENCOUNTER — Other Ambulatory Visit: Payer: Self-pay | Admitting: Certified Nurse Midwife

## 2016-09-11 ENCOUNTER — Inpatient Hospital Stay (HOSPITAL_COMMUNITY): Payer: Medicaid Other

## 2016-09-11 ENCOUNTER — Encounter (HOSPITAL_COMMUNITY): Payer: Self-pay

## 2016-09-11 ENCOUNTER — Inpatient Hospital Stay (HOSPITAL_COMMUNITY)
Admission: AD | Admit: 2016-09-11 | Discharge: 2016-09-28 | DRG: 766 | Disposition: A | Discharge status: Home / Self Care | Payer: Medicaid Other | Source: Ambulatory Visit | Attending: Family Medicine | Admitting: Family Medicine

## 2016-09-11 DIAGNOSIS — O9902 Anemia complicating childbirth: Secondary | ICD-10-CM | POA: Diagnosis present

## 2016-09-11 DIAGNOSIS — Z87891 Personal history of nicotine dependence: Secondary | ICD-10-CM

## 2016-09-11 DIAGNOSIS — R740 Nonspecific elevation of levels of transaminase and lactic acid dehydrogenase [LDH]: Secondary | ICD-10-CM

## 2016-09-11 DIAGNOSIS — O1494 Unspecified pre-eclampsia, complicating childbirth: Secondary | ICD-10-CM | POA: Diagnosis present

## 2016-09-11 DIAGNOSIS — R7401 Elevation of levels of liver transaminase levels: Secondary | ICD-10-CM | POA: Diagnosis not present

## 2016-09-11 DIAGNOSIS — Z3A30 30 weeks gestation of pregnancy: Secondary | ICD-10-CM

## 2016-09-11 DIAGNOSIS — O1410 Severe pre-eclampsia, unspecified trimester: Secondary | ICD-10-CM

## 2016-09-11 DIAGNOSIS — O365990 Maternal care for other known or suspected poor fetal growth, unspecified trimester, not applicable or unspecified: Secondary | ICD-10-CM | POA: Diagnosis not present

## 2016-09-11 DIAGNOSIS — Z8249 Family history of ischemic heart disease and other diseases of the circulatory system: Secondary | ICD-10-CM | POA: Diagnosis not present

## 2016-09-11 DIAGNOSIS — Z91040 Latex allergy status: Secondary | ICD-10-CM | POA: Diagnosis not present

## 2016-09-11 DIAGNOSIS — Z3A29 29 weeks gestation of pregnancy: Secondary | ICD-10-CM

## 2016-09-11 DIAGNOSIS — O283 Abnormal ultrasonic finding on antenatal screening of mother: Secondary | ICD-10-CM

## 2016-09-11 DIAGNOSIS — D531 Other megaloblastic anemias, not elsewhere classified: Secondary | ICD-10-CM | POA: Diagnosis present

## 2016-09-11 DIAGNOSIS — Z833 Family history of diabetes mellitus: Secondary | ICD-10-CM

## 2016-09-11 DIAGNOSIS — O1493 Unspecified pre-eclampsia, third trimester: Secondary | ICD-10-CM

## 2016-09-11 DIAGNOSIS — O1413 Severe pre-eclampsia, third trimester: Secondary | ICD-10-CM

## 2016-09-11 DIAGNOSIS — O288 Other abnormal findings on antenatal screening of mother: Secondary | ICD-10-CM

## 2016-09-11 DIAGNOSIS — O0993 Supervision of high risk pregnancy, unspecified, third trimester: Secondary | ICD-10-CM | POA: Diagnosis not present

## 2016-09-11 DIAGNOSIS — O1490 Unspecified pre-eclampsia, unspecified trimester: Secondary | ICD-10-CM

## 2016-09-11 DIAGNOSIS — O1414 Severe pre-eclampsia complicating childbirth: Secondary | ICD-10-CM | POA: Diagnosis present

## 2016-09-11 DIAGNOSIS — Z3480 Encounter for supervision of other normal pregnancy, unspecified trimester: Secondary | ICD-10-CM

## 2016-09-11 DIAGNOSIS — Z3A28 28 weeks gestation of pregnancy: Secondary | ICD-10-CM | POA: Diagnosis not present

## 2016-09-11 HISTORY — DX: Other specified abnormal findings of blood chemistry: R79.89

## 2016-09-11 LAB — URINALYSIS, ROUTINE W REFLEX MICROSCOPIC
BILIRUBIN URINE: NEGATIVE
GLUCOSE, UA: NEGATIVE mg/dL
KETONES UR: NEGATIVE mg/dL
Nitrite: NEGATIVE
PH: 7 (ref 5.0–8.0)
Protein, ur: 100 mg/dL — AB
Specific Gravity, Urine: 1.011 (ref 1.005–1.030)

## 2016-09-11 LAB — CBC
HCT: 31.4 % — ABNORMAL LOW (ref 36.0–46.0)
Hemoglobin: 10.6 g/dL — ABNORMAL LOW (ref 12.0–15.0)
MCH: 35.3 pg — AB (ref 26.0–34.0)
MCHC: 33.8 g/dL (ref 30.0–36.0)
MCV: 104.7 fL — ABNORMAL HIGH (ref 78.0–100.0)
Platelets: 203 10*3/uL (ref 150–400)
RBC: 3 MIL/uL — ABNORMAL LOW (ref 3.87–5.11)
RDW: 13.2 % (ref 11.5–15.5)
WBC: 13.5 10*3/uL — ABNORMAL HIGH (ref 4.0–10.5)

## 2016-09-11 LAB — TYPE AND SCREEN
ABO/RH(D): A POS
ANTIBODY SCREEN: NEGATIVE

## 2016-09-11 LAB — COMPREHENSIVE METABOLIC PANEL
ALBUMIN: 2.9 g/dL — AB (ref 3.5–5.0)
ALK PHOS: 65 U/L (ref 38–126)
ALT: 52 U/L (ref 14–54)
ANION GAP: 9 (ref 5–15)
AST: 67 U/L — ABNORMAL HIGH (ref 15–41)
BILIRUBIN TOTAL: 0.8 mg/dL (ref 0.3–1.2)
BUN: 9 mg/dL (ref 6–20)
CALCIUM: 8.2 mg/dL — AB (ref 8.9–10.3)
CO2: 23 mmol/L (ref 22–32)
Chloride: 106 mmol/L (ref 101–111)
Creatinine, Ser: 0.61 mg/dL (ref 0.44–1.00)
GFR calc non Af Amer: >60 mL/min (ref >60)
Glucose, Bld: 75 mg/dL (ref 65–99)
POTASSIUM: 3.6 mmol/L (ref 3.5–5.1)
SODIUM: 138 mmol/L (ref 135–145)
TOTAL PROTEIN: 6.1 g/dL — AB (ref 6.5–8.1)

## 2016-09-11 LAB — PROTEIN / CREATININE RATIO, URINE
Creatinine, Urine: 89 mg/dL
PROTEIN CREATININE RATIO: 1.26 mg/mg{creat} — AB (ref 0.00–0.15)
TOTAL PROTEIN, URINE: 112 mg/dL

## 2016-09-11 LAB — PROTEIN, URINE, 24 HOUR
PROTEIN UR: 36.5 mg/dL
Protein, 24H Urine: 329 mg/24 hr — ABNORMAL HIGH (ref 30–150)

## 2016-09-11 LAB — CREATININE CLEARANCE, URINE, 24 HOUR
CREATININE, UR: 90.5 mg/dL
Creatinine Clearance: 74 mL/min — ABNORMAL LOW (ref 88–128)
Creatinine, 24H Ur: 815 mg/24 hr (ref 800–1800)
Creatinine, Ser: 0.76 mg/dL (ref 0.57–1.00)
GFR calc non Af Amer: 108 mL/min/{1.73_m2} (ref >59)
GFR, EST AFRICAN AMERICAN: 124 mL/min/{1.73_m2} (ref >59)

## 2016-09-11 LAB — ABO/RH: ABO/RH(D): A POS

## 2016-09-11 MED ORDER — CALCIUM CARBONATE ANTACID 500 MG PO CHEW: 2.0000 | CHEWABLE_TABLET | ORAL | Status: DC | PRN

## 2016-09-11 MED ORDER — LABETALOL HCL 200 MG PO TABS
200.0000 mg | ORAL_TABLET | Freq: Three times a day (TID) | ORAL | Status: DC
  Administered 2016-09-11 – 2016-09-13 (×8): 200 mg via ORAL
  Filled 2016-09-11 (×5): qty 1
  Filled 2016-09-11: qty 2
  Filled 2016-09-11 (×2): qty 1

## 2016-09-11 MED ORDER — LACTATED RINGERS IV SOLN
INTRAVENOUS | Status: DC
  Administered 2016-09-11 – 2016-09-13 (×5): via INTRAVENOUS

## 2016-09-11 MED ORDER — MORPHINE SULFATE (PF) 4 MG/ML IV SOLN
4.0000 mg | Freq: Once | INTRAVENOUS | Status: AC
  Administered 2016-09-11: 4 mg via INTRAVENOUS
  Filled 2016-09-11: qty 1

## 2016-09-11 MED ORDER — ACETAMINOPHEN 325 MG PO TABS
650.0000 mg | ORAL_TABLET | ORAL | Status: DC | PRN
  Administered 2016-09-11 – 2016-09-23 (×11): 650 mg via ORAL
  Filled 2016-09-11 (×11): qty 2

## 2016-09-11 MED ORDER — HYDRALAZINE HCL 20 MG/ML IJ SOLN
10.0000 mg | Freq: Once | INTRAMUSCULAR | Status: AC | PRN
  Administered 2016-09-14: 10 mg via INTRAVENOUS
  Filled 2016-09-11: qty 1

## 2016-09-11 MED ORDER — WRIST BRACE/RIGHT MEDIUM MISC: 1.0000 | Freq: Every day | Status: DC

## 2016-09-11 MED ORDER — ASPIRIN 81 MG PO CHEW
81.0000 mg | CHEWABLE_TABLET | Freq: Every day | ORAL | Status: DC
  Administered 2016-09-11 – 2016-09-24 (×14): 81 mg via ORAL
  Filled 2016-09-11 (×18): qty 1

## 2016-09-11 MED ORDER — MAGNESIUM SULFATE 40 G IN LACTATED RINGERS - SIMPLE
2.0000 g/h | INTRAVENOUS | Status: DC
  Administered 2016-09-11 – 2016-09-12 (×2): 2 g/h via INTRAVENOUS
  Filled 2016-09-11 (×2): qty 500

## 2016-09-11 MED ORDER — ZOLPIDEM TARTRATE 5 MG PO TABS
5.0000 mg | ORAL_TABLET | Freq: Every evening | ORAL | Status: DC | PRN
  Administered 2016-09-22 – 2016-09-23 (×2): 5 mg via ORAL
  Filled 2016-09-11 (×2): qty 1

## 2016-09-11 MED ORDER — PRENATAL MULTIVITAMIN CH
1.0000 | ORAL_TABLET | Freq: Every day | ORAL | Status: DC
Start: 2016-09-11 — End: 2016-09-25
  Administered 2016-09-11 – 2016-09-24 (×13): 1 via ORAL
  Filled 2016-09-11 (×14): qty 1

## 2016-09-11 MED ORDER — LABETALOL HCL 5 MG/ML IV SOLN
20.0000 mg | INTRAVENOUS | Status: AC | PRN
  Administered 2016-09-11: 40 mg via INTRAVENOUS
  Administered 2016-09-11: 80 mg via INTRAVENOUS
  Administered 2016-09-11: 20 mg via INTRAVENOUS
  Filled 2016-09-11: qty 16
  Filled 2016-09-11: qty 4
  Filled 2016-09-11: qty 8

## 2016-09-11 MED ORDER — MAGNESIUM SULFATE BOLUS VIA INFUSION
4.0000 g | Freq: Once | INTRAVENOUS | Status: AC
  Administered 2016-09-11: 4 g via INTRAVENOUS
  Filled 2016-09-11: qty 500

## 2016-09-11 MED ORDER — DOCUSATE SODIUM 100 MG PO CAPS
100.0000 mg | ORAL_CAPSULE | Freq: Every day | ORAL | Status: DC
  Administered 2016-09-11 – 2016-09-24 (×14): 100 mg via ORAL
  Filled 2016-09-11 (×16): qty 1

## 2016-09-11 NOTE — H&P (Signed)
Jennifer Peters is a 26 y.o. female [phone] @ [redacted]w[redacted]d pt of [facility] is admitted for preeclampsia with severe features before 34 weeks.  She presented to MAU today with RUQ pain since yesterday. She has been followed for high blood pressure since 09/05/16. She was seen in the office that day with new onset hypertension, and presented on 09/06/16 to MAU.  P/C ration on 4/5 was 0.26.  She was given BMZ on 4/5 and 4/7 and started on PO labetalol 200 mg BID.  She has not taken a dose today. She denies any VB, LOF, or contractions. She confirms normal fetal movement.    Abdominal Pain  This is a new problem. The current episode started yesterday. The onset quality is sudden. The problem occurs constantly. The problem has been unchanged. The pain is located in the RUQ. The pain is at a severity of 8/10. The quality of the pain is sharp. The abdominal pain radiates to the back. Pertinent negatives include no fever or headaches. Nothing aggravates the pain. The pain is relieved by nothing. She has tried nothing for the symptoms.    Clinic  [facility] Prenatal Labs  Dating  US@11weeks  Blood type: A/Positive/-- (11/29 1614)   Genetic Screen   AFP:     NIPS:Mat21:normal Antibody:Negative (11/29 1614)  Anatomic [place] Normal ; female fetus Rubella: 2.87 (11/29 1614)  GTT  Third trimester: WNL RPR: Non Reactive (04/04 1040)   Flu vaccine  decline HBsAg: Negative (11/29 1614)   TDaP vaccine  09/05/2016                                             Rhogam:n/a A+ HIV: Non Reactive (04/04 1040)   Baby Food      bottle                                         GBS: (For PCN allergy, check sensitivities)  Contraception undecided Pap:05/02/16:normal  Circumcision Yes   Pediatrician Info given   Support Person fob      . OB History    Gravida Para Term Preterm AB Living   0 1 2   SAB TAB Ectopic Multiple Live Births   1 0 0 0 2     Past Medical History:  Diagnosis Date  . Abnormal Pap smear of cervix    unknown stage  of abnormality  . Anemia   . Hx of chlamydia infection   . Post partum depression    Past Surgical History:  Procedure Laterality Date  . DILATION AND CURETTAGE OF UTERUS    . LAPAROSCOPIC APPENDECTOMY  06/14/2012   Procedure: APPENDECTOMY LAPAROSCOPIC;  Surgeon: Clovis Pu. Cornett, MD;  Location: [facility] OR;  Service: General;  Laterality: N/A;  . wisdom tooth extracted x1 in 04/2011     Family History: family history includes Diabetes in her maternal aunt; Heart disease in her maternal grandmother; Hypertension in her mother; Kidney disease in her maternal grandmother. Social History:  reports that she quit smoking about 5 years ago. She has a 1.50 pack-year smoking history. She has never used smokeless tobacco. She reports that she does not drink alcohol or use drugs.     Maternal Diabetes: No Genetic Screening: Normal Maternal Ultrasounds/Referrals: Normal Fetal Ultrasounds  or other Referrals:  None Maternal Substance Abuse:  No Significant Maternal Medications:  Meds include: Other:  Significant Maternal Lab Results:  Lab values include: Other:  Other Comments:  Labetalol 200 mg bid, GBS unknown, collected 09/11/16  Review of Systems  Constitutional: Negative for chills, fever and malaise/fatigue.  Eyes: Negative for blurred vision.  Respiratory: Negative for cough and shortness of breath.   Cardiovascular: Negative for chest pain.  Gastrointestinal: Negative for heartburn and vomiting.  Genitourinary: Negative for dysuria, frequency and urgency.  Musculoskeletal: Negative.   Neurological: Negative for dizziness and headaches.  Psychiatric/Behavioral: Negative for depression.   Maternal Medical History:  Reason for admission: RUQ pain, preeclampsia with severe features  Contractions: Frequency: rare.   Perceived severity is mild.    Fetal activity: Perceived fetal activity is normal.   Last perceived fetal movement was within the past hour.    Prenatal complications:  Pre-eclampsia.   Prenatal Complications - Diabetes: none.      Blood pressure (!) 175/110, pulse 63, temperature 98.6 F (37 C), temperature source Oral, resp. rate 18, height  (1.549 m), weight 160 lb (72.6 kg), last menstrual period 02/29/2016, SpO2 98 %.   Patient Vitals for the past 24 hrs:  BP Temp Temp src Pulse Resp SpO2 Height Weight  09/11/16 0911 (!) 164/120 - - 69 - - - -  09/11/16 0910 (!) 152/106 - - 70 - - - -  09/11/16 0902 (!) 165/110 - - 72 - - - -  09/11/16 0852 (!) 175/110 - - 63 - - - -  09/11/16 0848 (!) 160/106 - - 65 - 98 % - -  09/11/16 0842 (!) 157/104 - - 68 - - - -  09/11/16 0834 - - - 63 - 99 % - -  09/11/16 0832 (!) 180/102 - - (!) 59 - - - -  09/11/16 0817 (!) 159/97 - - 61 - - - -  09/11/16 0802 (!) 169/134 - - 64 - - - -  09/11/16 0800 - - - 63 - 99 % - -  09/11/16 0747 (!) 165/102 - - (!) 59 - - - -  09/11/16 0732 (!) 167/108 - - 60 - - - -  09/11/16 0730 - - - - - -  (1.549 m) 160 lb (72.6 kg)  09/11/16 0717 (!) 150/102 - - 61 - - - -  09/11/16 0702 (!) 163/100 - - 63 - - - -  09/11/16 0659 (!) 155/98 98.6 F (37 C) Oral 66 18 - - -   Maternal Exam:  Uterine Assessment: Contraction frequency is rare.   Abdomen: Patient reports no abdominal tenderness.   Fetal Exam Fetal Monitor Review: Mode: ultrasound.   Baseline rate: 145.  Variability: moderate (6-25 bpm).   Pattern: accelerations present and no decelerations.    Fetal State Assessment: Category I - tracings are normal.     Physical Exam  Nursing note and vitals reviewed. Constitutional: She is oriented to person, place, and time. She appears well-developed and well-nourished.  Neck: Normal range of motion.  Cardiovascular: Normal rate and regular rhythm.   Respiratory: Effort normal and breath sounds normal.  GI: Soft.  Musculoskeletal: Normal range of motion.  Neurological: She is alert and oriented to person, place, and time. She has normal reflexes.  Skin: Skin  is warm and dry.  Psychiatric: She has a normal mood and affect. Her behavior is normal. Judgment and thought content normal.    Prenatal labs: ABO,  Rh: A/Positive/-- (11/29 1614) Antibody: Negative (11/29 1614) Rubella: 2.87 (11/29 1614) RPR: Non Reactive (04/04 1040)  HBsAg: Negative (11/29 1614)  HIV: Non Reactive (04/04 1040)  GBS:   Unknown, collected 09/11/16  Assessment/Plan: 26 y.o. [id number]  Preeclampsia with severe features  GBS unknown  Admit to antepartum Continuous EFM Magnesium Sulfate 4g bolus then 2g/hour x 24 hours Growth US/dopplers Repeat labs    Sharen Counter 09/11/2016, 9:12 AM

## 2016-09-11 NOTE — MAU Provider Note (Deleted)
History     CSN: [id number]  Arrival date and time: 09/11/16 4098   First Provider Initiated Contact with Patient 09/11/16 [phone]      Chief Complaint  Patient presents with  . Hypertension  . Abdominal Pain   Jennifer Peters is a 26 y.o. [phone] at [redacted]w[redacted]d who presents today with RUQ pain since yesterday. She has been followed for high blood pressure since 09/05/16. She was seen in the office that day with new onset hypertension., and has been seen in MAU several times since. At one point she was started on labetalol  BID. She has not taken a dose today. She denies any VB, LOF, or contractions. She confirms normal fetal movement. Patient has had BMZ on 4/5 and 09/08/16.    Abdominal Pain  This is a new problem. The current episode started yesterday. The onset quality is sudden. The problem occurs constantly. The problem has been unchanged. The pain is located in the RUQ. The pain is at a severity of 8/10. The quality of the pain is sharp. The abdominal pain radiates to the back. Pertinent negatives include no fever or headaches. Nothing aggravates the pain. The pain is relieved by nothing. She has tried nothing for the symptoms.   Past Medical History:  Diagnosis Date  . Abnormal Pap smear of cervix    unknown stage of abnormality  . Anemia   . Hx of chlamydia infection   . Post partum depression     Past Surgical History:  Procedure Laterality Date  . DILATION AND CURETTAGE OF UTERUS    . LAPAROSCOPIC APPENDECTOMY  06/14/2012   Procedure: APPENDECTOMY LAPAROSCOPIC;  Surgeon: Clovis Pu. Cornett, MD;  Location: [facility] OR;  Service: General;  Laterality: N/A;  . wisdom tooth extracted x1 in 04/2011      Family History  Problem Relation Age of Onset  . Kidney disease Maternal Grandmother   . Heart disease Maternal Grandmother     chf  . Diabetes Maternal Aunt   . Hypertension Mother   . Other Neg Hx     Social History  Substance Use Topics  . Smoking status: Former Smoker   Packs/day: 0.50    Years: 3.00    Quit date: 08/21/2011  . Smokeless tobacco: Never Used  . Alcohol use No    Allergies:  Allergies  Allergen Reactions  . Latex Itching and Rash    Prescriptions Prior to Admission  Medication Sig Dispense Refill Last Dose  . acetaminophen (TYLENOL) 325 MG tablet Take 650 mg by mouth every 6 (six) hours as needed.   Past Month at Unknown time  . aspirin 81 MG chewable tablet Chew 1 tablet (81 mg total) by mouth daily. 30 tablet 3 Taking  . Elastic Bandages & Supports (WRIST BRACE/RIGHT MEDIUM) MISC 1 Device by Does not apply route daily. 1 each 0 Taking  . labetalol (NORMODYNE) 200 MG tablet Take 1 tablet (200 mg total) by mouth 2 (two) times daily. 60 tablet 3   . Prenatal Vit-Fe Phos-FA-Omega (VITAFOL GUMMIES) 3.33-0.333-34.8 MG CHEW Chew 3 tablets by mouth at bedtime. 90 tablet 12 Taking    Review of Systems  Constitutional: Negative for fever.  Eyes: Negative for visual disturbance.  Gastrointestinal: Positive for abdominal pain.  Genitourinary: Negative for vaginal bleeding and vaginal discharge.  Neurological: Negative for headaches.   Physical Exam   Blood pressure (!) 165/102, pulse (!) 59, temperature 98.6 F (37 C), temperature source Oral, resp. rate 18, height  (1.549 m),  weight 72.6 kg (160 lb), last menstrual period 02/29/2016.  Physical Exam  Nursing note and vitals reviewed. Constitutional: She is oriented to person, place, and time. She appears well-developed and well-nourished. No distress.  HENT:  Head: Normocephalic.  Cardiovascular: Normal rate.   Respiratory: Effort normal.  GI: Soft. There is tenderness (in RUQ ).  Neurological: She is alert and oriented to person, place, and time.  Skin: Skin is warm and dry.  Psychiatric: She has a normal mood and affect.   FHT: 150, moderate with 10x10 accels, no decels Toco: no UCs  MAU Course  Procedures  MDM CBC, CMET, P:Cr pending 0800 Care turned over to [facility] Tawnya Crook  7:58 AM 09/11/16   ASSESSMENT/PLAN:

## 2016-09-11 NOTE — MAU Note (Signed)
Patient states a Dr. Jeanene Erb from [facility] after hours line to tell her to come in at 0200 to be seen for blood pressures.  Patient was getting home Bps 175/103.  States she has been nauseous since last night.  Having back/right sided pain.  Good FM, no LOF or VB.  Currently taking Labetalol 200 mg BID since Friday.

## 2016-09-11 NOTE — Progress Notes (Signed)
1300-1400 Patient off monitor for bedside ultrasound

## 2016-09-11 NOTE — Progress Notes (Signed)
RN at bedside adjusting cardio. Pt having difficulty lying still due to epigastic pain.

## 2016-09-12 DIAGNOSIS — Z3A28 28 weeks gestation of pregnancy: Secondary | ICD-10-CM

## 2016-09-12 LAB — CBC
HCT: 29.8 % — ABNORMAL LOW (ref 36.0–46.0)
HCT: 30.6 % — ABNORMAL LOW (ref 36.0–46.0)
HEMOGLOBIN: 10.4 g/dL — AB (ref 12.0–15.0)
Hemoglobin: 10.1 g/dL — ABNORMAL LOW (ref 12.0–15.0)
MCH: 35.6 pg — ABNORMAL HIGH (ref 26.0–34.0)
MCH: 35.9 pg — ABNORMAL HIGH (ref 26.0–34.0)
MCHC: 33.9 g/dL (ref 30.0–36.0)
MCHC: 34 g/dL (ref 30.0–36.0)
MCV: 104.9 fL — ABNORMAL HIGH (ref 78.0–100.0)
MCV: 105.5 fL — ABNORMAL HIGH (ref 78.0–100.0)
PLATELETS: 122 10*3/uL — AB (ref 150–400)
Platelets: 126 10*3/uL — ABNORMAL LOW (ref 150–400)
RBC: 2.84 MIL/uL — ABNORMAL LOW (ref 3.87–5.11)
RBC: 2.9 MIL/uL — ABNORMAL LOW (ref 3.87–5.11)
RDW: 13.4 % (ref 11.5–15.5)
RDW: 13.3 % (ref 11.5–15.5)
WBC: 12.6 10*3/uL — AB (ref 4.0–10.5)
WBC: 11.4 10*3/uL — AB (ref 4.0–10.5)

## 2016-09-12 LAB — URINE CULTURE, OB REFLEX

## 2016-09-12 LAB — COMPREHENSIVE METABOLIC PANEL
ALBUMIN: 2.7 g/dL — AB (ref 3.5–5.0)
ALK PHOS: 66 U/L (ref 38–126)
ALK PHOS: 71 U/L (ref 38–126)
ALT: 65 U/L — AB (ref 14–54)
ALT: 61 U/L — ABNORMAL HIGH (ref 14–54)
ANION GAP: 5 (ref 5–15)
AST: 61 U/L — AB (ref 15–41)
AST: 48 U/L — AB (ref 15–41)
Albumin: 2.7 g/dL — ABNORMAL LOW (ref 3.5–5.0)
Anion gap: 8 (ref 5–15)
BILIRUBIN TOTAL: 0.4 mg/dL (ref 0.3–1.2)
BUN: 9 mg/dL (ref 6–20)
BUN: 8 mg/dL (ref 6–20)
CALCIUM: 6.8 mg/dL — AB (ref 8.9–10.3)
CO2: 22 mmol/L (ref 22–32)
CO2: 25 mmol/L (ref 22–32)
CREATININE: 0.69 mg/dL (ref 0.44–1.00)
Calcium: 6.7 mg/dL — ABNORMAL LOW (ref 8.9–10.3)
Chloride: 106 mmol/L (ref 101–111)
Chloride: 106 mmol/L (ref 101–111)
Creatinine, Ser: 0.74 mg/dL (ref 0.44–1.00)
GFR calc Af Amer: >60 mL/min (ref >60)
GFR calc Af Amer: >60 mL/min (ref >60)
GFR calc non Af Amer: >60 mL/min (ref >60)
GLUCOSE: 97 mg/dL (ref 65–99)
GLUCOSE: 92 mg/dL (ref 65–99)
POTASSIUM: 3.7 mmol/L (ref 3.5–5.1)
Potassium: 3.8 mmol/L (ref 3.5–5.1)
SODIUM: 136 mmol/L (ref 135–145)
Sodium: 136 mmol/L (ref 135–145)
TOTAL PROTEIN: 5.6 g/dL — AB (ref 6.5–8.1)
Total Bilirubin: 0.5 mg/dL (ref 0.3–1.2)
Total Protein: 5.8 g/dL — ABNORMAL LOW (ref 6.5–8.1)

## 2016-09-12 LAB — DIC (DISSEMINATED INTRAVASCULAR COAGULATION)PANEL
D-Dimer, Quant: 1.57 ug/mL-FEU — ABNORMAL HIGH (ref 0.00–0.50)
INR: 0.98
Platelets: 127 10*3/uL — ABNORMAL LOW (ref 150–400)
Prothrombin Time: 13 seconds (ref 11.4–15.2)

## 2016-09-12 LAB — CULTURE, OB URINE

## 2016-09-12 LAB — DIC (DISSEMINATED INTRAVASCULAR COAGULATION) PANEL
APTT: 31 s (ref 24–36)
FIBRINOGEN: 414 mg/dL (ref 210–475)
SMEAR REVIEW: NONE SEEN

## 2016-09-12 MED ORDER — BUTALBITAL-APAP-CAFFEINE 50-325-40 MG PO TABS
2.0000 | ORAL_TABLET | Freq: Once | ORAL | Status: AC
  Administered 2016-09-12: 2 via ORAL
  Filled 2016-09-12: qty 2

## 2016-09-12 MED ORDER — BUTALBITAL-APAP-CAFFEINE 50-325-40 MG PO TABS
2.0000 | ORAL_TABLET | Freq: Once | ORAL | Status: AC
Start: 2016-09-12 — End: 2016-09-12
  Administered 2016-09-12: 2 via ORAL
  Filled 2016-09-12: qty 2

## 2016-09-12 MED ORDER — MAGNESIUM SULFATE 40 G IN LACTATED RINGERS - SIMPLE
2.0000 g/h | INTRAVENOUS | Status: DC
  Administered 2016-09-12 – 2016-09-13 (×2): 2 g/h via INTRAVENOUS
  Filled 2016-09-12 (×2): qty 500

## 2016-09-12 NOTE — Consult Note (Signed)
[facility] --  [facility] 09/12/2016    1:35 PM  Neonatal Medicine Consultation         Jennifer Peters          MRN:  [id number]  I was called at the request of the patient's obstetrician (Dr. Adrian Blackwater) to speak to this patient due to potential premature delivery as early as 28 6/7 weeks due to severe preeclampsia.  The patient's prenatal course includes gestational hypertension, preeclampsia with severe features, recent onset of RUQ abdominal pain.  She is 28 6/7 weeks currently.  She is admitted to 3rd floor, and is receiving treatment that includes magnesium sulfate, Labetalol.  She was given betamethasone on 4/5 and 4/7.  The baby is a female.  I reviewed expectations for a baby born at about 29 weeks and beyond, including survival, length of stay, morbidities such as respiratory distress, IVH, infection, feeding intolerance.  I described how we provide respiratory and feeding support.  Mom plans to breast feed, which I encouraged as best for the baby (with supplementations for needed calories).  I let mom know that the baby's outlook generally improves the longer she remains undelivered.  I spent 40 minutes reviewing the record, speaking to the patient, and entering appropriate documentation.  More than 50% of the time was spent face to face with patient.   _____________________ Electronically Signed By: Angelita Ingles, MD Neonatologist

## 2016-09-12 NOTE — Progress Notes (Signed)
Patient ID: Jennifer Peters, female   DOB: December 09, 1990, 26 y.o.   MRN: [id number] FACULTY PRACTICE ANTEPARTUM NOTE  Jennifer Peters is a 26 y.o. [id number] at [redacted]w[redacted]d  who is admitted for preeclampsia with severe features.   Fetal presentation is cephalic. Length of Stay:  1  Days  Subjective: Patient feels well - had headache last night, which is improved after fioricet. Denies abdominal pain currently Patient reports good fetal movement.   She reports no uterine contractions She reports no bleeding  She reports no loss of fluid per vagina.  Vitals:  Blood pressure (!) 136/96, pulse 71, temperature 98.2 F (36.8 C), temperature source Oral, resp. rate 18, height  (1.575 m), weight 160 lb (72.6 kg), last menstrual period 02/29/2016, SpO2 98 %. Physical Examination:  General appearance - alert, well appearing, and in no distress Chest - clear to auscultation, no wheezes, rales or rhonchi, symmetric air entry Heart - normal rate, regular rhythm, normal S1, S2, no murmurs, rubs, clicks or gallops Abdomen - soft, nontender, nondistended, no masses or organomegaly Fundal Height:  size equals dates Extremities: extremities normal, atraumatic, no cyanosis or edema and Homans sign is negative, no sign of DVT with DTRs 3+ bilaterally Membranes:intact  Fetal Monitoring:  reactive NST  Labs:  Results for orders placed or performed during the hospital encounter of 09/11/16 (from the past 24 hour(s))  CBC   Collection Time: 09/11/16  7:51 AM  Result Value Ref Range   WBC 13.5 (H) 4.0 - 10.5 K/uL   RBC 3.00 (L) 3.87 - 5.11 MIL/uL   Hemoglobin 10.6 (L) 12.0 - 15.0 g/dL   HCT 28.4 (L) 13.2 - 44.0 %   MCV 104.7 (H) 78.0 - 100.0 fL   MCH 35.3 (H) 26.0 - 34.0 pg   MCHC 33.8 30.0 - 36.0 g/dL   RDW 10.2 72.5 - 36.6 %   Platelets 203 150 - 400 K/uL  Comprehensive metabolic panel   Collection Time: 09/11/16  7:51 AM  Result Value Ref Range   Sodium 138 135 - 145 mmol/L   Potassium 3.6 3.5 - 5.1 mmol/L    Chloride 106 101 - 111 mmol/L   CO2 23 22 - 32 mmol/L   Glucose, Bld 75 65 - 99 mg/dL   BUN 9 6 - 20 mg/dL   Creatinine, Ser 4.40 0.44 - 1.00 mg/dL   Calcium 8.2 (L) 8.9 - 10.3 mg/dL   Total Protein 6.1 (L) 6.5 - 8.1 g/dL   Albumin 2.9 (L) 3.5 - 5.0 g/dL   AST 67 (H) 15 - 41 U/L   ALT 52 14 - 54 U/L   Alkaline Phosphatase 65 38 - 126 U/L   Total Bilirubin 0.8 0.3 - 1.2 mg/dL   GFR calc non Af Amer >60 >60 mL/min   GFR calc Af Amer >60 >60 mL/min   Anion gap 9 5 - 15  Type and screen [facility] [place]   Collection Time: 09/11/16  8:15 AM  Result Value Ref Range   ABO/RH(D) A POS    Antibody Screen NEG    Sample Expiration 09/14/2016   ABO/Rh   Collection Time: 09/11/16  8:15 AM  Result Value Ref Range   ABO/RH(D) A POS   Comprehensive metabolic panel   Collection Time: 09/12/16  5:18 AM  Result Value Ref Range   Sodium 136 135 - 145 mmol/L   Potassium 3.7 3.5 - 5.1 mmol/L   Chloride 106 101 - 111 mmol/L   CO2 22  22 - 32 mmol/L   Glucose, Bld 97 65 - 99 mg/dL   BUN 9 6 - 20 mg/dL   Creatinine, Ser 1.61 0.44 - 1.00 mg/dL   Calcium 6.8 (L) 8.9 - 10.3 mg/dL   Total Protein 5.6 (L) 6.5 - 8.1 g/dL   Albumin 2.7 (L) 3.5 - 5.0 g/dL   AST 61 (H) 15 - 41 U/L   ALT 65 (H) 14 - 54 U/L   Alkaline Phosphatase 66 38 - 126 U/L   Total Bilirubin 0.4 0.3 - 1.2 mg/dL   GFR calc non Af Amer >60 >60 mL/min   GFR calc Af Amer >60 >60 mL/min   Anion gap 8 5 - 15  CBC   Collection Time: 09/12/16  5:18 AM  Result Value Ref Range   WBC 12.6 (H) 4.0 - 10.5 K/uL   RBC 2.84 (L) 3.87 - 5.11 MIL/uL   Hemoglobin 10.1 (L) 12.0 - 15.0 g/dL   HCT 09.6 (L) 04.5 - 40.9 %   MCV 104.9 (H) 78.0 - 100.0 fL   MCH 35.6 (H) 26.0 - 34.0 pg   MCHC 33.9 30.0 - 36.0 g/dL   RDW 81.1 91.4 - 78.2 %   Platelets 122 (L) 150 - 400 K/uL    Imaging Studies:    US shows EFW 1089g (28%), AFI 20.   Medications:  Scheduled . aspirin  81 mg Oral Daily  . docusate sodium  100 mg Oral Daily  .  labetalol  200 mg Oral TID  . prenatal multivitamin  1 tablet Oral Q1200   I have reviewed the patient's current medications.  ASSESSMENT: Principal Problem:   Severe preeclampsia, third trimester Active Problems:   [redacted] weeks gestation of pregnancy   PLAN: 1. Preeclampsia with severe features  Although BP improved, by patient's blood work, condition worsening.  BP controlled - continue labetalol  TID  LFTs increased: AST/ALT: 61/65.  Platelets: 203 decreased to 122   Continue magnesium  Continuous monitoring  Repeat labs at noon with DIC panel  NICU consulted. 2. 28 week pregnancy  FHT reassuring. Not reactive, but appropriate for GA. Continue routine antenatal care.   Levie Heritage, DO 09/12/2016,6:58 AM

## 2016-09-13 ENCOUNTER — Telehealth: Payer: Self-pay | Admitting: *Deleted

## 2016-09-13 DIAGNOSIS — O1410 Severe pre-eclampsia, unspecified trimester: Secondary | ICD-10-CM

## 2016-09-13 DIAGNOSIS — Z3A29 29 weeks gestation of pregnancy: Secondary | ICD-10-CM

## 2016-09-13 LAB — COMPREHENSIVE METABOLIC PANEL
ALT: 45 U/L (ref 14–54)
ANION GAP: 5 (ref 5–15)
AST: 30 U/L (ref 15–41)
Albumin: 2.7 g/dL — ABNORMAL LOW (ref 3.5–5.0)
Alkaline Phosphatase: 75 U/L (ref 38–126)
BILIRUBIN TOTAL: 0.2 mg/dL — AB (ref 0.3–1.2)
BUN: 9 mg/dL (ref 6–20)
CO2: 25 mmol/L (ref 22–32)
Calcium: 6.5 mg/dL — ABNORMAL LOW (ref 8.9–10.3)
Chloride: 106 mmol/L (ref 101–111)
Creatinine, Ser: 0.65 mg/dL (ref 0.44–1.00)
Glucose, Bld: 108 mg/dL — ABNORMAL HIGH (ref 65–99)
POTASSIUM: 4 mmol/L (ref 3.5–5.1)
Sodium: 136 mmol/L (ref 135–145)
TOTAL PROTEIN: 5.7 g/dL — AB (ref 6.5–8.1)

## 2016-09-13 LAB — CBC
HCT: 30.4 % — ABNORMAL LOW (ref 36.0–46.0)
Hemoglobin: 10.3 g/dL — ABNORMAL LOW (ref 12.0–15.0)
MCH: 36 pg — AB (ref 26.0–34.0)
MCHC: 33.9 g/dL (ref 30.0–36.0)
MCV: 106.3 fL — ABNORMAL HIGH (ref 78.0–100.0)
Platelets: 131 10*3/uL — ABNORMAL LOW (ref 150–400)
RBC: 2.86 MIL/uL — ABNORMAL LOW (ref 3.87–5.11)
RDW: 13.5 % (ref 11.5–15.5)
WBC: 11 10*3/uL — AB (ref 4.0–10.5)

## 2016-09-13 LAB — CULTURE, BETA STREP (GROUP B ONLY)

## 2016-09-13 MED ORDER — LABETALOL HCL 200 MG PO TABS
400.0000 mg | ORAL_TABLET | Freq: Three times a day (TID) | ORAL | Status: DC
  Administered 2016-09-14: 400 mg via ORAL
  Filled 2016-09-13: qty 2

## 2016-09-13 MED ORDER — LABETALOL HCL 200 MG PO TABS
400.0000 mg | ORAL_TABLET | Freq: Once | ORAL | Status: AC
  Administered 2016-09-13: 400 mg via ORAL
  Filled 2016-09-13: qty 2

## 2016-09-13 NOTE — Progress Notes (Signed)
Allergies updated.  

## 2016-09-13 NOTE — Progress Notes (Signed)
Patient ID: Jenessa Gillingham, female   DOB: 1990/09/11, 26 y.o.   MRN: [id number] FACULTY PRACTICE ANTEPARTUM NOTE  Rochella Benner is a 26 y.o. [id number] at [redacted]w[redacted]d   who is admitted for preeclampsia with severe features.   Fetal presentation is cephalic. Length of Stay:  2  Days  Subjective: Patient feels well - had headache last night, which is improved after fioricet. On exam +trigger right trapezius, responded to accupressure therapy of insertion on right occiput this am, pt taught self care, so this headache is not related to the pre eclampsia process it seems, only right sided and is not consistent with vascular spasm  Denies abdominal pain currently Patient reports good fetal movement.   She reports no uterine contractions She reports no bleeding  She reports no loss of fluid per vagina.  Vitals:  Blood pressure (!) 147/97, pulse 72, temperature 97.8 F (36.6 C), temperature source Oral, resp. rate 18, height  (1.575 m), weight 160 lb (72.6 kg), last menstrual period 02/29/2016, SpO2 100 %. Physical Examination:  General appearance - alert, well appearing, and in no distress Chest - clear to auscultation, no wheezes, rales or rhonchi, symmetric air entry Heart - normal rate, regular rhythm, normal S1, S2, no murmurs, rubs, clicks or gallops Abdomen - soft, nontender, nondistended, no masses or organomegaly Fundal Height:  size equals dates Extremities: extremities normal, atraumatic, no cyanosis or edema and Homans sign is negative, no sign of DVT with DTRs 3+ bilaterally Membranes:intact  Fetal Monitoring:  reactive NST  Labs:  Results for orders placed or performed during the hospital encounter of 09/11/16 (from the past 24 hour(s))  CBC   Collection Time: 09/12/16 11:53 AM  Result Value Ref Range   WBC 11.4 (H) 4.0 - 10.5 K/uL   RBC 2.90 (L) 3.87 - 5.11 MIL/uL   Hemoglobin 10.4 (L) 12.0 - 15.0 g/dL   HCT 14.7 (L) 82.9 - 56.2 %   MCV 105.5 (H) 78.0 - 100.0 fL   MCH 35.9 (H)  26.0 - 34.0 pg   MCHC 34.0 30.0 - 36.0 g/dL   RDW 13.0 86.5 - 78.4 %   Platelets 126 (L) 150 - 400 K/uL  Comprehensive metabolic panel   Collection Time: 09/12/16 11:53 AM  Result Value Ref Range   Sodium 136 135 - 145 mmol/L   Potassium 3.8 3.5 - 5.1 mmol/L   Chloride 106 101 - 111 mmol/L   CO2 25 22 - 32 mmol/L   Glucose, Bld 92 65 - 99 mg/dL   BUN 8 6 - 20 mg/dL   Creatinine, Ser 6.96 0.44 - 1.00 mg/dL   Calcium 6.7 (L) 8.9 - 10.3 mg/dL   Total Protein 5.8 (L) 6.5 - 8.1 g/dL   Albumin 2.7 (L) 3.5 - 5.0 g/dL   AST 48 (H) 15 - 41 U/L   ALT 61 (H) 14 - 54 U/L   Alkaline Phosphatase 71 38 - 126 U/L   Total Bilirubin 0.5 0.3 - 1.2 mg/dL   GFR calc non Af Amer >60 >60 mL/min   GFR calc Af Amer >60 >60 mL/min   Anion gap 5 5 - 15  DIC (disseminated intravasc coag) panel   Collection Time: 09/12/16 11:53 AM  Result Value Ref Range   Prothrombin Time 13.0 11.4 - 15.2 seconds   INR 0.98    aPTT 31 24 - 36 seconds   Fibrinogen 414 210 - 475 mg/dL   D-Dimer, Quant 2.95 (H) 0.00 - 0.50 ug/mL-FEU  Platelets 127 (L) 150 - 400 K/uL   Smear Review NO SCHISTOCYTES SEEN   CBC   Collection Time: 09/13/16  6:26 AM  Result Value Ref Range   WBC 11.0 (H) 4.0 - 10.5 K/uL   RBC 2.86 (L) 3.87 - 5.11 MIL/uL   Hemoglobin 10.3 (L) 12.0 - 15.0 g/dL   HCT 40.9 (L) 81.1 - 91.4 %   MCV 106.3 (H) 78.0 - 100.0 fL   MCH 36.0 (H) 26.0 - 34.0 pg   MCHC 33.9 30.0 - 36.0 g/dL   RDW 78.2 95.6 - 21.3 %   Platelets 131 (L) 150 - 400 K/uL  Comprehensive metabolic panel   Collection Time: 09/13/16  6:26 AM  Result Value Ref Range   Sodium 136 135 - 145 mmol/L   Potassium 4.0 3.5 - 5.1 mmol/L   Chloride 106 101 - 111 mmol/L   CO2 25 22 - 32 mmol/L   Glucose, Bld 108 (H) 65 - 99 mg/dL   BUN 9 6 - 20 mg/dL   Creatinine, Ser 0.86 0.44 - 1.00 mg/dL   Calcium 6.5 (L) 8.9 - 10.3 mg/dL   Total Protein 5.7 (L) 6.5 - 8.1 g/dL   Albumin 2.7 (L) 3.5 - 5.0 g/dL   AST 30 15 - 41 U/L   ALT 45 14 - 54 U/L    Alkaline Phosphatase 75 38 - 126 U/L   Total Bilirubin 0.2 (L) 0.3 - 1.2 mg/dL   GFR calc non Af Amer >60 >60 mL/min   GFR calc Af Amer >60 >60 mL/min   Anion gap 5 5 - 15    Imaging Studies:    US shows EFW 1089g (28%), AFI 20.   Medications:  Scheduled . aspirin  81 mg Oral Daily  . docusate sodium  100 mg Oral Daily  . labetalol  200 mg Oral TID  . prenatal multivitamin  1 tablet Oral Q1200   I have reviewed the patient's current medications.  ASSESSMENT: Principal Problem:   Severe preeclampsia, third trimester Active Problems:   [redacted] weeks gestation of pregnancy   PLAN: 1. Preeclampsia with severe features  Although BP improved, labs are stable to slightly improving  BP controlled - continue labetalol  TID  LFTs increased: AST/ALT: 61/65.  Platelets:stable  Magnesium stopped this am, s/p 24 hours  Continuous monitoring  NICU consulted. 2. 28 week pregnancy  FHT reassuring. Not reactive, but appropriate for GA. Continue routine antenatal care.   Lazaro Arms, MD 09/13/2016,7:41 AMPatient ID: Marney Setting, female   DOB: 1990-09-13, 26 y.o.   MRN: [id number]

## 2016-09-13 NOTE — Progress Notes (Signed)
Spoke with Dr. Despina Hidden in regards to latest BP with increased diastolic.  No new orders at this point.

## 2016-09-13 NOTE — Progress Notes (Signed)
New verbal  order to change from continuous monitoring to 30  Minute every shift.

## 2016-09-13 NOTE — Telephone Encounter (Signed)
Spoke with pt today regarding FMLA paper work. Pt states she was admitted to [facility] this week and plans to be there until delivery, unsure of timeframe. Addendum was made to initial FMLA papers and was faxed to pt employer. Pt aware of changes to be made. Pt advised to call office if any problems getting coverage.

## 2016-09-14 ENCOUNTER — Encounter (HOSPITAL_COMMUNITY): Payer: Self-pay | Admitting: Obstetrics & Gynecology

## 2016-09-14 MED ORDER — LABETALOL HCL 200 MG PO TABS
600.0000 mg | ORAL_TABLET | Freq: Three times a day (TID) | ORAL | Status: DC
  Administered 2016-09-14 – 2016-09-15 (×3): 600 mg via ORAL
  Filled 2016-09-14 (×3): qty 3

## 2016-09-14 MED ORDER — LABETALOL HCL 200 MG PO TABS: 600.0000 mg | ORAL_TABLET | Freq: Three times a day (TID) | ORAL | Status: DC

## 2016-09-14 NOTE — Progress Notes (Signed)
Patient ID: Jennifer Peters, female   DOB: September 04, 1990, 26 y.o.   MRN: [id number] FACULTY PRACTICE ANTEPARTUM NOTE  Jennifer Peters is a 26 y.o. [id number] at [redacted]w[redacted]d  who is admitted for preeclampsia with severe features.   Fetal presentation is cephalic. Length of Stay:  3  Days  Subjective: Patient required one dose of IV hydralazine overnight for severe range BP. Currently denies headaches, visual symptoms or abdominal pain/ Patient reports good fetal movement.   She reports no uterine contractions She reports no bleeding  She reports no loss of fluid per vagina.  Vitals:  Blood pressure 134/76, pulse 78, temperature 97.8 F (36.6 C), temperature source Axillary, resp. rate 16, Peters  (1.575 m), weight 160 lb (72.6 kg), last menstrual period 02/29/2016, SpO2 100 %.  Patient Vitals for the past 24 hrs:  BP Temp Temp src Pulse Resp SpO2  09/14/16 0646 134/76 - - 78 - -  09/14/16 0612 (!) 172/96 - - 64 - -  09/14/16 0549 (!) 168/94 97.8 F (36.6 C) Axillary 65 16 -  09/14/16 0008 (!) 157/100 - - 73 16 100 %  09/13/16 1940 (!) 158/101 - - 71 - -  09/13/16 1928 (!) 162/101 99 F (37.2 C) Oral 67 16 -  09/13/16 1625 (!) 151/94 98.6 F (37 C) Oral 77 20 -  09/13/16 1228 139/85 98.6 F (37 C) Oral 80 18 100 %  09/13/16 0933 (!) 148/99 - - 67 - -  09/13/16 0751 (!) 153/100 98.4 F (36.9 C) Oral 86 18 100 %    Physical Examination: General appearance - alert, well appearing, and in no distress Chest - clear to auscultation, no wheezes, rales or rhonchi, symmetric air entry Heart - normal rate, regular rhythm, normal S1, S2, no murmurs, rubs, clicks or gallops Abdomen - soft, nontender, nondistended, no masses or organomegaly Fundal Peters:  size equals dates Extremities: extremities normal, atraumatic, no cyanosis or edema and Homans sign is negative, no sign of DVT with DTRs 3+ bilaterally Membranes:intact  Fetal Monitoring:  reactive NST  Labs:  No results found for this or any  previous visit (from the past 24 hour(s)).  Imaging Studies:    US shows EFW 1089g (28%), AFI 20.   Medications:  Scheduled . aspirin  81 mg Oral Daily  . docusate sodium  100 mg Oral Daily  . labetalol  400 mg Oral Q8H  . prenatal multivitamin  1 tablet Oral Q1200   I have reviewed the patient's current medications.  ASSESSMENT: Principal Problem:   Severe preeclampsia, third trimester Active Problems:   Supervision of high risk pregnancy in third trimester   PLAN: 1. Preeclampsia with severe features  Labetalol increased to 600 mg po tid  LFTs decreased AST/ALT: 45/30  Platelets:stable  Magnesium stopped 4/12 am, s/p 24 hours  NST  q shift  NICU consulted. 2. 29 week pregnancy  FHT reassuring. Not reactive, but appropriate for GA. Continue routine antenatal care.   Tereso Newcomer, MD 09/14/2016,7:31 AM

## 2016-09-14 NOTE — Progress Notes (Signed)
T/C MD to look at strip. MD ok with strip.

## 2016-09-14 NOTE — Progress Notes (Signed)
Fetal monitoring strip from 16:30 this pm reviewed.  Occasional contractions, occasional decel with prompt recovery, good beat to beat variability. Continue inpatient care unchanged.

## 2016-09-14 NOTE — Progress Notes (Signed)
10 mg Hydralazine administered

## 2016-09-15 LAB — COMPREHENSIVE METABOLIC PANEL
ALBUMIN: 2.8 g/dL — AB (ref 3.5–5.0)
ALK PHOS: 77 U/L (ref 38–126)
ALT: 68 U/L — ABNORMAL HIGH (ref 14–54)
AST: 73 U/L — AB (ref 15–41)
Anion gap: 6 (ref 5–15)
BILIRUBIN TOTAL: 0.4 mg/dL (ref 0.3–1.2)
BUN: 11 mg/dL (ref 6–20)
CALCIUM: 8.4 mg/dL — AB (ref 8.9–10.3)
CO2: 20 mmol/L — ABNORMAL LOW (ref 22–32)
Chloride: 108 mmol/L (ref 101–111)
Creatinine, Ser: 0.73 mg/dL (ref 0.44–1.00)
GFR calc Af Amer: >60 mL/min (ref >60)
GLUCOSE: 101 mg/dL — AB (ref 65–99)
Potassium: 4.6 mmol/L (ref 3.5–5.1)
Sodium: 134 mmol/L — ABNORMAL LOW (ref 135–145)
TOTAL PROTEIN: 6.1 g/dL — AB (ref 6.5–8.1)

## 2016-09-15 LAB — CBC
HEMATOCRIT: 30 % — AB (ref 36.0–46.0)
HEMOGLOBIN: 10.4 g/dL — AB (ref 12.0–15.0)
MCH: 36.4 pg — ABNORMAL HIGH (ref 26.0–34.0)
MCHC: 34.7 g/dL (ref 30.0–36.0)
MCV: 104.9 fL — AB (ref 78.0–100.0)
Platelets: 142 10*3/uL — ABNORMAL LOW (ref 150–400)
RBC: 2.86 MIL/uL — ABNORMAL LOW (ref 3.87–5.11)
RDW: 13.5 % (ref 11.5–15.5)
WBC: 11.2 10*3/uL — ABNORMAL HIGH (ref 4.0–10.5)

## 2016-09-15 LAB — TYPE AND SCREEN
ABO/RH(D): A POS
Antibody Screen: NEGATIVE

## 2016-09-15 MED ORDER — LABETALOL HCL 200 MG PO TABS
800.0000 mg | ORAL_TABLET | Freq: Three times a day (TID) | ORAL | Status: DC
  Administered 2016-09-15 – 2016-09-17 (×6): 800 mg via ORAL
  Filled 2016-09-15 (×6): qty 4

## 2016-09-15 MED ORDER — HYDRALAZINE HCL 20 MG/ML IJ SOLN
10.0000 mg | Freq: Once | INTRAMUSCULAR | Status: AC | PRN
  Administered 2016-09-15: 10 mg via INTRAVENOUS
  Filled 2016-09-15: qty 1

## 2016-09-15 MED ORDER — LABETALOL HCL 5 MG/ML IV SOLN
20.0000 mg | INTRAVENOUS | Status: AC | PRN
  Administered 2016-09-15: 40 mg via INTRAVENOUS
  Administered 2016-09-15: 80 mg via INTRAVENOUS
  Administered 2016-09-15: 20 mg via INTRAVENOUS
  Filled 2016-09-15: qty 16
  Filled 2016-09-15: qty 8

## 2016-09-15 MED ORDER — HYDRALAZINE HCL 20 MG/ML IJ SOLN: 10.0000 mg | Freq: Once | INTRAMUSCULAR | Status: DC | PRN

## 2016-09-15 MED ORDER — LABETALOL HCL 5 MG/ML IV SOLN
INTRAVENOUS | Status: AC
  Filled 2016-09-15: qty 4

## 2016-09-15 MED ORDER — BUTALBITAL-APAP-CAFFEINE 50-325-40 MG PO TABS
1.0000 | ORAL_TABLET | Freq: Four times a day (QID) | ORAL | Status: DC | PRN
  Administered 2016-09-15 – 2016-09-17 (×2): 1 via ORAL
  Filled 2016-09-15: qty 2
  Filled 2016-09-15: qty 1

## 2016-09-15 NOTE — Progress Notes (Signed)
Patient ID: Jennifer Peters, female   DOB: Jan 08, 1991, 26 y.o.   MRN: [id number]  FACULTY PRACTICE ANTEPARTUM(COMPREHENSIVE) NOTE  Jennifer Peters is a 26 y.o. [id number] at [redacted]w[redacted]d  who is admitted for severe pre eclampsia.   Length of Stay:  4  Days  Subjective: Pt has a mild headache.  Has not taken anything for it recently.  No scotomata, RUQ pain. Patient reports the fetal movement as active. Patient reports uterine contraction  activity as none. Patient reports  vaginal bleeding as none. Patient describes fluid per vagina as None.  Vitals:  Blood pressure 124/75, pulse 97, temperature 98.3 F (36.8 C), temperature source Oral, resp. rate 16, height  (1.575 m), weight 160 lb (72.6 kg), last menstrual period 02/29/2016, SpO2 100 %. Physical Examination:  General appearance - alert, well appearing, and in no distress Abdomen - soft, nontender, nondistended, no masses or organomegaly Musculoskeletal - no tenderness in LE, neg Homans  Fetal Monitoring:  Baseline: 140 bpm, Variability: Good {> 6 bpm), Accelerations: Non-reactive but appropriate for gestational age and Decelerations: Absent  Labs:  Results for orders placed or performed during the hospital encounter of 09/11/16 (from the past 24 hour(s))  Type and screen [facility] [place]   Collection Time: 09/15/16  5:37 AM  Result Value Ref Range   ABO/RH(D) A POS    Antibody Screen NEG    Sample Expiration 09/18/2016     Imaging Studies:    MFM [place] 2 days ago.  Medications:  Scheduled . aspirin  81 mg Oral Daily  . docusate sodium  100 mg Oral Daily  . labetalol  800 mg Oral Q8H  . labetalol      . prenatal multivitamin  1 tablet Oral Q1200   I have reviewed the patient's current medications. QIO:[id number], butalbital-acetaminophen-caffeine, calcium carbonate, hydrALAZINE, zolpidem  ASSESSMENT: Patient Active Problem List   Diagnosis Date Noted  . Severe preeclampsia, third trimester 09/11/2016  .  Supervision of high risk pregnancy in third trimester 05/02/2016    PLAN: 26 yo female with severe pre E 1-HTN--BPs elevated this am.  Labetalol and hydralazine protocol initiated.  Pt seemed to respond better to hydralizaine so will start with this medication next time BP becomes severe.  Increase po labetalol to 800 mg q8 hours. 2-Headache--mild nagging per patient.  Will give a fiorcett.   Pt has history of migraines. 3-LAbs today 4-Cont fetal monitoring as scheduled.    Elsie Lincoln 09/15/2016,2:03 PM

## 2016-09-16 ENCOUNTER — Inpatient Hospital Stay (HOSPITAL_COMMUNITY): Payer: Medicaid Other

## 2016-09-16 LAB — CBC
HCT: 31.5 % — ABNORMAL LOW (ref 36.0–46.0)
Hemoglobin: 10.4 g/dL — ABNORMAL LOW (ref 12.0–15.0)
MCH: 35 pg — ABNORMAL HIGH (ref 26.0–34.0)
MCHC: 33 g/dL (ref 30.0–36.0)
MCV: 106.1 fL — AB (ref 78.0–100.0)
PLATELETS: 161 10*3/uL (ref 150–400)
RBC: 2.97 MIL/uL — AB (ref 3.87–5.11)
RDW: 13.5 % (ref 11.5–15.5)
WBC: 11.3 10*3/uL — ABNORMAL HIGH (ref 4.0–10.5)

## 2016-09-16 LAB — COMPREHENSIVE METABOLIC PANEL
ALT: 65 U/L — AB (ref 14–54)
AST: 61 U/L — AB (ref 15–41)
Albumin: 2.8 g/dL — ABNORMAL LOW (ref 3.5–5.0)
Alkaline Phosphatase: 88 U/L (ref 38–126)
Anion gap: 7 (ref 5–15)
BUN: 12 mg/dL (ref 6–20)
CHLORIDE: 106 mmol/L (ref 101–111)
CO2: 20 mmol/L — AB (ref 22–32)
Calcium: 8.5 mg/dL — ABNORMAL LOW (ref 8.9–10.3)
Creatinine, Ser: 0.75 mg/dL (ref 0.44–1.00)
GFR calc Af Amer: >60 mL/min (ref >60)
Glucose, Bld: 78 mg/dL (ref 65–99)
POTASSIUM: 4.8 mmol/L (ref 3.5–5.1)
Sodium: 133 mmol/L — ABNORMAL LOW (ref 135–145)
TOTAL PROTEIN: 6.1 g/dL — AB (ref 6.5–8.1)
Total Bilirubin: 0.5 mg/dL (ref 0.3–1.2)

## 2016-09-16 MED ORDER — LACTATED RINGERS IV BOLUS (SEPSIS)
1000.0000 mL | Freq: Once | INTRAVENOUS | Status: AC
  Administered 2016-09-16: 1000 mL via INTRAVENOUS

## 2016-09-16 MED ORDER — LACTATED RINGERS IV SOLN
INTRAVENOUS | Status: DC
  Administered 2016-09-16 – 2016-09-25 (×5): via INTRAVENOUS

## 2016-09-16 MED ORDER — SOD CITRATE-CITRIC ACID 500-334 MG/5ML PO SOLN
ORAL | Status: AC
  Filled 2016-09-16: qty 15

## 2016-09-16 NOTE — Progress Notes (Signed)
FACULTY PRACTICE ANTEPARTUM(COMPREHENSIVE) NOTE  Jennifer Peters is a 26 y.o. [phone] at [redacted]w[redacted]d by best clinical estimate who is admitted for pre-eclampsia with severe features.   Fetal presentation is cephalic. Length of Stay:  5  Days  Subjective: Feels well. Denies headache or RUQ pain. Reports swelling in hands and legs Patient reports the fetal movement as active. Patient reports uterine contraction  activity as none. Patient reports  vaginal bleeding as none. Patient describes fluid per vagina as None.  Vitals:  Blood pressure 126/86, pulse 89, temperature 98.4 F (36.9 C), temperature source Oral, resp. rate 18, height  (1.575 m), weight 160 lb (72.6 kg), last menstrual period 02/29/2016, SpO2 97 %. Physical Examination:  General appearance - alert, well appearing, and in no distress Chest - normal effort Abdomen - gravid, NT Fundal Height:  size equals dates Extremities: Homans sign is negative, no sign of DVT  Membranes:intact  Fetal Monitoring:  Baseline: 145 bpm, Variability: Good {> 6 bpm), Accelerations: Non-reactive but appropriate for gestational age and Decelerations: Absent  Labs:  Results for orders placed or performed during the hospital encounter of 09/11/16 (from the past 24 hour(s))  CBC   Collection Time: 09/15/16  2:51 PM  Result Value Ref Range   WBC 11.2 (H) 4.0 - 10.5 K/uL   RBC 2.86 (L) 3.87 - 5.11 MIL/uL   Hemoglobin 10.4 (L) 12.0 - 15.0 g/dL   HCT 62.1 (L) 30.8 - 65.7 %   MCV 104.9 (H) 78.0 - 100.0 fL   MCH 36.4 (H) 26.0 - 34.0 pg   MCHC 34.7 30.0 - 36.0 g/dL   RDW 84.6 96.2 - 95.2 %   Platelets 142 (L) 150 - 400 K/uL  Comprehensive metabolic panel   Collection Time: 09/15/16  2:51 PM  Result Value Ref Range   Sodium 134 (L) 135 - 145 mmol/L   Potassium 4.6 3.5 - 5.1 mmol/L   Chloride 108 101 - 111 mmol/L   CO2 20 (L) 22 - 32 mmol/L   Glucose, Bld 101 (H) 65 - 99 mg/dL   BUN 11 6 - 20 mg/dL   Creatinine, Ser 8.41 0.44 - 1.00 mg/dL   Calcium 8.4 (L) 8.9 - 10.3 mg/dL   Total Protein 6.1 (L) 6.5 - 8.1 g/dL   Albumin 2.8 (L) 3.5 - 5.0 g/dL   AST 73 (H) 15 - 41 U/L   ALT 68 (H) 14 - 54 U/L   Alkaline Phosphatase 77 38 - 126 U/L   Total Bilirubin 0.4 0.3 - 1.2 mg/dL   GFR calc non Af Amer >60 >60 mL/min   GFR calc Af Amer >60 >60 mL/min   Anion gap 6 5 - 15  CBC   Collection Time: 09/16/16  5:10 AM  Result Value Ref Range   WBC 11.3 (H) 4.0 - 10.5 K/uL   RBC 2.97 (L) 3.87 - 5.11 MIL/uL   Hemoglobin 10.4 (L) 12.0 - 15.0 g/dL   HCT 32.4 (L) 40.1 - 02.7 %   MCV 106.1 (H) 78.0 - 100.0 fL   MCH 35.0 (H) 26.0 - 34.0 pg   MCHC 33.0 30.0 - 36.0 g/dL   RDW 25.3 66.4 - 40.3 %   Platelets 161 150 - 400 K/uL  Comprehensive metabolic panel   Collection Time: 09/16/16  5:10 AM  Result Value Ref Range   Sodium 133 (L) 135 - 145 mmol/L   Potassium 4.8 3.5 - 5.1 mmol/L   Chloride 106 101 - 111 mmol/L   CO2  20 (L) 22 - 32 mmol/L   Glucose, Bld 78 65 - 99 mg/dL   BUN 12 6 - 20 mg/dL   Creatinine, Ser 2.95 0.44 - 1.00 mg/dL   Calcium 8.5 (L) 8.9 - 10.3 mg/dL   Total Protein 6.1 (L) 6.5 - 8.1 g/dL   Albumin 2.8 (L) 3.5 - 5.0 g/dL   AST 61 (H) 15 - 41 U/L   ALT 65 (H) 14 - 54 U/L   Alkaline Phosphatase 88 38 - 126 U/L   Total Bilirubin 0.5 0.3 - 1.2 mg/dL   GFR calc non Af Amer >60 >60 mL/min   GFR calc Af Amer >60 >60 mL/min   Anion gap 7 5 - 15      Medications:  Scheduled . aspirin  81 mg Oral Daily  . docusate sodium  100 mg Oral Daily  . labetalol  800 mg Oral Q8H  . prenatal multivitamin  1 tablet Oral Q1200   I have reviewed the patient's current medications.  ASSESSMENT: Patient Active Problem List   Diagnosis Date Noted  . Severe preeclampsia, third trimester 09/11/2016  . Supervision of high risk pregnancy in third trimester 05/02/2016    PLAN: Inpatient observation with serial labs Discussed the plan for inpatient until delivery Labs stable today-LFT's in the 60s, Cr is 0.75 BPs improved with  increased Labetalol dosing. s/p BMZ on 4/5 and 4/7--consider repeat after 2 wks  Reva Bores, MD 09/16/2016,8:59 AM

## 2016-09-16 NOTE — Progress Notes (Signed)
Jennifer Peters is a 26 y.o. [phone] at [redacted]w[redacted]d with severe pre E  Subjective: Pt feels some cramping.    Objective: BP 126/79   Pulse 86   Temp 98.5 F (36.9 C) (Oral)   Resp 16   Ht  (1.575 m)   Wt 160 lb (72.6 kg)   LMP 02/29/2016 (Approximate)   SpO2 98%   BMI 29.26 kg/m  No intake/output data recorded. No intake/output data recorded.  FHT:  145, moserate variability, 10 x10 accelerations.  Decelerations to 60 several times.  Pt placed on left side, IV bolus, and O2 placed.  FHT improved.  Labs: Lab Results  Component Value Date   WBC 11.3 (H) 09/16/2016   HGB 10.4 (L) 09/16/2016   HCT 31.5 (L) 09/16/2016   MCV 106.1 (H) 09/16/2016   PLT 161 09/16/2016    Assessment / Plan: Discussed need for c/s if FHT worsens The risks of cesarean section discussed with the patient included but were not limited to: bleeding which may require transfusion or reoperation; infection which may require antibiotics; injury to bowel, bladder, ureters or other surrounding organs; injury to the fetus; need for additional procedures including hysterectomy in the event of a life-threatening hemorrhage; placental abnormalities wth subsequent pregnancies, incisional problems, thromboembolic phenomenon and other postoperative/anesthesia complications. The patient concurred with the proposed plan, giving informed written consent for the procedure.  At this time C/S is not necessary.  OR team aware (latex allergy).   NPO for now Continuous monitoring.    Jennifer Peters 09/16/2016, 5:40 PM

## 2016-09-17 MED ORDER — LABETALOL HCL 300 MG PO TABS
600.0000 mg | ORAL_TABLET | Freq: Three times a day (TID) | ORAL | Status: DC
  Administered 2016-09-17 – 2016-09-20 (×8): 600 mg via ORAL
  Filled 2016-09-17 (×13): qty 2

## 2016-09-17 NOTE — Progress Notes (Signed)
OB Note Late entry for 1130am D/w MFM Dr. Ezzard Standing and he called me back after reading the BPP and UA dopplers from yesterday. As of approximately 1230pm, showed no decels since the one I was called in for at approx 0930; accels at 1028 and 11am. He recommend repeat dopplers and bid NSTs. Will continue to follow patient closely.   Cornelia Copa MD Attending Center for [employer] (Faculty Practice) 09/17/2016 Time: [phone]

## 2016-09-17 NOTE — Progress Notes (Signed)
Patient ID: Jennifer Peters, female   DOB: 1991-03-29, 26 y.o.   MRN: [id number] FACULTY PRACTICE ANTEPARTUM(COMPREHENSIVE) NOTE  Jennifer Peters is a 26 y.o. [id number] at [redacted]w[redacted]d by best clinical estimate who is admitted for pre-eclampsia with severe features.   Fetal presentation is cephalic. Length of Stay:  6  Days  Subjective: Denies headache, vision changes or RUQ pain. Patient reports the fetal movement as active. Patient reports uterine contraction  activity as none. Patient reports  vaginal bleeding as none. Patient describes fluid per vagina as None.  Vitals:  Blood pressure 111/68, pulse 90, temperature 98.4 F (36.9 C), temperature source Oral, resp. rate 18, height  (1.575 m), weight 155 lb 12 oz (70.6 kg), last menstrual period 02/29/2016, SpO2 96 %. Physical Examination:  General appearance - alert, well appearing, and in no distress Chest - normal effort Abdomen - gravid, Non-tender Neuro - no clonus DTR's 2+ Fundal Height:  size equals dates Extremities: edema 2+ and Homans sign is negative, no sign of DVT  Membranes:intact  Fetal Monitoring:  Baseline: 140 bpm, Variability: Good {> 6 bpm), Accelerations: Non-reactive but appropriate for gestational age and Decelerations: Variable: severe  Labs:  No results found for this or any previous visit (from the past 24 hour(s)).  U/S: BPP 8/8 and abnormal dopplers with AEDF--not an official read  Medications:  Scheduled . aspirin  81 mg Oral Daily  . docusate sodium  100 mg Oral Daily  . labetalol  600 mg Oral Q8H  . prenatal multivitamin  1 tablet Oral Q1200   I have reviewed the patient's current medications.  ASSESSMENT: Patient Active Problem List   Diagnosis Date Noted  . Severe preeclampsia, third trimester 09/11/2016  . Supervision of high risk pregnancy in third trimester 05/02/2016    PLAN: Decels noted but BP is now very low and ? Too low--will decrease her labetalol. Continue continuous monitoring. Labs  stable--re-check 2x/wk f/u MFM official u/s read.  Reva Bores, MD 09/17/2016,7:35 AM

## 2016-09-17 NOTE — Progress Notes (Signed)
Initial Nutrition Assessment  DOCUMENTATION CODES:   Not applicable  INTERVENTION:  Regular diet May order double protein portions, snacks TID and from retail  NUTRITION DIAGNOSIS:  Increased nutrient needs related to  (pregnancy and fetal growth requirements) as evidenced by  (29 4/7 weeks IUP).  GOAL:  Patient will meet greater than or equal to 90% of their needs  MONITOR:  Weight trends  REASON FOR ASSESSMENT:  Antenatal   ASSESSMENT:   Preeclampsia, 29 4/7 weeks. prepreg weight 135 lbs, BMI 24.7.  20 Lb weight gain. Reports good appetitie  Diet Order:  Diet regular Room service appropriate? Yes; Fluid consistency: Thin Skin:  Reviewed, no issues  Height:   Ht Readings from Last 1 Encounters:  09/11/16  (1.575 m)    Weight:   Wt Readings from Last 1 Encounters:  09/17/16 155 lb 12 oz (70.6 kg)    Ideal Body Weight:     BMI:  Body mass index is 28.49 kg/m.  Estimated Nutritional Needs:   Kcal:  1700-1900  Protein:  75-85 g  Fluid:  2 L  EDUCATION NEEDS:   No education needs identified at this time  Inez Pilgrim.Odis Luster LDN Neonatal Nutrition Support Specialist/RD III Pager [phone]      Phone [phone]

## 2016-09-18 ENCOUNTER — Encounter: Payer: Self-pay | Admitting: *Deleted

## 2016-09-18 ENCOUNTER — Inpatient Hospital Stay (HOSPITAL_COMMUNITY): Payer: Medicaid Other

## 2016-09-18 DIAGNOSIS — O365990 Maternal care for other known or suspected poor fetal growth, unspecified trimester, not applicable or unspecified: Secondary | ICD-10-CM

## 2016-09-18 DIAGNOSIS — R7401 Elevation of levels of liver transaminase levels: Secondary | ICD-10-CM | POA: Diagnosis not present

## 2016-09-18 DIAGNOSIS — R740 Nonspecific elevation of levels of transaminase and lactic acid dehydrogenase [LDH]: Secondary | ICD-10-CM

## 2016-09-18 DIAGNOSIS — D531 Other megaloblastic anemias, not elsewhere classified: Secondary | ICD-10-CM | POA: Diagnosis present

## 2016-09-18 LAB — COMPREHENSIVE METABOLIC PANEL
ALT: 41 U/L (ref 14–54)
AST: 28 U/L (ref 15–41)
Albumin: 3 g/dL — ABNORMAL LOW (ref 3.5–5.0)
Alkaline Phosphatase: 100 U/L (ref 38–126)
Anion gap: 7 (ref 5–15)
BUN: 19 mg/dL (ref 6–20)
CHLORIDE: 106 mmol/L (ref 101–111)
CO2: 21 mmol/L — AB (ref 22–32)
CREATININE: 0.89 mg/dL (ref 0.44–1.00)
Calcium: 9 mg/dL (ref 8.9–10.3)
GFR calc Af Amer: >60 mL/min (ref >60)
GFR calc non Af Amer: >60 mL/min (ref >60)
Glucose, Bld: 74 mg/dL (ref 65–99)
Potassium: 4.5 mmol/L (ref 3.5–5.1)
SODIUM: 134 mmol/L — AB (ref 135–145)
Total Bilirubin: 0.5 mg/dL (ref 0.3–1.2)
Total Protein: 6.4 g/dL — ABNORMAL LOW (ref 6.5–8.1)

## 2016-09-18 LAB — CBC
HCT: 33.2 % — ABNORMAL LOW (ref 36.0–46.0)
Hemoglobin: 10.9 g/dL — ABNORMAL LOW (ref 12.0–15.0)
MCH: 35.2 pg — ABNORMAL HIGH (ref 26.0–34.0)
MCHC: 32.8 g/dL (ref 30.0–36.0)
MCV: 107.1 fL — ABNORMAL HIGH (ref 78.0–100.0)
Platelets: 214 10*3/uL (ref 150–400)
RBC: 3.1 MIL/uL — ABNORMAL LOW (ref 3.87–5.11)
RDW: 12.6 % (ref 11.5–15.5)
WBC: 10.9 10*3/uL — ABNORMAL HIGH (ref 4.0–10.5)

## 2016-09-18 LAB — VITAMIN B12: VITAMIN B 12: 252 pg/mL (ref 180–914)

## 2016-09-18 LAB — FOLATE: FOLATE: 21.9 ng/mL (ref >5.9)

## 2016-09-18 NOTE — Progress Notes (Signed)
Patient off monitors. To MFM for ultrasound via wheelchair.

## 2016-09-18 NOTE — Progress Notes (Signed)
Notified provider of BP 116/69. Recheck BP at midnight if still low hold 2200 dose of Labetalol 

## 2016-09-18 NOTE — Progress Notes (Signed)
Daily Antepartum Note  Admission Date: 09/11/2016 Current Date: 09/18/2016 7:37 AM  Jennifer Peters is a 26 y.o. [id number] @ [redacted]w[redacted]d, HD#8, admitted for pre-eclampsia with severe features (BP, protein)  Pregnancy complicated by: Patient Active Problem List   Diagnosis Date Noted  . Transaminitis 09/18/2016  . 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  . Abnormal umbilical cord dopplers 09/18/2016  . Severe preeclampsia, third trimester 09/11/2016  . Supervision of high risk pregnancy in third trimester 05/02/2016    Overnight/24hr events:  None  Subjective:  No s/s of pre-eclampsia or decreased FM  Objective:    Current Vital Signs 24h Vital Sign Ranges  T 98.4 F (36.9 C) Temp  Avg: 98.5 F (36.9 C)  Min: 98.2 F (36.8 C)  Max: 98.9 F (37.2 C)  BP (!) 143/98 BP  Min: 109/61  Max: 152/102  HR 73 Pulse  Avg: 83.9  Min: 66  Max: 93  RR 17 Resp  Avg: 16.6  Min: 16  Max: 18  SaO2 98 % Not Delivered SpO2  Avg: 97.4 %  Min: 94 %  Max: 99 %       24 Hour I/O Current Shift I/O  Time Ins Outs No intake/output data recorded. No intake/output data recorded.   Patient Vitals for the past 24 hrs:  BP Temp Temp src Pulse Resp SpO2 Weight  09/18/16 0613 (!) 143/98 98.4 F (36.9 C) Oral 73 17 98 % -  09/18/16 0557 - - - - - - 69.4 kg (153 lb)  09/17/16 2348 122/90 98.6 F (37 C) Oral 86 16 94 % -  09/17/16 1942 (!) 127/92 98.3 F (36.8 C) Oral 92 16 99 % -  09/17/16 1736 118/79 - - 88 16 98 % -  09/17/16 1605 131/84 98.6 F (37 C) Oral 80 16 99 % -  09/17/16 1508 109/61 98.6 F (37 C) - 93 16 - -  09/17/16 1238 116/71 98.9 F (37.2 C) Oral 92 18 98 % -  09/17/16 0933 (!) 152/102 - - 66 - - -  09/17/16 0750 129/82 98.2 F (36.8 C) Oral 85 18 96 % -    Physical exam: General: Well nourished, well developed female in no acute distress. Abdomen: gravid nttp Cardiovascular: S1, S2 normal, no murmur, rub or gallop, regular rate and rhythm Respiratory: CTAB Extremities: no clubbing,  cyanosis or edema Skin: Warm and dry.   Medications: Current Facility-Administered Medications  Medication Dose Route Frequency Provider Last Rate Last Dose  . acetaminophen (TYLENOL) tablet 650 mg  650 mg Oral Q4H PRN Rhona Raider Stinson, DO   650 mg at 09/15/16 0535  . aspirin chewable tablet 81 mg  81 mg Oral Daily Levie Heritage, DO   81 mg at 09/17/16 1511  . butalbital-acetaminophen-caffeine (FIORICET, ESGIC) 50-325-40 MG per tablet 1 tablet  1 tablet Oral Q6H PRN Lesly Dukes, MD   1 tablet at 09/17/16 1257  . calcium carbonate (TUMS - dosed in mg elemental calcium) chewable tablet 400 mg of elemental calcium  2 tablet Oral Q4H PRN Rhona Raider Stinson, DO      . docusate sodium (COLACE) capsule 100 mg  100 mg Oral Daily Rhona Raider Stinson, DO   100 mg at 09/17/16 1511  . hydrALAZINE (APRESOLINE) injection 10 mg  10 mg Intravenous Once PRN Lesly Dukes, MD      . labetalol (NORMODYNE) tablet 600 mg  600 mg Oral Q8H Reva Bores, MD   600 mg at 09/18/16 4540  .  lactated ringers infusion   Intravenous Continuous Levie Heritage, DO   Stopped at 09/13/16 0740  . lactated ringers infusion   Intravenous Continuous Lesly Dukes, MD 20 mL/hr at 09/18/16 [id number]    . prenatal multivitamin tablet 1 tablet  1 tablet Oral Q1200 Rhona Raider Stinson, DO   1 tablet at 09/17/16 1511  . zolpidem (AMBIEN) tablet 5 mg  5 mg Oral QHS PRN Levie Heritage, DO        Labs:   Recent Labs Lab 09/15/16 1451 09/16/16 0510 09/18/16 0531  WBC 11.2* 11.3* 10.9*  HGB 10.4* 10.4* 10.9*  HCT 30.0* 31.5* 33.2*  PLT 142* 161 214     Recent Labs Lab 09/15/16 1451 09/16/16 0510 09/18/16 0531  NA 134* 133* 134*  K 4.6 4.8 4.5  CL 108 106 106  CO2 20* 20* 21*  BUN CREATININE 0.73 0.75 0.89  CALCIUM 8.4* 8.5* 9.0  PROT 6.1* 6.1* 6.4*  BILITOT 0.4 0.5 0.5  ALKPHOS 77 88 100  ALT 68* 65* 41  AST 73* 61* 28  GLUCOSE 101* 78 74     Radiology: no new imaging 4/10: normal AFI, ceph, 28%, 1089gm,  AC 12% 4/15: BPP 8/8, AEDF with intermittent REDF  Assessment & Plan:  Pt stable *Pregnancy: rNST o/n. NR-NST this morning but category I. Will add on BPP to UA dopplers this AM *Severe pre-eclampsia: continue with 2-3x/week labs. Continue current BP regimen *Preterm: sp BMZ on 4/5 and 4/7. NICU aware *abnormal UA dopplers: f/u u/s for this AM and MFM recs *PPx: OOB ad lib *FEN/GI: clear diet and MIVF will awaiting u/s *Dispo: 34wks at the latest but depending on antenatal testing.   Cornelia Copa MD Attending Center for [facility])

## 2016-09-19 ENCOUNTER — Encounter: Payer: Medicaid Other | Admitting: Certified Nurse Midwife

## 2016-09-19 DIAGNOSIS — O365990 Maternal care for other known or suspected poor fetal growth, unspecified trimester, not applicable or unspecified: Secondary | ICD-10-CM

## 2016-09-19 DIAGNOSIS — O0993 Supervision of high risk pregnancy, unspecified, third trimester: Secondary | ICD-10-CM

## 2016-09-19 DIAGNOSIS — R740 Nonspecific elevation of levels of transaminase and lactic acid dehydrogenase [LDH]: Secondary | ICD-10-CM

## 2016-09-19 MED ORDER — ONDANSETRON HCL 4 MG/2ML IJ SOLN
4.0000 mg | Freq: Four times a day (QID) | INTRAMUSCULAR | Status: DC | PRN
  Administered 2016-09-19: 4 mg via INTRAVENOUS
  Filled 2016-09-19: qty 2

## 2016-09-19 NOTE — Progress Notes (Signed)
Patient ID: Jennifer Peters, female   DOB: 11/02/1990, 26 y.o.   MRN: [id number] FACULTY PRACTICE ANTEPARTUM(COMPREHENSIVE) NOTE  Jennifer Peters is a 26 y.o. [id number] at [redacted]w[redacted]d by best clinical estimate who is admitted for pre-eclampsia with severe features.   Fetal presentation is cephalic. Length of Stay:  8  Days  Subjective: Denies headache, vision changes or RUQ pain. She reports nausea without emesis this morning Patient reports the fetal movement as active. Patient reports uterine contraction  activity as none. Patient reports  vaginal bleeding as none. Patient describes fluid per vagina as None.  Vitals:  Blood pressure 113/79, pulse 82, temperature 98.5 F (36.9 C), resp. rate 16, height  (1.575 m), weight 153 lb (69.4 kg), last menstrual period 02/29/2016, SpO2 99 %. Physical Examination:  General appearance - alert, well appearing, and in no distress Chest - normal effort Abdomen - gravid, Non-tender Neuro - no clonus DTR's 2+ Fundal Height:  size equals dates Extremities: edema 2+ and Homans sign is negative, no sign of DVT  Membranes:intact  Fetal Monitoring:  Baseline: 140 bpm, Variability: Good {> 6 bpm), Accelerations: Non-reactive but appropriate for gestational age and Decelerations: Variable: mild  Labs:  No results found for this or any previous visit (from the past 24 hour(s)).  U/S: 4/17 BPP 8/8 and abnormal dopplers with AEDF  Medications:  Scheduled . aspirin  81 mg Oral Daily  . docusate sodium  100 mg Oral Daily  . labetalol  600 mg Oral Q8H  . prenatal multivitamin  1 tablet Oral Q1200   I have reviewed the patient's current medications.  ASSESSMENT: Patient Active Problem List   Diagnosis Date Noted  . Transaminitis 09/18/2016  . 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  . Abnormal umbilical cord dopplers 09/18/2016  . Severe preeclampsia, third trimester 09/11/2016  . Supervision of high risk pregnancy in third trimester 05/02/2016    PLAN: -patient  normotensive. Labetalol held yesterday evening and this morning - AEDF on ultrasound yesterday- plan for repeat later this week - continue close monitoring  Catalina Antigua, MD 09/19/2016,7:01 AM

## 2016-09-20 DIAGNOSIS — Z3A30 30 weeks gestation of pregnancy: Secondary | ICD-10-CM

## 2016-09-20 DIAGNOSIS — O1413 Severe pre-eclampsia, third trimester: Secondary | ICD-10-CM

## 2016-09-20 LAB — CBC
HCT: 32.6 % — ABNORMAL LOW (ref 36.0–46.0)
Hemoglobin: 10.8 g/dL — ABNORMAL LOW (ref 12.0–15.0)
MCH: 34.8 pg — ABNORMAL HIGH (ref 26.0–34.0)
MCHC: 33.1 g/dL (ref 30.0–36.0)
MCV: 105.2 fL — AB (ref 78.0–100.0)
PLATELETS: 273 10*3/uL (ref 150–400)
RBC: 3.1 MIL/uL — AB (ref 3.87–5.11)
RDW: 13.4 % (ref 11.5–15.5)
WBC: 11.3 10*3/uL — ABNORMAL HIGH (ref 4.0–10.5)

## 2016-09-20 MED ORDER — LABETALOL HCL 200 MG PO TABS
400.0000 mg | ORAL_TABLET | Freq: Three times a day (TID) | ORAL | Status: DC
  Administered 2016-09-20 – 2016-09-24 (×14): 400 mg via ORAL
  Filled 2016-09-20 (×2): qty 2
  Filled 2016-09-20: qty 4
  Filled 2016-09-20 (×11): qty 2

## 2016-09-20 MED ORDER — GLYCERIN (LAXATIVE) 2.1 G RE SUPP
1.0000 | RECTAL | Status: DC | PRN
  Administered 2016-09-20: 1 via RECTAL
  Filled 2016-09-20 (×2): qty 1

## 2016-09-20 NOTE — Progress Notes (Signed)
Patient ID: Jennifer Peters, female   DOB: 04-13-1991, 26 y.o.   MRN: [id number] FACULTY PRACTICE ANTEPARTUM(COMPREHENSIVE) NOTE  Jennifer Peters is a 26 y.o. [id number] with Estimated Date of Delivery: 11/29/16   By  early ultrasound [redacted]w[redacted]d  who is admitted for severe pre eclampsia.    Fetal presentation is cephalic. Length of Stay:  9  Days  Date of admission:09/11/2016  Subjective: No headache or visual changes Patient reports the fetal movement as active. Patient reports uterine contraction  activity as none. Patient reports  vaginal bleeding as none. Patient describes fluid per vagina as None.  Vitals:  Blood pressure 122/79, pulse 76, temperature 98.5 F (36.9 C), temperature source Oral, resp. rate 20, height  (1.575 m), weight 153 lb (69.4 kg), last menstrual period 02/29/2016, SpO2 99 %. Vitals:   09/19/16 1945 09/19/16 2223 09/20/16 0419 09/20/16 0802  BP: 114/71 118/85 (!) 133/93 122/79  Pulse: 81 75 74 76  Resp: Temp: 98.2 F (36.8 C)   98.5 F (36.9 C)  TempSrc: Oral   Oral  SpO2: 100%   99%  Weight:      Height:       Physical Examination:  General appearance - alert, well appearing, and in no distress Abdomen - soft, nontender, nondistended, no masses or organomegaly Fundal Height:  size equals dates PExtremities: extremities normal, atraumatic, no cyanosis or edema with DTRs 2+ bilaterally Membranes:intact  Fetal Monitoring:  Baseline: 135 bpm, Variability: Fair (1-6 bpm), Accelerations: Non-reactive but appropriate for gestational age and Decelerations: Variable: moderate     Labs:  Results for orders placed or performed during the hospital encounter of 09/11/16 (from the past 24 hour(s))  CBC   Collection Time: 09/20/16  8:48 AM  Result Value Ref Range   WBC 11.3 (H) 4.0 - 10.5 K/uL   RBC 3.10 (L) 3.87 - 5.11 MIL/uL   Hemoglobin 10.8 (L) 12.0 - 15.0 g/dL   HCT 13.0 (L) 86.5 - 78.4 %   MCV 105.2 (H) 78.0 - 100.0 fL   MCH 34.8 (H) 26.0 - 34.0 pg   MCHC 33.1 30.0 - 36.0 g/dL   RDW 69.6 29.5 - 28.4 %   Platelets 273 150 - 400 K/uL    Imaging Studies:    None new  Medications:  Scheduled . aspirin  81 mg Oral Daily  . docusate sodium  100 mg Oral Daily  . labetalol  600 mg Oral Q8H  . prenatal multivitamin  1 tablet Oral Q1200   I have reviewed the patient's current medications.  ASSESSMENT: [id number] [redacted]w[redacted]d Estimated Date of Delivery: 11/29/16  Severe pre eclampsia, stable Fetal Doppler flow concerns, absent EDF with occasiaonl reverse flow seen  PLAN: Close continuous fetal observatin Repeat dopplers tomorrow  Team notified of FHR pattern and team and patient understand deterioration could result in an emergent Caesarean delivery, see last night's note  [name] 09/20/2016,11:43 AM

## 2016-09-20 NOTE — Progress Notes (Signed)
Called to see patient: Fetal Heart rate decelerations  [redacted]w[redacted]d Estimated Date of Delivery: 11/29/16 Severe pre eclampsia, improved Absent EDF with occasional reverse noted  Routine monitoring reveals spontaneous moderate variable decelerations with return to baseline, no late decelerations are noted  Talked with patient at length regarding the cord flow dynamics of her baby, she understands baby has a very narrow reserve profile and at any time may show patterns which would warrant delivery Fluid bolus given position changed and FHR pattern is improved Continue continuous monitoring for now OR and anesthesia notified of fetal concerns and are at the ready if needed  Lazaro Arms, MD 09/20/2016 11:42 AM

## 2016-09-21 ENCOUNTER — Inpatient Hospital Stay (HOSPITAL_COMMUNITY): Payer: Medicaid Other

## 2016-09-21 LAB — TYPE AND SCREEN
ABO/RH(D): A POS
ABO/RH(D): A POS
ANTIBODY SCREEN: NEGATIVE
Antibody Screen: NEGATIVE

## 2016-09-21 NOTE — Progress Notes (Signed)
Pt to MFM U/S for U/S doppler studies.

## 2016-09-21 NOTE — Progress Notes (Signed)
Pt's children @ bedside without adult supervision.  Pt informed children must have adult supervision other than herself secondary if emergency occurred children would be unsupervised.  Pt verbalized understanding and agreeable.

## 2016-09-21 NOTE — Progress Notes (Signed)
Pt instructed to tell RN if ctxs become more intense.  Pt verbalized understanding & agreeable.

## 2016-09-21 NOTE — Progress Notes (Signed)
ACULTY PRACTICE ANTEPARTUM COMPREHENSIVE PROGRESS NOTE  Jennifer Peters is a 26 y.o. [phone] at [redacted]w[redacted]d  who is admitted for severe preeclampsia.   Fetal presentation is cephalic. Length of Stay:  10  Days  Subjective: Pt without complaints this morning. Denies HA or visual changes Patient reports good fetal movement.  She reports no uterine contractions, no bleeding and no loss of fluid per vagina.  Vitals:  Blood pressure 138/83, pulse 61, temperature 98.6 F (37 C), temperature source Oral, resp. rate 16, height  (1.575 m), weight 69.4 kg (153 lb), last menstrual period 02/29/2016, SpO2 100 %.   Physical Examination: Lungs clear Heart RRR Abd soft + BS gravid Ext non tender  Fetal Monitoring:  Baseline: 130's-140's, fair variablity, occ 10 x 10 accelerations, non reactive bpm  Labs:  Results for orders placed or performed during the hospital encounter of 09/11/16 (from the past 24 hour(s))  CBC   Collection Time: 09/20/16  8:48 AM  Result Value Ref Range   WBC 11.3 (H) 4.0 - 10.5 K/uL   RBC 3.10 (L) 3.87 - 5.11 MIL/uL   Hemoglobin 10.8 (L) 12.0 - 15.0 g/dL   HCT 65.7 (L) 84.6 - 96.2 %   MCV 105.2 (H) 78.0 - 100.0 fL   MCH 34.8 (H) 26.0 - 34.0 pg   MCHC 33.1 30.0 - 36.0 g/dL   RDW 95.2 84.1 - 32.4 %   Platelets 273 150 - 400 K/uL    Imaging Studies:    none   Medications:  Scheduled . aspirin  81 mg Oral Daily  . docusate sodium  100 mg Oral Daily  . labetalol  400 mg Oral Q8H  . prenatal multivitamin  1 tablet Oral Q1200   I have reviewed the patient's current medications.  ASSESSMENT: IUP 30 1/7 weeks Severe PEC Abnormal fetal dopplers  PLAN: Fetal heart tracing improved over last 24 hrs. Doppler studies today. BP stable. Continue with labetalol and follow BP's Continue routine antenatal care.   Hermina Staggers 09/21/2016,6:18 AM

## 2016-09-21 NOTE — Progress Notes (Signed)
Dr. Shawnie Pons informed pt starting to have ctxs q 3.5-7.5 minutes apart for the last 35 min but unable to feel them.

## 2016-09-22 LAB — COMPREHENSIVE METABOLIC PANEL
ALK PHOS: 96 U/L (ref 38–126)
ALT: 42 U/L (ref 14–54)
AST: 32 U/L (ref 15–41)
Albumin: 2.5 g/dL — ABNORMAL LOW (ref 3.5–5.0)
Anion gap: 7 (ref 5–15)
BUN: 9 mg/dL (ref 6–20)
CALCIUM: 8.5 mg/dL — AB (ref 8.9–10.3)
CHLORIDE: 107 mmol/L (ref 101–111)
CO2: 22 mmol/L (ref 22–32)
CREATININE: 0.78 mg/dL (ref 0.44–1.00)
GFR calc non Af Amer: >60 mL/min (ref >60)
Glucose, Bld: 77 mg/dL (ref 65–99)
Potassium: 3.8 mmol/L (ref 3.5–5.1)
SODIUM: 136 mmol/L (ref 135–145)
Total Bilirubin: 0.3 mg/dL (ref 0.3–1.2)
Total Protein: 5.6 g/dL — ABNORMAL LOW (ref 6.5–8.1)

## 2016-09-22 LAB — CBC
HCT: 30 % — ABNORMAL LOW (ref 36.0–46.0)
HEMOGLOBIN: 10.4 g/dL — AB (ref 12.0–15.0)
MCH: 35.6 pg — AB (ref 26.0–34.0)
MCHC: 34.7 g/dL (ref 30.0–36.0)
MCV: 102.7 fL — ABNORMAL HIGH (ref 78.0–100.0)
PLATELETS: 229 10*3/uL (ref 150–400)
RBC: 2.92 MIL/uL — AB (ref 3.87–5.11)
RDW: 13.3 % (ref 11.5–15.5)
WBC: 11.2 10*3/uL — AB (ref 4.0–10.5)

## 2016-09-22 NOTE — Progress Notes (Signed)
MD notified of FHR change. MD reviewed strip and ordered a 500cc bolus. Pt laying on left side. Continue to monitor.

## 2016-09-22 NOTE — Progress Notes (Signed)
Patient ID: Jennifer Peters, female   DOB: 20-Nov-1990, 26 y.o.   MRN: [id number] FACULTY PRACTICE ANTEPARTUM(COMPREHENSIVE) NOTE  Marcel Sorter is a 26 y.o. [id number] at [redacted]w[redacted]d by best clinical estimate who is admitted for pre-eclampsia with severe features.   Fetal presentation is unsure. Length of Stay:  11  Days  Subjective: Patient had u/s yesterday that showed intermittent REDF, mostly AEDF. BP is stable Patient reports the fetal movement as active. Patient reports uterine contraction  activity as none. Patient reports  vaginal bleeding as none. Patient describes fluid per vagina as None.  Vitals:  Blood pressure 118/84, pulse 79, temperature 98.6 F (37 C), temperature source Oral, resp. rate 16, height  (1.575 m), weight 153 lb (69.4 kg), last menstrual period 02/29/2016, SpO2 99 %. Physical Examination:  General appearance - alert, well appearing, and in no distress Chest - normal effort Abdomen - gravid, NT Fundal Height:  size equals dates Extremities: Homans sign is negative, no sign of DVT  Membranes:intact  Fetal Monitoring:  Baseline: 145 bpm, Variability: Good {> 6 bpm), Accelerations: Non-reactive but appropriate for gestational age and Decelerations: Variable: moderate  Labs:  Results for orders placed or performed during the hospital encounter of 09/11/16 (from the past 24 hour(s))  CBC   Collection Time: 09/22/16  5:21 AM  Result Value Ref Range   WBC 11.2 (H) 4.0 - 10.5 K/uL   RBC 2.92 (L) 3.87 - 5.11 MIL/uL   Hemoglobin 10.4 (L) 12.0 - 15.0 g/dL   HCT 34.7 (L) 42.5 - 95.6 %   MCV 102.7 (H) 78.0 - 100.0 fL   MCH 35.6 (H) 26.0 - 34.0 pg   MCHC 34.7 30.0 - 36.0 g/dL   RDW 38.7 56.4 - 33.2 %   Platelets 229 150 - 400 K/uL  Comprehensive metabolic panel   Collection Time: 09/22/16  5:21 AM  Result Value Ref Range   Sodium 136 135 - 145 mmol/L   Potassium 3.8 3.5 - 5.1 mmol/L   Chloride 107 101 - 111 mmol/L   CO2 22 22 - 32 mmol/L   Glucose, Bld 77 65 - 99  mg/dL   BUN 9 6 - 20 mg/dL   Creatinine, Ser 9.51 0.44 - 1.00 mg/dL   Calcium 8.5 (L) 8.9 - 10.3 mg/dL   Total Protein 5.6 (L) 6.5 - 8.1 g/dL   Albumin 2.5 (L) 3.5 - 5.0 g/dL   AST 32 15 - 41 U/L   ALT 42 14 - 54 U/L   Alkaline Phosphatase 96 38 - 126 U/L   Total Bilirubin 0.3 0.3 - 1.2 mg/dL   GFR calc non Af Amer >60 >60 mL/min   GFR calc Af Amer >60 >60 mL/min   Anion gap 7 5 - 15    Medications:  Scheduled . aspirin  81 mg Oral Daily  . docusate sodium  100 mg Oral Daily  . labetalol  400 mg Oral Q8H  . prenatal multivitamin  1 tablet Oral Q1200   I have reviewed the patient's current medications.  ASSESSMENT: Principal Problem:   Severe preeclampsia, third trimester Active Problems:   Supervision of high risk pregnancy in third trimester   Transaminitis   Megaloblastic anemia   Abnormal umbilical cord dopplers   PLAN: Continue inpatient management Repeat dopplers tomorrow Continuous Fetal monitoring Normal labs today Labetalol 400 mg po tid  Reva Bores, MD 09/22/2016,7:52 AM

## 2016-09-23 ENCOUNTER — Inpatient Hospital Stay (HOSPITAL_COMMUNITY): Payer: Medicaid Other

## 2016-09-23 DIAGNOSIS — Z3A30 30 weeks gestation of pregnancy: Secondary | ICD-10-CM

## 2016-09-23 MED ORDER — SODIUM CHLORIDE 0.9% FLUSH: 3.0000 mL | INTRAVENOUS | Status: DC | PRN

## 2016-09-23 MED ORDER — BETAMETHASONE SOD PHOS & ACET 6 (3-3) MG/ML IJ SUSP
12.0000 mg | INTRAMUSCULAR | Status: AC
  Administered 2016-09-23 – 2016-09-24 (×2): 12 mg via INTRAMUSCULAR
  Filled 2016-09-23 (×2): qty 2

## 2016-09-23 MED ORDER — SODIUM CHLORIDE 0.9% FLUSH
3.0000 mL | Freq: Two times a day (BID) | INTRAVENOUS | Status: DC
  Administered 2016-09-23 – 2016-09-24 (×3): 3 mL via INTRAVENOUS

## 2016-09-23 NOTE — Progress Notes (Signed)
Patient ID: Jennifer Peters, female   DOB: [birth year], 26 y.o.   MRN: [id number] FACULTY PRACTICE ANTEPARTUM(COMPREHENSIVE) NOTE  Jennifer Peters is a 26 y.o. [id number] at [redacted]w[redacted]d by best clinical estimate who is admitted for pre-eclampsia with severe features.   Fetal presentation is unsure. Length of Stay:  12  Days  Subjective: Patient reports feeling well. She denies headaches, visual changes, RUQ/epigastric pain Patient reports the fetal movement as active. Patient reports uterine contraction  activity as none. Patient reports  vaginal bleeding as none. Patient describes fluid per vagina as None.  Vitals:  Blood pressure 113/72, pulse 81, temperature 98.1 F (36.7 C), temperature source Oral, resp. rate 18, height  (1.575 m), weight 153 lb (69.4 kg), last menstrual period 02/29/2016, SpO2 98 %. Physical Examination:  General appearance - alert, well appearing, and in no distress Chest - normal effort Abdomen - gravid, NT Fundal Height:  size equals dates Extremities: Homans sign is negative, no sign of DVT  Membranes:intact  Fetal Monitoring:  Baseline: 145 bpm, Variability: Fair (1-6 bpm), Accelerations: Non-reactive but appropriate for gestational age and Decelerations: Variable: moderate  Labs:  No results found for this or any previous visit (from the past 24 hour(s)).  Medications:  Scheduled . aspirin  81 mg Oral Daily  . betamethasone acetate-betamethasone sodium phosphate  12 mg Intramuscular Q24 Hr x 2  . docusate sodium  100 mg Oral Daily  . labetalol  400 mg Oral Q8H  . prenatal multivitamin  1 tablet Oral Q1200   I have reviewed the patient's current medications.  ASSESSMENT: Principal Problem:   Severe preeclampsia, third trimester Active Problems:   Supervision of high risk pregnancy in third trimester   Transaminitis   Megaloblastic anemia   Abnormal umbilical cord dopplers   PLAN: - BP well controlled on labetalol 400 TID - Follow up fetal dopplers  today - Continuous fetal heart tracing given frequency of occurrence of variables - Repeat course of betamethasone for fetal lung maturity - Continue close observation   Catalina Antigua, MD 09/23/2016,7:34 AM

## 2016-09-24 LAB — TYPE AND SCREEN
ABO/RH(D): A POS
ANTIBODY SCREEN: NEGATIVE

## 2016-09-24 NOTE — Progress Notes (Signed)
Patient ID: Jennifer Peters, female   DOB: 02/22/1991, 26 y.o.   MRN: [id number] FACULTY PRACTICE ANTEPARTUM(COMPREHENSIVE) NOTE  Jennifer Peters is a 26 y.o. [id number] at [redacted]w[redacted]d by best clinical estimate who is admitted for pre-eclampsia with severe features.   Fetal presentation is unsure. Length of Stay:  13  Days  Subjective: Patient reports feeling well. She denies headaches, visual changes, RUQ/epigastric pain Patient reports the fetal movement as active. Patient reports uterine contraction  activity as none. Patient reports  vaginal bleeding as none. Patient describes fluid per vagina as None.  Vitals:  Blood pressure (!) 143/96, pulse 84, temperature 98.3 F (36.8 C), temperature source Oral, resp. rate 18, height  (1.575 m), weight 153 lb (69.4 kg), last menstrual period 02/29/2016, SpO2 99 %. Physical Examination: General appearance - alert, well appearing, and in no distress Chest - normal effort Abdomen - gravid, NT Fundal Height:  size equals dates Extremities: Homans sign is negative, no sign of DVT  Membranes:intact  Fetal Monitoring:  Baseline: 145 bpm, Variability: Fair (1-6 bpm), Accelerations: Non-reactive but appropriate for gestational age and Decelerations: Variable: moderate  Has one-two variable decels/hour 09/23/16: BPP 8/8, AEDF  Labs:  No results found for this or any previous visit (from the past 24 hour(s)).  Medications:  Scheduled . aspirin  81 mg Oral Daily  . docusate sodium  100 mg Oral Daily  . labetalol  400 mg Oral Q8H  . prenatal multivitamin  1 tablet Oral Q1200  . sodium chloride flush  3 mL Intravenous Q12H   I have reviewed the patient's current medications.  ASSESSMENT: Principal Problem:   Severe preeclampsia, third trimester Active Problems:   Supervision of high risk pregnancy in third trimester   Transaminitis   Megaloblastic anemia   Abnormal umbilical cord dopplers   PLAN: - BP well controlled on labetalol 400 TID - Continue  serial BPP, UA dopplers as per MFM - Continuous fetal heart tracing given frequency of occurrence of variables - Repeating course of betamethasone for fetal lung maturity; 2nd dose today - Continue close observation   Jaynie Collins, MD 09/24/2016,8:01 AM

## 2016-09-24 NOTE — Progress Notes (Signed)
Pt left antecubital old IV site is reddened and skin is mottled will continue to monitor.

## 2016-09-24 NOTE — Progress Notes (Signed)
INitial visit with Jennifer Peters to introduce spiritual care services and offer support during her hospital stay.  When I knocked, pt was facing the wall and appeared to be trying to rest.  I offered to come back later, which she accepted.  Please page as further needs arise.  Maryanna Shape. Carley Hammed, M.Div. [facility] Chaplain Pager [phone] Office [phone]

## 2016-09-25 ENCOUNTER — Encounter (HOSPITAL_COMMUNITY): Payer: Self-pay | Admitting: Anesthesiology

## 2016-09-25 ENCOUNTER — Inpatient Hospital Stay (HOSPITAL_COMMUNITY): Payer: Medicaid Other | Admitting: Anesthesiology

## 2016-09-25 ENCOUNTER — Encounter (HOSPITAL_COMMUNITY)
Admission: AD | Disposition: A | Discharge status: Home / Self Care | Payer: Self-pay | Source: Ambulatory Visit | Attending: Family Medicine

## 2016-09-25 DIAGNOSIS — Z3A30 30 weeks gestation of pregnancy: Secondary | ICD-10-CM

## 2016-09-25 DIAGNOSIS — O1414 Severe pre-eclampsia complicating childbirth: Secondary | ICD-10-CM

## 2016-09-25 LAB — CBC
HCT: 31.3 % — ABNORMAL LOW (ref 36.0–46.0)
HCT: 27.7 % — ABNORMAL LOW (ref 36.0–46.0)
HEMOGLOBIN: 10.9 g/dL — AB (ref 12.0–15.0)
Hemoglobin: 9.4 g/dL — ABNORMAL LOW (ref 12.0–15.0)
MCH: 36.2 pg — AB (ref 26.0–34.0)
MCH: 35.5 pg — ABNORMAL HIGH (ref 26.0–34.0)
MCHC: 34.8 g/dL (ref 30.0–36.0)
MCHC: 33.9 g/dL (ref 30.0–36.0)
MCV: 104 fL — ABNORMAL HIGH (ref 78.0–100.0)
MCV: 104.5 fL — ABNORMAL HIGH (ref 78.0–100.0)
PLATELETS: 275 10*3/uL (ref 150–400)
Platelets: 299 10*3/uL (ref 150–400)
RBC: 3.01 MIL/uL — ABNORMAL LOW (ref 3.87–5.11)
RBC: 2.65 MIL/uL — ABNORMAL LOW (ref 3.87–5.11)
RDW: 13.3 % (ref 11.5–15.5)
RDW: 13.3 % (ref 11.5–15.5)
WBC: 20 10*3/uL — ABNORMAL HIGH (ref 4.0–10.5)
WBC: 17.3 10*3/uL — ABNORMAL HIGH (ref 4.0–10.5)

## 2016-09-25 LAB — COMPREHENSIVE METABOLIC PANEL
ALT: 36 U/L (ref 14–54)
ANION GAP: 7 (ref 5–15)
AST: 29 U/L (ref 15–41)
Albumin: 2.8 g/dL — ABNORMAL LOW (ref 3.5–5.0)
Alkaline Phosphatase: 103 U/L (ref 38–126)
BUN: 14 mg/dL (ref 6–20)
CO2: 21 mmol/L — ABNORMAL LOW (ref 22–32)
Calcium: 8.6 mg/dL — ABNORMAL LOW (ref 8.9–10.3)
Chloride: 107 mmol/L (ref 101–111)
Creatinine, Ser: 0.67 mg/dL (ref 0.44–1.00)
Glucose, Bld: 96 mg/dL (ref 65–99)
Potassium: 4 mmol/L (ref 3.5–5.1)
SODIUM: 135 mmol/L (ref 135–145)
TOTAL PROTEIN: 6.5 g/dL (ref 6.5–8.1)
Total Bilirubin: 0.3 mg/dL (ref 0.3–1.2)

## 2016-09-25 SURGERY — Surgical Case
Anesthesia: Spinal

## 2016-09-25 MED ORDER — MORPHINE SULFATE (PF) 0.5 MG/ML IJ SOLN
INTRAMUSCULAR | Status: AC
  Filled 2016-09-25: qty 10

## 2016-09-25 MED ORDER — SOD CITRATE-CITRIC ACID 500-334 MG/5ML PO SOLN
30.0000 mL | Freq: Once | ORAL | Status: AC
  Administered 2016-09-25: 30 mL via ORAL

## 2016-09-25 MED ORDER — SENNOSIDES-DOCUSATE SODIUM 8.6-50 MG PO TABS
2.0000 | ORAL_TABLET | ORAL | Status: DC
  Administered 2016-09-25: 2 via ORAL
  Filled 2016-09-25 (×3): qty 2

## 2016-09-25 MED ORDER — PRENATAL MULTIVITAMIN CH
1.0000 | ORAL_TABLET | Freq: Every day | ORAL | Status: DC
  Administered 2016-09-25 – 2016-09-28 (×4): 1 via ORAL
  Filled 2016-09-25 (×6): qty 1

## 2016-09-25 MED ORDER — TETANUS-DIPHTH-ACELL PERTUSSIS 5-2.5-18.5 LF-MCG/0.5 IM SUSP
0.5000 mL | Freq: Once | INTRAMUSCULAR | Status: DC
  Filled 2016-09-25: qty 0.5

## 2016-09-25 MED ORDER — LABETALOL HCL 5 MG/ML IV SOLN
INTRAVENOUS | Status: AC
  Filled 2016-09-25: qty 4

## 2016-09-25 MED ORDER — CEFAZOLIN SODIUM-DEXTROSE 2-4 GM/100ML-% IV SOLN
INTRAVENOUS | Status: AC
  Filled 2016-09-25: qty 100

## 2016-09-25 MED ORDER — ACETAMINOPHEN 500 MG PO TABS
1000.0000 mg | ORAL_TABLET | Freq: Four times a day (QID) | ORAL | Status: AC
  Administered 2016-09-25 (×4): 1000 mg via ORAL
  Filled 2016-09-25 (×4): qty 2

## 2016-09-25 MED ORDER — SODIUM CHLORIDE 0.9% FLUSH: 3.0000 mL | INTRAVENOUS | Status: DC | PRN

## 2016-09-25 MED ORDER — NALOXONE HCL 0.4 MG/ML IJ SOLN: 0.4000 mg | INTRAMUSCULAR | Status: DC | PRN

## 2016-09-25 MED ORDER — COCONUT OIL OIL
1.0000 "application " | TOPICAL_OIL | Status: DC | PRN
  Administered 2016-09-28: 1 via TOPICAL
  Filled 2016-09-25 (×2): qty 120

## 2016-09-25 MED ORDER — HYDRALAZINE HCL 20 MG/ML IJ SOLN: 10.0000 mg | Freq: Once | INTRAMUSCULAR | Status: DC | PRN

## 2016-09-25 MED ORDER — ONDANSETRON HCL 4 MG/2ML IJ SOLN
INTRAMUSCULAR | Status: DC | PRN
  Administered 2016-09-25: 4 mg via INTRAVENOUS

## 2016-09-25 MED ORDER — BUPIVACAINE HCL (PF) 0.5 % IJ SOLN
INTRAMUSCULAR | Status: AC
  Filled 2016-09-25: qty 30

## 2016-09-25 MED ORDER — OXYTOCIN 10 UNIT/ML IJ SOLN
INTRAMUSCULAR | Status: AC
  Filled 2016-09-25: qty 4

## 2016-09-25 MED ORDER — SODIUM CHLORIDE 0.9 % IR SOLN
Status: DC | PRN
  Administered 2016-09-25: 1000 mL

## 2016-09-25 MED ORDER — LACTATED RINGERS IV BOLUS (SEPSIS)
500.0000 mL | Freq: Once | INTRAVENOUS | Status: AC
  Administered 2016-09-25: 500 mL via INTRAVENOUS

## 2016-09-25 MED ORDER — OXYTOCIN 10 UNIT/ML IJ SOLN
INTRAVENOUS | Status: DC | PRN
  Administered 2016-09-25: 40 [IU] via INTRAVENOUS

## 2016-09-25 MED ORDER — ONDANSETRON HCL 4 MG/2ML IJ SOLN
INTRAMUSCULAR | Status: AC
  Filled 2016-09-25: qty 2

## 2016-09-25 MED ORDER — FENTANYL CITRATE (PF) 100 MCG/2ML IJ SOLN
INTRAMUSCULAR | Status: AC
  Filled 2016-09-25: qty 2

## 2016-09-25 MED ORDER — ONDANSETRON HCL 4 MG/2ML IJ SOLN: 4.0000 mg | Freq: Three times a day (TID) | INTRAMUSCULAR | Status: DC | PRN

## 2016-09-25 MED ORDER — KETOROLAC TROMETHAMINE 30 MG/ML IJ SOLN
INTRAMUSCULAR | Status: AC
  Administered 2016-09-25: 30 mg via INTRAMUSCULAR
  Filled 2016-09-25: qty 1

## 2016-09-25 MED ORDER — NALOXONE HCL 2 MG/2ML IJ SOSY
1.0000 ug/kg/h | PREFILLED_SYRINGE | INTRAVENOUS | Status: DC | PRN
  Filled 2016-09-25: qty 2

## 2016-09-25 MED ORDER — DIPHENHYDRAMINE HCL 50 MG/ML IJ SOLN: 12.5000 mg | INTRAMUSCULAR | Status: DC | PRN

## 2016-09-25 MED ORDER — SCOPOLAMINE 1 MG/3DAYS TD PT72
MEDICATED_PATCH | TRANSDERMAL | Status: DC | PRN
  Administered 2016-09-25: 1 via TRANSDERMAL

## 2016-09-25 MED ORDER — DIBUCAINE 1 % RE OINT
1.0000 "application " | TOPICAL_OINTMENT | RECTAL | Status: DC | PRN
  Filled 2016-09-25: qty 28

## 2016-09-25 MED ORDER — SIMETHICONE 80 MG PO CHEW
80.0000 mg | CHEWABLE_TABLET | ORAL | Status: DC | PRN
  Filled 2016-09-25: qty 1

## 2016-09-25 MED ORDER — KETOROLAC TROMETHAMINE 30 MG/ML IJ SOLN: 30.0000 mg | Freq: Four times a day (QID) | INTRAMUSCULAR | Status: DC | PRN

## 2016-09-25 MED ORDER — DIPHENHYDRAMINE HCL 25 MG PO CAPS
25.0000 mg | ORAL_CAPSULE | Freq: Four times a day (QID) | ORAL | Status: DC | PRN
  Filled 2016-09-25: qty 1

## 2016-09-25 MED ORDER — MEPERIDINE HCL 25 MG/ML IJ SOLN: 6.2500 mg | INTRAMUSCULAR | Status: DC | PRN

## 2016-09-25 MED ORDER — LABETALOL HCL 5 MG/ML IV SOLN
20.0000 mg | INTRAVENOUS | Status: DC | PRN
  Administered 2016-09-25: 20 mg via INTRAVENOUS

## 2016-09-25 MED ORDER — MORPHINE SULFATE-NACL 0.5-0.9 MG/ML-% IV SOSY
PREFILLED_SYRINGE | INTRAVENOUS | Status: DC | PRN
  Administered 2016-09-25: .2 mg via INTRATHECAL

## 2016-09-25 MED ORDER — WITCH HAZEL-GLYCERIN EX PADS: 1.0000 "application " | MEDICATED_PAD | CUTANEOUS | Status: DC | PRN

## 2016-09-25 MED ORDER — IBUPROFEN 600 MG PO TABS
600.0000 mg | ORAL_TABLET | Freq: Four times a day (QID) | ORAL | Status: DC | PRN
  Administered 2016-09-25: 600 mg via ORAL

## 2016-09-25 MED ORDER — KETOROLAC TROMETHAMINE 30 MG/ML IJ SOLN: 30.0000 mg | Freq: Once | INTRAMUSCULAR | Status: DC | PRN

## 2016-09-25 MED ORDER — LACTATED RINGERS IV SOLN
INTRAVENOUS | Status: DC | PRN
  Administered 2016-09-25 (×2): via INTRAVENOUS

## 2016-09-25 MED ORDER — HYDROMORPHONE HCL 1 MG/ML IJ SOLN: 0.2500 mg | INTRAMUSCULAR | Status: DC | PRN

## 2016-09-25 MED ORDER — BUPIVACAINE HCL (PF) 0.5 % IJ SOLN
INTRAMUSCULAR | Status: DC | PRN
  Administered 2016-09-25: 30 mL

## 2016-09-25 MED ORDER — NALBUPHINE HCL 10 MG/ML IJ SOLN
5.0000 mg | INTRAMUSCULAR | Status: DC | PRN
  Administered 2016-09-26: 5 mg via SUBCUTANEOUS
  Filled 2016-09-25: qty 1

## 2016-09-25 MED ORDER — CEFAZOLIN SODIUM-DEXTROSE 2-3 GM-% IV SOLR
INTRAVENOUS | Status: DC | PRN
  Administered 2016-09-25: 2 g via INTRAVENOUS

## 2016-09-25 MED ORDER — MAGNESIUM SULFATE 40 G IN LACTATED RINGERS - SIMPLE
2.0000 g/h | INTRAVENOUS | Status: AC
  Administered 2016-09-25: 2 g/h via INTRAVENOUS
  Filled 2016-09-25: qty 40
  Filled 2016-09-25: qty 500

## 2016-09-25 MED ORDER — OXYTOCIN 40 UNITS IN LACTATED RINGERS INFUSION - SIMPLE MED: 2.5000 [IU]/h | INTRAVENOUS | Status: AC

## 2016-09-25 MED ORDER — SIMETHICONE 80 MG PO CHEW
80.0000 mg | CHEWABLE_TABLET | ORAL | Status: DC
  Administered 2016-09-25: 80 mg via ORAL
  Filled 2016-09-25 (×3): qty 1

## 2016-09-25 MED ORDER — NALBUPHINE HCL 10 MG/ML IJ SOLN: 5.0000 mg | Freq: Once | INTRAMUSCULAR | Status: DC | PRN

## 2016-09-25 MED ORDER — SCOPOLAMINE 1 MG/3DAYS TD PT72
MEDICATED_PATCH | TRANSDERMAL | Status: AC
  Filled 2016-09-25: qty 1

## 2016-09-25 MED ORDER — LACTATED RINGERS IV SOLN
INTRAVENOUS | Status: DC
  Administered 2016-09-25 (×3): via INTRAVENOUS

## 2016-09-25 MED ORDER — BUPIVACAINE IN DEXTROSE 0.75-8.25 % IT SOLN
INTRATHECAL | Status: AC
  Filled 2016-09-25: qty 2

## 2016-09-25 MED ORDER — FENTANYL CITRATE (PF) 100 MCG/2ML IJ SOLN
INTRAMUSCULAR | Status: DC | PRN
  Administered 2016-09-25: 20 ug via INTRATHECAL

## 2016-09-25 MED ORDER — KETOROLAC TROMETHAMINE 30 MG/ML IJ SOLN
30.0000 mg | Freq: Four times a day (QID) | INTRAMUSCULAR | Status: DC | PRN
  Administered 2016-09-25: 30 mg via INTRAMUSCULAR

## 2016-09-25 MED ORDER — BUPIVACAINE IN DEXTROSE 0.75-8.25 % IT SOLN
INTRATHECAL | Status: DC | PRN
  Administered 2016-09-25: 10.5 mg via INTRATHECAL

## 2016-09-25 MED ORDER — PHENYLEPHRINE 8 MG IN D5W 100 ML (0.08MG/ML) PREMIX OPTIME
INJECTION | INTRAVENOUS | Status: AC
  Filled 2016-09-25: qty 100

## 2016-09-25 MED ORDER — SCOPOLAMINE 1 MG/3DAYS TD PT72: 1.0000 | MEDICATED_PATCH | Freq: Once | TRANSDERMAL | Status: DC

## 2016-09-25 MED ORDER — NALBUPHINE HCL 10 MG/ML IJ SOLN: 5.0000 mg | INTRAMUSCULAR | Status: DC | PRN

## 2016-09-25 MED ORDER — ZOLPIDEM TARTRATE 5 MG PO TABS: 5.0000 mg | ORAL_TABLET | Freq: Every evening | ORAL | Status: DC | PRN

## 2016-09-25 MED ORDER — DIPHENHYDRAMINE HCL 25 MG PO CAPS
25.0000 mg | ORAL_CAPSULE | ORAL | Status: DC | PRN
  Filled 2016-09-25: qty 1

## 2016-09-25 MED ORDER — SIMETHICONE 80 MG PO CHEW
80.0000 mg | CHEWABLE_TABLET | Freq: Three times a day (TID) | ORAL | Status: DC
  Administered 2016-09-25 – 2016-09-28 (×8): 80 mg via ORAL
  Filled 2016-09-25 (×17): qty 1

## 2016-09-25 MED ORDER — IBUPROFEN 600 MG PO TABS
600.0000 mg | ORAL_TABLET | Freq: Four times a day (QID) | ORAL | Status: DC
  Administered 2016-09-25 – 2016-09-28 (×12): 600 mg via ORAL
  Filled 2016-09-25 (×14): qty 1

## 2016-09-25 MED ORDER — MENTHOL 3 MG MT LOZG
1.0000 | LOZENGE | OROMUCOSAL | Status: DC | PRN
  Filled 2016-09-25: qty 9

## 2016-09-25 MED ORDER — MAGNESIUM SULFATE BOLUS VIA INFUSION
4.0000 g | Freq: Once | INTRAVENOUS | Status: AC
Start: 2016-09-25 — End: 2016-09-25
  Administered 2016-09-25: 4 g via INTRAVENOUS
  Filled 2016-09-25: qty 500

## 2016-09-25 MED ORDER — ACETAMINOPHEN 325 MG PO TABS: 650.0000 mg | ORAL_TABLET | ORAL | Status: DC | PRN

## 2016-09-25 MED ORDER — PROMETHAZINE HCL 25 MG/ML IJ SOLN: 6.2500 mg | INTRAMUSCULAR | Status: DC | PRN

## 2016-09-25 SURGICAL SUPPLY — 34 items
BARRIER ADHS 3X4 INTERCEED (GAUZE/BANDAGES/DRESSINGS) IMPLANT
BENZOIN TINCTURE PRP APPL 2/3 (GAUZE/BANDAGES/DRESSINGS) ×3 IMPLANT
CHLORAPREP W/TINT 26ML (MISCELLANEOUS) ×3 IMPLANT
CLAMP CORD UMBIL (MISCELLANEOUS) IMPLANT
CLOSURE WOUND 1/2 X4 (GAUZE/BANDAGES/DRESSINGS) ×1
CLOTH BEACON ORANGE TIMEOUT ST (SAFETY) ×3 IMPLANT
DRSG OPSITE POSTOP 4X10 (GAUZE/BANDAGES/DRESSINGS) ×3 IMPLANT
ELECT REM PT RETURN 9FT ADLT (ELECTROSURGICAL) ×3
ELECTRODE REM PT RTRN 9FT ADLT (ELECTROSURGICAL) ×1 IMPLANT
EXTRACTOR VACUUM KIWI (MISCELLANEOUS) IMPLANT
GLOVE BIO SURGEON STRL SZ 6.5 (GLOVE) ×2 IMPLANT
GLOVE BIO SURGEONS STRL SZ 6.5 (GLOVE) ×1
GLOVE BIOGEL PI IND STRL 7.0 (GLOVE) ×2 IMPLANT
GLOVE BIOGEL PI INDICATOR 7.0 (GLOVE) ×4
GOWN STRL REUS W/TWL LRG LVL3 (GOWN DISPOSABLE) ×6 IMPLANT
KIT ABG SYR 3ML LUER SLIP (SYRINGE) ×3 IMPLANT
NEEDLE HYPO 22GX1.5 SAFETY (NEEDLE) ×3 IMPLANT
NEEDLE HYPO 25X5/8 SAFETYGLIDE (NEEDLE) ×3 IMPLANT
NS IRRIG 1000ML POUR BTL (IV SOLUTION) ×3 IMPLANT
PACK C SECTION WH (CUSTOM PROCEDURE TRAY) ×3 IMPLANT
PAD ABD 7.5X8 STRL (GAUZE/BANDAGES/DRESSINGS) ×6 IMPLANT
PAD OB MATERNITY 4.3X12.25 (PERSONAL CARE ITEMS) ×3 IMPLANT
PENCIL SMOKE EVAC W/HOLSTER (ELECTROSURGICAL) ×3 IMPLANT
RETRACTOR WND ALEXIS 25 LRG (MISCELLANEOUS) IMPLANT
RTRCTR WOUND ALEXIS 25CM LRG (MISCELLANEOUS)
SPONGE GAUZE 4X4 12PLY STER LF (GAUZE/BANDAGES/DRESSINGS) ×6 IMPLANT
STRIP CLOSURE SKIN 1/2X4 (GAUZE/BANDAGES/DRESSINGS) ×2 IMPLANT
SUT VIC AB 0 CT1 36 (SUTURE) ×18 IMPLANT
SUT VIC AB 2-0 CT1 27 (SUTURE) ×2
SUT VIC AB 2-0 CT1 TAPERPNT 27 (SUTURE) ×1 IMPLANT
SUT VIC AB 4-0 PS2 27 (SUTURE) ×3 IMPLANT
SYR CONTROL 10ML LL (SYRINGE) ×3 IMPLANT
TOWEL OR 17X24 6PK STRL BLUE (TOWEL DISPOSABLE) ×3 IMPLANT
TRAY FOLEY BAG SILVER LF 14FR (SET/KITS/TRAYS/PACK) IMPLANT

## 2016-09-25 NOTE — Progress Notes (Signed)
Dr. Debroah Loop at bedside aware of FHR, BP talking with pt on plan of care to do C/S.

## 2016-09-25 NOTE — Lactation Note (Signed)
This note was copied from a baby's chart. Lactation Consultation Note  Patient Name: Jennifer Peters Date: 09/25/2016 Reason for consult: Initial assessment;NICU baby Breastfeeding consultation services and support information given to patient.  Providing Breastmilk For Your Baby In NICU booklet also given for review.  Mom states she tried to nurse her last baby but did not make any milk.  Discussed milk coming to volume and the importance of pumping/hand expression 8-12 times/24 hours.  Taught mom hand expression but no milk seen. Symphony pump set up and initiated. Mom has WIC so referral faxed to [facility] office for a pump after discharge.  Encouraged to call with concerns/assist prn.  Maternal Data Has patient been taught Hand Expression?: Yes Does the patient have breastfeeding experience prior to this delivery?: No  Feeding    LATCH Score/Interventions                      Lactation Tools Discussed/Used WIC Program: Yes Pump Review: Setup, frequency, and cleaning;Milk Storage Initiated by:: LC Date initiated:: 09/25/16   Consult Status Consult Status: Follow-up Date: 09/26/16 Follow-up type: In-patient    Huston Foley 09/25/2016, 12:15 PM

## 2016-09-25 NOTE — Anesthesia Procedure Notes (Signed)
Spinal  Patient location during procedure: OR Start time: 09/25/2016 3:42 AM End time: 09/25/2016 3:46 AM Staffing Anesthesiologist: Leilani Able Performed: anesthesiologist  Preanesthetic Checklist Completed: patient identified, surgical consent, pre-op evaluation, timeout performed, IV checked, risks and benefits discussed and monitors and equipment checked Spinal Block Patient position: sitting Prep: site prepped and draped and DuraPrep Patient monitoring: heart rate, cardiac monitor, continuous pulse ox and blood pressure Approach: midline Location: L3-4 Injection technique: single-shot Needle Needle type: Pencan  Needle gauge: 24 G Needle length: 9 cm Needle insertion depth: 6 cm Assessment Sensory level: T4 Events: paresthesia Additional Notes R leg X 2. Not dosed until paresthesia dissapated

## 2016-09-25 NOTE — Op Note (Signed)
Cesarean Section Procedure Note   Jennifer Peters  09/11/2016 - 09/25/2016  Indications: non reassuring fetal surveillance   Pre-operative Diagnosis: primary cesarean section.   Post-operative Diagnosis: Same   Surgeon: Surgeon(s) and Role:    * Adam Phenix, MD - Primary   Assistants: none  Anesthesia: spinal   Procedure Details:  The patient was seen in the Holding Room. The risks, benefits, complications, treatment options, and expected outcomes were discussed with the patient. The patient concurred with the proposed plan, giving informed consent. identified as Jennifer Peters and the procedure verified as C-Section Delivery. A Time Out was held and the above information confirmed.  After induction of anesthesia, the patient was draped and prepped in the usual sterile manner. A transverse was made and carried down through the subcutaneous tissue to the fascia. Fascial incision was made and extended transversely. The fascia was separated from the underlying rectus tissue superiorly and inferiorly. The peritoneum was identified and entered. Peritoneal incision was extended longitudinally. The utero-vesical peritoneal reflection was incised transversely and the bladder flap was bluntly freed from the lower uterine segment. A low transverse uterine incision was made. Delivered from cephalic presentation was a 1040 gram Living newborn infant(s) or Female with Apgar scores of 4 at one minute and 9 at five minutes. Cord ph was sent the umbilical cord was clamped and cut cord blood was obtained for evaluation. The placenta was removed Intact and appeared normal. The uterine outline, tubes and ovaries appeared normal. The peritoneum was closed with 2-0 Vicryl.. The uterine incision was closed with running locked sutures of 0 Vicryl and a second layer of suture followed  Hemostasis was observed.The fascia was then reapproximated with running sutures of 0Vicryl. The subcuticular closure was performed using  4-0Vicryl. T  Instrument, sponge, and needle counts were correct prior the abdominal closure and were correct at the conclusion of the case.    Findings:   Estimated Blood Loss: 600 ml   Total IV Fluids:   Urine Output: 100CC OF clear urine  Specimens: Placenta to pathology  Complications: no complications  Disposition: PACU - hemodynamically stable.   Maternal Condition: stable   Baby condition / location:  NICU  Attending Attestation: I was present and scrubbed for the entire procedure.   Signed: Surgeon(s): Adam Phenix, MD  09/25/2016 4:53 AM

## 2016-09-25 NOTE — Progress Notes (Signed)
Pt called FOB and sister.   FOB didn't answer. And sister was unable to come to hospital

## 2016-09-25 NOTE — Progress Notes (Signed)
Jennifer Peters called notified of decel, told to call dr. Debroah Loop. Dr. Debroah Loop called just came out of or will come see pt notified of FHR decel will conitnue to monitor

## 2016-09-25 NOTE — Transfer of Care (Signed)
Immediate Anesthesia Transfer of Care Note  Patient: Jennifer Peters  Procedure(s) Performed: Procedure(s): CESAREAN SECTION (N/A)  Patient Location: PACU  Anesthesia Type:Spinal  Level of Consciousness: awake  Airway & Oxygen Therapy: Patient Spontanous Breathing  Post-op Assessment: Report given to RN and Post -op Vital signs reviewed and stable  Post vital signs: stable  Last Vitals:  Vitals:   09/24/16 1951 09/25/16 0020  BP: (!) 151/94 120/81  Pulse: 69 91  Resp: 18   Temp: 37 C 37 C    Last Pain:  Vitals:   09/25/16 0020  TempSrc: Oral  PainSc:       Patients Stated Pain Goal: 0 (09/22/16 1541)  Complications: No apparent anesthesia complications

## 2016-09-25 NOTE — Anesthesia Preprocedure Evaluation (Signed)
Anesthesia Evaluation  Patient identified by MRN, date of birth, ID band Patient awake    Reviewed: Allergy & Precautions, H&P , NPO status , Patient's Chart, lab work & pertinent test results  Airway Mallampati: II  TM Distance: >3 FB Neck ROM: full    Dental no notable dental hx.    Pulmonary former smoker,    Pulmonary exam normal        Cardiovascular hypertension, Pt. on home beta blockers negative cardio ROS Normal cardiovascular exam     Neuro/Psych negative neurological ROS     GI/Hepatic negative GI ROS, Neg liver ROS,   Endo/Other  negative endocrine ROS  Renal/GU negative Renal ROS  negative genitourinary   Musculoskeletal negative musculoskeletal ROS (+)   Abdominal Normal abdominal exam  (+)   Peds  Hematology   Anesthesia Other Findings   Reproductive/Obstetrics (+) Pregnancy                             Anesthesia Physical Anesthesia Plan  ASA: II  Anesthesia Plan: Spinal   Post-op Pain Management:    Induction:   Airway Management Planned:   Additional Equipment:   Intra-op Plan:   Post-operative Plan:   Informed Consent: I have reviewed the patients History and Physical, chart, labs and discussed the procedure including the risks, benefits and alternatives for the proposed anesthesia with the patient or authorized representative who has indicated his/her understanding and acceptance.     Plan Discussed with: CRNA and Surgeon  Anesthesia Plan Comments:         Anesthesia Quick Evaluation

## 2016-09-25 NOTE — Consult Note (Signed)
Neonatology Note:   Attendance at C-section:    I was asked by Dr. Debroah Loop to attend this primary C/S at 30 5/7 weeks due to pre-eclampsia, NRFHR, and intermittent AEDF. The mother is a G4P2A1 A pos, GBS neg with pre-eclampsia. She has been hospitalized since 4/10 and   treated with Labetalol and aspirin; also got Betamethasone on 4/5, 4/7, and 4/22-23. Fetal monitoring tonight showed late decelerations and minimal variability. ROM at delivery, fluid clear. Infant vigorous with good cry and tone, tolerated delayed cord clamping for 1 minute. We bulb suctioned and placed him into the portawarmer bag. His HR was about 90 and respirations were very shallow, so applied PPV, with increase in HR and improvement in color. Gave PPV for about 2 minutes, then placed on neopuff. Respirations initially irregular, but with intermittent PPV, he stabilized and was on just CPAP support by 5-6 minutes and we were able to wean FIO2 to 21% prior to transport. Ap 4/9. Lungs with good air exchange in DR. Shown to his mother, then transported to NICU on CPAP.   Doretha Sou, MD

## 2016-09-25 NOTE — Progress Notes (Signed)
Jennifer Peters is a 26 y.o. [phone] at [redacted]w[redacted]d by LMP admitted for preeclampsia and abnormal cord dopplers  Subjective: No contractions  Objective: BP 120/81   Pulse 91   Temp 98.6 F (37 C) (Oral)   Resp 18   Ht  (1.575 m)   Wt 69.4 kg (153 lb)   LMP 02/29/2016 (Approximate)   SpO2 97%   BMI 27.98 kg/m  No intake/output data recorded. No intake/output data recorded.  FHT:   Fetal Heart Rate A  Mode External filed at 09/25/2016 0000  Baseline Rate (A) 145 bpm filed at 09/25/2016 0000  Variability 6-25 BPM filed at 09/25/2016 0000  Accelerations 10 x 10 filed at 09/25/2016 0000  Decelerations Variable filed at 09/25/2016 0000   Now recurrent spontaneous deep deceleration UC:   none SVE:      Labs: Lab Results  Component Value Date   WBC 11.2 (H) 09/22/2016   HGB 10.4 (L) 09/22/2016   HCT 30.0 (L) 09/22/2016   MCV 102.7 (H) 09/22/2016   PLT 229 09/22/2016    Assessment / Plan: preeclampsia and non reassuring fetal heart rate   Preeclampsia:  elevated BP Fetal Wellbeing:  Category III  I/D:  n/a Anticipated MOD:   Offered cesarean delivery. The risks of cesarean section discussed with the patient included but were not limited to: bleeding which may require transfusion or reoperation; infection which may require antibiotics; injury to bowel, bladder, ureters or other surrounding organs; injury to the fetus; need for additional procedures including hysterectomy in the event of a life-threatening hemorrhage; placental abnormalities wth subsequent pregnancies, incisional problems, thromboembolic phenomenon and other postoperative/anesthesia complications. The patient concurred with the proposed plan, giving informed written consent for the procedure.   Patient has been NPO since 1900 she will remain NPO for procedure. Anesthesia and OR aware. Preoperative prophylactic antibiotics and SCDs ordered on call to the OR.  To OR when ready.    Scheryl Darter 09/25/2016, 2:56  AM

## 2016-09-25 NOTE — Progress Notes (Signed)
0255 Pt prepped for OR, shave and CHG wash done. Nasal swab done SCD applied. teaching done

## 2016-09-25 NOTE — Progress Notes (Signed)
Melanie CNM returned call orders rec. Pt updated on plan of care

## 2016-09-25 NOTE — Anesthesia Postprocedure Evaluation (Addendum)
Anesthesia Post Note  Patient: Jennifer Peters  Procedure(s) Performed: Procedure(s) (LRB): CESAREAN SECTION (N/A)  Patient location during evaluation: [facility] Anesthesia Type: Spinal Level of consciousness: awake and alert and oriented Pain management: pain level controlled Vital Signs Assessment: post-procedure vital signs reviewed and stable Respiratory status: spontaneous breathing and nonlabored ventilation Cardiovascular status: stable Postop Assessment: no headache, patient able to bend at knees, no backache, no signs of nausea or vomiting, adequate PO intake and spinal receding Anesthetic complications: no        Last Vitals:  Vitals:   09/25/16 1300 09/25/16 1415  BP: 132/88   Pulse: 65   Resp: 18 16  Temp: 36.4 C     Last Pain:  Vitals:   09/25/16 1300  TempSrc: Oral  PainSc:    Pain Goal: Patients Stated Pain Goal: 0 (09/25/16 0958)               Laban Emperor

## 2016-09-25 NOTE — Progress Notes (Signed)
Dr. Debroah Loop called to review strip in OR doing c/s.  Melanie CNM called updated on pt status to review strip will call be back.

## 2016-09-26 ENCOUNTER — Encounter (HOSPITAL_COMMUNITY): Payer: Self-pay | Admitting: Obstetrics & Gynecology

## 2016-09-26 LAB — COMPREHENSIVE METABOLIC PANEL
ALBUMIN: 2.7 g/dL — AB (ref 3.5–5.0)
ALK PHOS: 108 U/L (ref 38–126)
ALT: 30 U/L (ref 14–54)
ANION GAP: 7 (ref 5–15)
AST: 29 U/L (ref 15–41)
BILIRUBIN TOTAL: 0.3 mg/dL (ref 0.3–1.2)
BUN: 10 mg/dL (ref 6–20)
CALCIUM: 6.7 mg/dL — AB (ref 8.9–10.3)
CO2: 28 mmol/L (ref 22–32)
Chloride: 104 mmol/L (ref 101–111)
Creatinine, Ser: 0.77 mg/dL (ref 0.44–1.00)
GFR calc Af Amer: >60 mL/min (ref >60)
GLUCOSE: 100 mg/dL — AB (ref 65–99)
Potassium: 3.6 mmol/L (ref 3.5–5.1)
Sodium: 139 mmol/L (ref 135–145)
TOTAL PROTEIN: 5.9 g/dL — AB (ref 6.5–8.1)

## 2016-09-26 MED ORDER — SENNOSIDES-DOCUSATE SODIUM 8.6-50 MG PO TABS
2.0000 | ORAL_TABLET | Freq: Every evening | ORAL | Status: DC | PRN
  Administered 2016-09-28: 2 via ORAL
  Filled 2016-09-26: qty 2

## 2016-09-26 MED ORDER — OXYCODONE HCL 5 MG PO TABS
10.0000 mg | ORAL_TABLET | Freq: Once | ORAL | Status: AC
  Administered 2016-09-26: 10 mg via ORAL
  Filled 2016-09-26: qty 2

## 2016-09-26 MED ORDER — AMLODIPINE BESYLATE 5 MG PO TABS
5.0000 mg | ORAL_TABLET | Freq: Every day | ORAL | Status: DC
  Administered 2016-09-26 – 2016-09-28 (×3): 5 mg via ORAL
  Filled 2016-09-26 (×3): qty 1

## 2016-09-26 MED ORDER — OXYCODONE-ACETAMINOPHEN 5-325 MG PO TABS
1.0000 | ORAL_TABLET | Freq: Four times a day (QID) | ORAL | Status: DC | PRN
  Administered 2016-09-26 – 2016-09-27 (×4): 2 via ORAL
  Filled 2016-09-26 (×4): qty 2
  Filled 2016-09-26: qty 1

## 2016-09-26 NOTE — Progress Notes (Signed)
Daily Postpartum Note  Admission Date: 09/11/2016 Current Date: 09/26/2016 9:11 AM  Jennifer Peters is a 26 y.o. [id number] POD#1 s/p pLTCS for NRFHTs Pregnancy complicated by: Patient Active Problem List   Diagnosis Date Noted  . Transaminitis 09/18/2016  . 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  . Abnormal umbilical cord dopplers 09/18/2016  . Severe preeclampsia, third trimester 09/11/2016  . Supervision of high risk pregnancy in third trimester 05/02/2016    Overnight/24hr events:  Patient has voided since foley came out. Mg came off around 0500 today.   Subjective:  No s/s of pre-eclampsia; meeting all PO/PP goals except no flatus yet.   Objective:    Current Vital Signs 24h Vital Sign Ranges  T 98.6 F (37 C) Temp  Avg: 98 F (36.7 C)  Min: 97.6 F (36.4 C)  Max: 98.6 F (37 C)  BP (!) 139/92 BP  Min: 126/82  Max: 148/102  HR 80 Pulse  Avg: 70.3  Min: 65  Max: 80  RR 18 Resp  Avg: 17  Min: 16  Max: 18  SaO2 100 % Not Delivered SpO2  Avg: 99.4 %  Min: 97 %  Max: 100 %       24 Hour I/O Current Shift I/O  Time Ins Outs 04/24 0701 - 04/25 0700 In: 5595 [P.O.:4040; I.V.:1555] Out: 4200 [Urine:4200] No intake/output data recorded.   Patient Vitals for the past 24 hrs:  BP Temp Temp src Pulse Resp SpO2  09/26/16 0800 (!) 139/92 98.6 F (37 C) Oral 80 18 100 %  09/26/16 0602 (!) 147/100 98.6 F (37 C) Oral 75 16 100 %  09/26/16 0204 (!) 140/100 98.1 F (36.7 C) Oral 68 18 100 %  09/25/16 2205 (!) 140/97 98 F (36.7 C) Oral 66 18 100 %  09/25/16 1901 (!) 148/102 - - - - -  09/25/16 1803 - - - - 18 -  09/25/16 1715 - - - - 16 -  09/25/16 1644 - - - - 16 -  09/25/16 1614 (!) 135/101 97.9 F (36.6 C) Oral 66 16 100 %  09/25/16 1528 - - - - 16 -  09/25/16 1415 - - - - 16 -  09/25/16 1300 132/88 97.6 F (36.4 C) Oral 65 18 97 %  09/25/16 1158 126/82 97.7 F (36.5 C) Oral 72 18 100 %  09/25/16 1146 - - - - 16 -  09/25/16 1057 (!) 147/97 97.8 F (36.6 C) Oral 75 18 99 %   09/25/16 1049 - - - - 16 -  09/25/16 0937 126/77 97.8 F (36.6 C) Axillary 66 18 99 %    Physical exam: General: Well nourished, well developed female in no acute distress. Abdomen: gravid, rare BS, NTTP, mildly distended. c/d/I pressure dressing Cardiovascular: S1, S2 normal, no murmur, rub or gallop, regular rate and rhythm Respiratory: CTAB Extremities: no clubbing, cyanosis or edema Skin: Warm and dry.   Medications: Current Facility-Administered Medications  Medication Dose Route Frequency Provider Last Rate Last Dose  . acetaminophen (TYLENOL) tablet 650 mg  650 mg Oral Q4H PRN Adam Phenix, MD      . coconut oil  1 application Topical PRN Adam Phenix, MD      . witch hazel-glycerin (TUCKS) pad 1 application  1 application Topical PRN Adam Phenix, MD       And  . dibucaine (NUPERCAINAL) 1 % rectal ointment 1 application  1 application Rectal PRN Adam Phenix, MD      . diphenhydrAMINE (  BENADRYL) injection 12.5 mg  12.5 mg Intravenous Q4H PRN Leilani Able, MD       Or  . diphenhydrAMINE (BENADRYL) capsule 25 mg  25 mg Oral Q4H PRN Leilani Able, MD      . diphenhydrAMINE (BENADRYL) capsule 25 mg  25 mg Oral Q6H PRN Adam Phenix, MD      . hydrALAZINE (APRESOLINE) injection 10 mg  10 mg Intravenous Once PRN Adam Phenix, MD      . ibuprofen (ADVIL,MOTRIN) tablet 600 mg  600 mg Oral Q6H PRN Leilani Able, MD   600 mg at 09/25/16 1146  . ibuprofen (ADVIL,MOTRIN) tablet 600 mg  600 mg Oral Q6H Adam Phenix, MD   600 mg at 09/26/16 0514  . labetalol (NORMODYNE,TRANDATE) injection 20-80 mg  20-80 mg Intravenous Q10 min PRN Adam Phenix, MD   20 mg at 09/25/16 0608  . lactated ringers infusion   Intravenous Continuous Adam Phenix, MD   Stopped at 09/26/16 [phone]  . menthol-cetylpyridinium (CEPACOL) lozenge 3 mg  1 lozenge Oral Q2H PRN Adam Phenix, MD      . nalbuphine (NUBAIN) injection 5 mg  5 mg Intravenous Q4H PRN Leilani Able, MD       Or   . nalbuphine (NUBAIN) injection 5 mg  5 mg Subcutaneous Q4H PRN Leilani Able, MD   5 mg at 09/26/16 0225  . nalbuphine (NUBAIN) injection 5 mg  5 mg Intravenous Once PRN Leilani Able, MD       Or  . nalbuphine (NUBAIN) injection 5 mg  5 mg Subcutaneous Once PRN Leilani Able, MD      . naloxone [facility]) 2 mg in dextrose 5 % 250 mL infusion  1-4 mcg/kg/hr Intravenous Continuous PRN Leilani Able, MD      . naloxone [facility]) injection 0.4 mg  0.4 mg Intravenous PRN Leilani Able, MD       And  . sodium chloride flush (NS) 0.9 % injection 3 mL  3 mL Intravenous PRN Leilani Able, MD      . ondansetron (ZOFRAN) injection 4 mg  4 mg Intravenous Q8H PRN Leilani Able, MD      . oxyCODONE-acetaminophen (PERCOCET/ROXICET) 5-325 MG per tablet 1-2 tablet  1-2 tablet Oral Q6H PRN Levie Heritage, DO      . prenatal multivitamin tablet 1 tablet  1 tablet Oral Q1200 Adam Phenix, MD   1 tablet at 09/25/16 1146  . scopolamine (TRANSDERM-SCOP) 1 MG/3DAYS 1.5 mg  1 patch Transdermal Once Leilani Able, MD      . senna-docusate (Senokot-S) tablet 2 tablet  2 tablet Oral Q24H Adam Phenix, MD   2 tablet at 09/25/16 2322  . simethicone (MYLICON) chewable tablet 80 mg  80 mg Oral TID PC Adam Phenix, MD   80 mg at 09/25/16 1707  . simethicone (MYLICON) chewable tablet 80 mg  80 mg Oral Q24H Adam Phenix, MD   80 mg at 09/25/16 2322  . simethicone (MYLICON) chewable tablet 80 mg  80 mg Oral PRN Adam Phenix, MD      . Tdap Leda Min) injection 0.5 mL  0.5 mL Intramuscular Once Adam Phenix, MD      . zolpidem [facility]) tablet 5 mg  5 mg Oral QHS PRN Adam Phenix, MD        Labs:   Recent Labs Lab 09/22/16 0521 09/25/16 0317 09/25/16 0829  WBC 11.2* 20.0* 17.3*  HGB 10.4* 10.9* 9.4*  HCT 30.0* 31.3* 27.7*  PLT 229 299 275     Recent Labs Lab 09/22/16 0521 09/25/16 0317 09/26/16 0527  NA 136 135 139  K 3.8 4.0 3.6  CL 107 107 104  CO2 22 21* 28   BUN CREATININE 0.78 0.67 0.77  CALCIUM 8.5* 8.6* 6.7*  PROT 5.6* 6.5 5.9*  BILITOT 0.3 0.3 0.3  ALKPHOS 96 103 108  ALT 42 36 30  AST 32 29 29  GLUCOSE 77 96 100*     Radiology: no new imaging  Assessment & Plan:  Pt doing well *Pregnancy:routine care. Pt pumping. BC not d/w pt. She states newborn is doing well.  *Pre-eclampsia with severe features: s/p 24h of PP Mg. Recommend to pt to start low dose bp medication which she is amenable to. Will do norvasc  po qday *PPx: SCDs, OOB ad lib *FEN/GI: regular diet. Saline lock IV *Dispo: POD#3-4. Will need one week BP check after discharge to home.   Cornelia Copa MD Attending Center for [facility])

## 2016-09-26 NOTE — Lactation Note (Signed)
This note was copied from a baby's chart. Lactation Consultation Note  Patient Name: Jennifer Peters [id number] Date: 09/26/2016  Mom discouraged she is not obtaining milk with pumping/hand expression.  Reassured and instructed to continue pumping 8-12 times/24 hours.  She has spoken to [facility] about a pump for after discharge.   Maternal Data    Feeding    LATCH Score/Interventions                      Lactation Tools Discussed/Used     Consult Status      Jennifer Peters 09/26/2016, 9:20 AM

## 2016-09-27 NOTE — Plan of Care (Signed)
Problem: Activity: Goal: Will verbalize the importance of balancing activity with adequate rest periods Outcome: Completed/Met Date Met: 09/27/16 Pt has spent time in NICU today and now wants to rest. Jennifer Peters    Problem: Bowel/Gastric: Goal: Gastrointestinal status will improve Outcome: Progressing Pt states she is passing flatus, no BM since delivery.   Problem: Skin Integrity: Goal: Demonstration of wound healing without infection will improve Outcome: Progressing No signs of delayed wound healing at this time.

## 2016-09-27 NOTE — Progress Notes (Signed)
Daily Postpartum Note  Admission Date: 09/11/2016 Current Date: 09/27/2016 7:03 AM  Edilia Ghuman is a 26 y.o. [id number] POD#2 s/p pLTCS for NRFHTs Pregnancy complicated by: Patient Active Problem List   Diagnosis Date Noted  . Transaminitis 09/18/2016  . 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  . Abnormal umbilical cord dopplers 09/18/2016  . Severe preeclampsia, third trimester 09/11/2016  . Supervision of high risk pregnancy in third trimester 05/02/2016    Overnight/24hr events:  No sentinel events  Subjective:  No s/s of pre-eclampsia; meeting all PO/PP goals including flatus.  Objective:    Current Vital Signs 24h Vital Sign Ranges  T 99.4 F (37.4 C) Temp  Avg: 99 F (37.2 C)  Min: 98.6 F (37 C)  Max: 99.4 F (37.4 C)  BP 110/64 BP  Min: 110/64  Max: 139/92  HR 94 Pulse  Avg: 88.7  Min: 80  Max: 99  RR 18 Resp  Avg: 17.7  Min: 16  Max: 18  SaO2 98 % Not Delivered SpO2  Avg: 98.7 %  Min: 97 %  Max: 100 %       24 Hour I/O Current Shift I/O  Time Ins Outs No intake/output data recorded. No intake/output data recorded.   Patient Vitals for the past 24 hrs:  BP Temp Temp src Pulse Resp SpO2  09/27/16 0504 110/64 99.4 F (37.4 C) Oral 94 18 98 %  09/27/16 0009 137/82 99 F (37.2 C) Oral 99 18 99 %  09/26/16 1958 121/68 99.2 F (37.3 C) Oral 99 18 97 %  09/26/16 1810 120/71 99 F (37.2 C) Oral 88 18 100 %  09/26/16 1200 130/85 98.6 F (37 C) Oral 80 18 98 %  09/26/16 1121 133/86 - - 81 16 -  09/26/16 0800 (!) 139/92 98.6 F (37 C) Oral 80 18 100 %    Physical exam: General: Well nourished, well developed female in no acute distress. Abdomen: gravid, rare BS, NTTP, mildly distended. c/d/I honeycomb Cardiovascular: S1, S2 normal, no murmur, rub or gallop, regular rate and rhythm Respiratory: CTAB Extremities: no clubbing, cyanosis or edema Skin: Warm and dry.   Medications: Current Facility-Administered Medications  Medication Dose Route Frequency Provider Last  Rate Last Dose  . acetaminophen (TYLENOL) tablet 650 mg  650 mg Oral Q4H PRN Adam Phenix, MD      . amLODipine (NORVASC) tablet 5 mg  5 mg Oral Daily [place] Bing, MD   5 mg at 09/26/16 1117  . coconut oil  1 application Topical PRN Adam Phenix, MD      . witch hazel-glycerin (TUCKS) pad 1 application  1 application Topical PRN Adam Phenix, MD       And  . dibucaine (NUPERCAINAL) 1 % rectal ointment 1 application  1 application Rectal PRN Adam Phenix, MD      . diphenhydrAMINE (BENADRYL) capsule 25 mg  25 mg Oral Q6H PRN Adam Phenix, MD      . hydrALAZINE (APRESOLINE) injection 10 mg  10 mg Intravenous Once PRN Adam Phenix, MD      . ibuprofen (ADVIL,MOTRIN) tablet 600 mg  600 mg Oral Q6H Adam Phenix, MD   600 mg at 09/27/16 0543  . labetalol (NORMODYNE,TRANDATE) injection 20-80 mg  20-80 mg Intravenous Q10 min PRN Adam Phenix, MD   20 mg at 09/25/16 0608  . menthol-cetylpyridinium (CEPACOL) lozenge 3 mg  1 lozenge Oral Q2H PRN Adam Phenix, MD      .  nalbuphine (NUBAIN) injection 5 mg  5 mg Intravenous Q4H PRN Leilani Able, MD       Or  . nalbuphine (NUBAIN) injection 5 mg  5 mg Subcutaneous Q4H PRN Leilani Able, MD   5 mg at 09/26/16 0225  . nalbuphine (NUBAIN) injection 5 mg  5 mg Intravenous Once PRN Leilani Able, MD       Or  . nalbuphine (NUBAIN) injection 5 mg  5 mg Subcutaneous Once PRN Leilani Able, MD      . naloxone [facility]) 2 mg in dextrose 5 % 250 mL infusion  1-4 mcg/kg/hr Intravenous Continuous PRN Leilani Able, MD      . naloxone [facility]) injection 0.4 mg  0.4 mg Intravenous PRN Leilani Able, MD       And  . sodium chloride flush (NS) 0.9 % injection 3 mL  3 mL Intravenous PRN Leilani Able, MD      . ondansetron (ZOFRAN) injection 4 mg  4 mg Intravenous Q8H PRN Leilani Able, MD      . oxyCODONE-acetaminophen (PERCOCET/ROXICET) 5-325 MG per tablet 1-2 tablet  1-2 tablet Oral Q6H PRN Levie Heritage, DO   2  tablet at 09/26/16 1910  . prenatal multivitamin tablet 1 tablet  1 tablet Oral Q1200 Adam Phenix, MD   1 tablet at 09/26/16 1117  . scopolamine (TRANSDERM-SCOP) 1 MG/3DAYS 1.5 mg  1 patch Transdermal Once Leilani Able, MD      . senna-docusate (Senokot-S) tablet 2 tablet  2 tablet Oral QHS PRN [place] Bing, MD      . simethicone (MYLICON) chewable tablet 80 mg  80 mg Oral TID PC Adam Phenix, MD   80 mg at 09/26/16 1739  . Tdap (BOOSTRIX) injection 0.5 mL  0.5 mL Intramuscular Once Adam Phenix, MD        Labs:   Recent Labs Lab 09/22/16 [phone] 09/25/16 0317 09/25/16 0829  WBC 11.2* 20.0* 17.3*  HGB 10.4* 10.9* 9.4*  HCT 30.0* 31.3* 27.7*  PLT 229 299 275     Recent Labs Lab 09/22/16 0521 09/25/16 0317 09/26/16 0527  NA 136 135 139  K 3.8 4.0 3.6  CL 107 107 104  CO2 22 21* 28  BUN CREATININE 0.78 0.67 0.77  CALCIUM 8.5* 8.6* 6.7*  PROT 5.6* 6.5 5.9*  BILITOT 0.3 0.3 0.3  ALKPHOS 96 103 108  ALT 42 36 30  AST 32 29 29  GLUCOSE 77 96 100*     Radiology: no new imaging  Assessment & Plan:  Pt doing well *Pregnancy:routine care. Pt pumping. BC not d/w pt. She states newborn is doing well.  *Pre-eclampsia with severe features: s/p 24h of PP Mg. Stable BP on Norvasc  po qday *PPx: SCDs, OOB ad lib *FEN/GI: regular diet. Saline lock IV *Dispo: POD#3. Will need one week BP check after discharge to home.  Desires progestin IUD for contraception.   Tereso Newcomer, MD Attending, Center for [employer] [facility])

## 2016-09-27 NOTE — Lactation Note (Addendum)
This note was copied from a baby's chart. Lactation Consultation Note  Patient Name: Jennifer Peters [id number] Date: 09/27/2016 Reason for consult: Follow-up assessment  NICU baby 32 hours old. Mom states that she has been downstairs to be fitted with a bra. Mom reports that she did not produce milk with first 2 children, and is not seeing any colostrum with this baby. Mom states that she had no breast/areola changes during this or 2 previous pregnancies. Assisted mom with hand expression with no colostrum present. Mom's breasts are round/well-formed with everted nipples. Mom's areola's are compressible, and breast tissue is soft. Enc mom to discuss lack of milk production with her OB. Discussed progression of milk coming to volume and how mom's usually produce more milk after each pregnancy. Enc mom to continue pumping followed by hand expression. Mom reports that she has already discussed getting a WIC pump with the [facility] office. Enc mom to continue offering baby STS as able and mom aware of pumping rooms in the NICU and the need to take her pumping kit with her at D/C. Discussed assessment and interventions with patient's bedside nurse, Lavella Lemons, RN.   Maternal Data    Feeding Feeding Type: Donor Breast Milk Length of feed: 60 min  LATCH Score/Interventions                      Lactation Tools Discussed/Used     Consult Status Consult Status: Follow-up Date: 09/28/16 Follow-up type: In-patient    Andres Labrum 09/27/2016, 12:27 PM

## 2016-09-28 MED ORDER — IBUPROFEN 600 MG PO TABS: 600.0000 mg | ORAL_TABLET | Freq: Four times a day (QID) | ORAL | 0 refills | Status: DC

## 2016-09-28 MED ORDER — AMLODIPINE BESYLATE 5 MG PO TABS: 5.0000 mg | ORAL_TABLET | Freq: Every day | ORAL | 1 refills | Status: DC

## 2016-09-28 MED ORDER — OXYCODONE-ACETAMINOPHEN 5-325 MG PO TABS: 1.0000 | ORAL_TABLET | Freq: Four times a day (QID) | ORAL | 0 refills | Status: DC | PRN

## 2016-09-28 NOTE — Discharge Instructions (Signed)
Levonorgestrel intrauterine device (IUD) °What is this medicine? °LEVONORGESTREL IUD (LEE voe nor jes trel) is a contraceptive (birth control) device. The device is placed inside the uterus by a healthcare professional. It is used to prevent pregnancy. This device can also be used to treat heavy bleeding that occurs during your period. °This medicine may be used for other purposes; ask your health care provider or pharmacist if you have questions. °COMMON BRAND NAME(S): Kyleena, LILETTA, Mirena, Skyla °What should I tell my health care provider before I take this medicine? °They need to know if you have any of these conditions: °-abnormal Pap smear °-cancer of the breast, uterus, or cervix °-diabetes °-endometritis °-genital or pelvic infection now or in the past °-have more than one sexual partner or your partner has more than one partner °-heart disease °-history of an ectopic or tubal pregnancy °-immune system problems °-IUD in place °-liver disease or tumor °-problems with blood clots or take blood-thinners °-seizures °-use intravenous drugs °-uterus of unusual shape °-vaginal bleeding that has not been explained °-an unusual or allergic reaction to levonorgestrel, other hormones, silicone, or polyethylene, medicines, foods, dyes, or preservatives °-pregnant or trying to get pregnant °-breast-feeding °How should I use this medicine? °This device is placed inside the uterus by a health care professional. °Talk to your pediatrician regarding the use of this medicine in children. Special care may be needed. °Overdosage: If you think you have taken too much of this medicine contact a poison control center or emergency room at once. °NOTE: This medicine is only for you. Do not share this medicine with others. °What if I miss a dose? °This does not apply. Depending on the brand of device you have inserted, the device will need to be replaced every 3 to 5 years if you wish to continue using this type of birth  control. °What may interact with this medicine? °Do not take this medicine with any of the following medications: °-amprenavir °-bosentan °-fosamprenavir °This medicine may also interact with the following medications: °-aprepitant °-armodafinil °-barbiturate medicines for inducing sleep or treating seizures °-bexarotene °-boceprevir °-griseofulvin °-medicines to treat seizures like carbamazepine, ethotoin, felbamate, oxcarbazepine, phenytoin, topiramate °-modafinil °-pioglitazone °-rifabutin °-rifampin °-rifapentine °-some medicines to treat HIV infection like atazanavir, efavirenz, indinavir, lopinavir, nelfinavir, tipranavir, ritonavir °-St. John's wort °-warfarin °This list may not describe all possible interactions. Give your health care provider a list of all the medicines, herbs, non-prescription drugs, or dietary supplements you use. Also tell them if you smoke, drink alcohol, or use illegal drugs. Some items may interact with your medicine. °What should I watch for while using this medicine? °Visit your doctor or health care professional for regular check ups. See your doctor if you or your partner has sexual contact with others, becomes HIV positive, or gets a sexual transmitted disease. °This product does not protect you against HIV infection (AIDS) or other sexually transmitted diseases. °You can check the placement of the IUD yourself by reaching up to the top of your vagina with clean fingers to feel the threads. Do not pull on the threads. It is a good habit to check placement after each menstrual period. Call your doctor right away if you feel more of the IUD than just the threads or if you cannot feel the threads at all. °The IUD may come out by itself. You may become pregnant if the device comes out. If you notice that the IUD has come out use a backup birth control method like condoms and call your   health care provider. Using tampons will not change the position of the IUD and are okay to use  during your period. This IUD can be safely scanned with magnetic resonance imaging (MRI) only under specific conditions. Before you have an MRI, tell your healthcare provider that you have an IUD in place, and which type of IUD you have in place. What side effects may I notice from receiving this medicine? Side effects that you should report to your doctor or health care professional as soon as possible: -allergic reactions like skin rash, itching or hives, swelling of the face, lips, or tongue -fever, flu-like symptoms -genital sores -high blood pressure -no menstrual period for 6 weeks during use -pain, swelling, warmth in the leg -pelvic pain or tenderness -severe or sudden headache -signs of pregnancy -stomach cramping -sudden shortness of breath -trouble with balance, talking, or walking -unusual vaginal bleeding, discharge -yellowing of the eyes or skin Side effects that usually do not require medical attention (report to your doctor or health care professional if they continue or are bothersome): -acne -breast pain -change in sex drive or performance -changes in weight -cramping, dizziness, or faintness while the device is being inserted -headache -irregular menstrual bleeding within first 3 to 6 months of use -nausea This list may not describe all possible side effects. Call your doctor for medical advice about side effects. You may report side effects to FDA at 1-800-FDA-1088. Where should I keep my medicine? This does not apply. NOTE: This sheet is a summary. It may not cover all possible information. If you have questions about this medicine, talk to your doctor, pharmacist, or health care provider.  2018 Elsevier/Gold Standard (2016-03-02 14:14:56)  Cesarean Delivery, Care After Refer to this sheet in the next few weeks. These instructions provide you with information about caring for yourself after your procedure. Your health care provider may also give you more  specific instructions. Your treatment has been planned according to current medical practices, but problems sometimes occur. Call your health care provider if you have any problems or questions after your procedure. What can I expect after the procedure? After the procedure, it is common to have:  A small amount of blood or clear fluid coming from the incision.  Some redness, swelling, and pain in your incision area.  Some abdominal pain and soreness.  Vaginal bleeding (lochia).  Pelvic cramps.  Fatigue. Follow these instructions at home: Incision care    Follow instructions from your health care provider about how to take care of your incision. Make sure you:  Wash your hands with soap and water before you change your bandage (dressing). If soap and water are not available, use hand sanitizer.  Change your dressing as told by your health care provider.  Leave stitches (sutures), skin staples, skin glue, or adhesive strips in place. These skin closures may need to stay in place for 2 weeks or longer. If adhesive strip edges start to loosen and curl up, you may trim the loose edges. Do not remove adhesive strips completely unless your health care provider tells you to do that.  Check your incision area every day for signs of infection. Check for:  More redness, swelling, or pain.  More fluid or blood.  Warmth.  Pus or a bad smell.  When you cough or sneeze, hug a pillow. This helps with pain and decreases the chance of your incision opening up (dehiscing). Do this until your incision heals. Medicines   Take over-the-counter and prescription  medicines only as told by your health care provider.  If you were prescribed an antibiotic medicine, take it as told by your health care provider. Do not stop taking the antibiotic until it is finished. Driving   Do not drive or operate heavy machinery while taking prescription pain medicine.  Do not drive for 24 hours if you received  a sedative. Lifestyle   Do not drink alcohol. This is especially important if you are breastfeeding or taking pain medicine.  Do not use tobacco products, including cigarettes, chewing tobacco, or e-cigarettes. If you need help quitting, ask your health care provider. Tobacco can delay wound healing. Eating and drinking   Drink at least 8 eight-ounce glasses of water every day unless told not to by your health care provider. If you breastfeed, you may need to drink more water than this.  Eat high-fiber foods every day. These foods may help prevent or relieve constipation. High-fiber foods include:  Whole grain cereals and breads.  Brown rice.  Beans.  Fresh fruits and vegetables. Activity   Return to your normal activities as told by your health care provider. Ask your health care provider what activities are safe for you.  Rest as much as possible. Try to rest or take a nap while your baby is sleeping.  Do not lift anything that is heavier than your baby or 10 lb (4.5 kg) as told by your health care provider.  Ask your health care provider when you can engage in sexual activity. This may depend on your:  Risk of infection.  Healing rate.  Comfort and desire to engage in sexual activity. Bathing   Do not take baths, swim, or use a hot tub until your health care provider approves. Ask your health care provider if you can take showers. You may only be allowed to take sponge baths until your incision heals.  Keep your dressing dry as told by your health care provider. General instructions   Do not use tampons or douches until your health care provider approves.  Wear:  Loose, comfortable clothing.  A supportive and well-fitting bra.  Watch for any blood clots that may pass from your vagina. These may look like clumps of dark red, brown, or black discharge.  Keep your perineum clean and dry as told by your health care provider.  Wipe from front to back when you use  the toilet.  If possible, have someone help you care for your baby and help with household activities for a few days after you leave the hospital.  Keep all follow-up visits for you and your baby as told by your health care provider. This is important. Contact a health care provider if:  You have:  Bad-smelling vaginal discharge.  Difficulty urinating.  Pain when urinating.  A sudden increase or decrease in the frequency of your bowel movements.  More redness, swelling, or pain around your incision.  More fluid or blood coming from your incision.  Pus or a bad smell coming from your incision.  A fever.  A rash.  Little or no interest in activities you used to enjoy.  Questions about caring for yourself or your baby.  Nausea.  Your incision feels warm to the touch.  Your breasts turn red or become painful or hard.  You feel unusually sad or worried.  You vomit.  You pass large blood clots from your vagina. If you pass a blood clot, save it to show to your health care provider. Do not  flush blood clots down the toilet without showing your health care provider.  You urinate more than usual.  You are dizzy or light-headed.  You have not breastfed and have not had a menstrual period for 12 weeks after delivery.  You stopped breastfeeding and have not had a menstrual period for 12 weeks after stopping breastfeeding. Get help right away if:  You have:  Pain that does not go away or get better with medicine.  Chest pain.  Difficulty breathing.  Blurred vision or spots in your vision.  Thoughts about hurting yourself or your baby.  New pain in your abdomen or in one of your legs.  A severe headache.  You faint.  You bleed from your vagina so much that you fill two sanitary pads in one hour. This information is not intended to replace advice given to you by your health care provider. Make sure you discuss any questions you have with your health care  provider. Document Released: 02/10/2002 Document Revised: 09/29/2015 Document Reviewed: 04/25/2015 Elsevier Interactive Patient Education  2017 [employer].

## 2016-09-28 NOTE — Progress Notes (Signed)
Discharge teaching complete with pt. Pt understood all instructions and did not have any questions. Pt ambulated out of the hospital and discharged home to family.  

## 2016-09-28 NOTE — Lactation Note (Signed)
This note was copied from a baby's chart. Lactation Consultation Note  Patient Name: Jennifer Peters [id number] Date: 09/28/2016 Reason for consult: Follow-up assessment   With this mom of a NICU baby, now 4 hours old, and 31 1/7 weeks CGA, and small at 2 lbs 5.7 oz. I reviewed HE with mom, and was able to have mom collect a few large drops of transitional mil. I reviewed pumping and supply and demand with mom, and she will get a pump from [facility] later today. Mom knows to call for questions/concernsk her baby's nurse to call lactation as needed, for mom.    Maternal Data    Feeding Feeding Type: Donor Breast Milk Length of feed: 90 min  LATCH Score/Interventions                      Lactation Tools Discussed/Used WIC Program: Yes (mom has a 2 pm appointmetn to recieve and DEP today. I called [facility] and she soke to mom . )   Consult Status Consult Status: PRN Follow-up type: In-patient (NICU)    Alfred Levins 09/28/2016, 11:09 AM

## 2016-09-28 NOTE — Discharge Summary (Signed)
OB Discharge Summary     Patient Name: Jennifer Peters DOB: [birth year] MRN: [id number]  Date of admission: 09/11/2016 Delivering MD: Adam Phenix   Date of discharge: 09/28/2016  Admitting diagnosis: 28 WEEKS PRESSURE PAIN BACK RT SIDE  Intrauterine pregnancy: [redacted]w[redacted]d     Secondary diagnosis:  Principal Problem:   Severe preeclampsia, third trimester Active Problems:   Supervision of high risk pregnancy in third trimester   Transaminitis   Megaloblastic anemia   Abnormal umbilical cord dopplers  Additional problems: n/a     Discharge diagnosis: Preterm Pregnancy Delivered                                                                                                Post partum procedures: none  Augmentation: n/a  Complications: None  Hospital course:  Onset of Labor With Unplanned C/S  26 y.o. yo [id number] at [redacted]w[redacted]d was admitted for management of preeclampsia with severe features on 4/10. She was on po antihypertensive meds. She had abnormal UA dopplers. On th e28th she developed CatIII FHr and was taken for a caesarean section.  The patient went for cesarean section due to Non-Reassuring FHR, and delivered a Viable infant,09/25/2016  Details of operation can be found in separate operative note. Patient had an uncomplicated postpartum course.  She is ambulating,tolerating a regular diet, passing flatus, and urinating well.  Patient is discharged home in stable condition 09/30/16.  Physical exam  Vitals:   09/27/16 1200 09/27/16 1600 09/27/16 1944 09/28/16 0005  BP: 132/89 (!) 136/95 132/82 125/74  Pulse: 93 (!) 110 96 (!) 107  Resp: Temp: 99.1 F (37.3 C) 98.8 F (37.1 C) 98.4 F (36.9 C) 98.7 F (37.1 C)  TempSrc: Oral Oral Oral Oral  SpO2: 100% 97% 96% 98%  Weight:      Height:       General: alert, cooperative and no distress Lochia: appropriate Uterine Fundus: firm Incision: Healing well with no significant drainage, Dressing is clean, dry, and  intact DVT Evaluation: No evidence of DVT seen on physical exam. Labs: Lab Results  Component Value Date   WBC 17.3 (H) 09/25/2016   HGB 9.4 (L) 09/25/2016   HCT 27.7 (L) 09/25/2016   MCV 104.5 (H) 09/25/2016   PLT 275 09/25/2016   CMP Latest Ref Rng & Units 09/26/2016  Glucose 65 - 99 mg/dL 147(W)  BUN 6 - 20 mg/dL 10  Creatinine 2.95 - 6.21 mg/dL 3.08  Sodium 657 - 846 mmol/L 139  Potassium 3.5 - 5.1 mmol/L 3.6  Chloride 101 - 111 mmol/L 104  CO2 22 - 32 mmol/L 28  Calcium 8.9 - 10.3 mg/dL 6.7(L)  Total Protein 6.5 - 8.1 g/dL 5.9(L)  Total Bilirubin 0.3 - 1.2 mg/dL 0.3  Alkaline Phos 38 - 126 U/L 108  AST 15 - 41 U/L 29  ALT 14 - 54 U/L 30    Discharge instruction: per After Visit Summary and "Baby and Me Booklet".  After visit meds:  Allergies as of 09/28/2016      Reactions   Latex Anaphylaxis, Itching,  Rash      Medication List    STOP taking these medications   acetaminophen 500 MG tablet Commonly known as:  TYLENOL   aspirin 81 MG chewable tablet   labetalol 200 MG tablet Commonly known as:  NORMODYNE     TAKE these medications   amLODipine 5 MG tablet Commonly known as:  NORVASC Take 1 tablet (5 mg total) by mouth daily.   ibuprofen 600 MG tablet Commonly known as:  ADVIL,MOTRIN Take 1 tablet (600 mg total) by mouth every 6 (six) hours.   oxyCODONE-acetaminophen 5-325 MG tablet Commonly known as:  PERCOCET/ROXICET Take 1-2 tablets by mouth every 6 (six) hours as needed for moderate pain.   prenatal multivitamin Tabs tablet Take 1 tablet by mouth daily at 12 noon.   Wrist Brace/Right Medium Misc 1 Device by Does not apply route daily.       Diet: routine diet  Activity: Advance as tolerated. Pelvic rest for 6 weeks.   Outpatient follow up:2 weeks Follow up Appt:No future appointments. Follow up Visit:No Follow-up on file.  Postpartum contraception: IUD Mirena  Newborn Data: Live born female  Birth Weight: 2 lb 4.7 oz (1040 g) APGAR:  4, 9  Baby Feeding: Breast Disposition:NICU   09/28/2016 Willodean Rosenthal, MD

## 2016-10-24 ENCOUNTER — Ambulatory Visit: Payer: Medicaid Other | Admitting: Obstetrics and Gynecology

## 2016-11-06 ENCOUNTER — Ambulatory Visit (INDEPENDENT_AMBULATORY_CARE_PROVIDER_SITE_OTHER): Payer: Medicaid Other | Admitting: Certified Nurse Midwife

## 2016-11-06 ENCOUNTER — Encounter: Payer: Self-pay | Admitting: *Deleted

## 2016-11-06 ENCOUNTER — Encounter: Payer: Self-pay | Admitting: Certified Nurse Midwife

## 2016-11-06 DIAGNOSIS — Z30430 Encounter for insertion of intrauterine contraceptive device: Secondary | ICD-10-CM | POA: Diagnosis not present

## 2016-11-06 DIAGNOSIS — Z01812 Encounter for preprocedural laboratory examination: Secondary | ICD-10-CM | POA: Diagnosis not present

## 2016-11-06 LAB — POCT URINE PREGNANCY: PREG TEST UR: NEGATIVE

## 2016-11-06 MED ORDER — IBUPROFEN 800 MG PO TABS: 800.0000 mg | ORAL_TABLET | Freq: Three times a day (TID) | ORAL | 1 refills | Status: DC | PRN

## 2016-11-06 MED ORDER — LEVONORGESTREL 19.5 MG IU IUD
19.5000 mg | INTRAUTERINE_SYSTEM | Freq: Once | INTRAUTERINE | Status: AC
  Administered 2016-11-06: 19.5 mg via INTRAUTERINE

## 2016-11-06 NOTE — Progress Notes (Signed)
[facility]  Patient is here for her 6 week Post Partum Exam.   Jennifer SettingMaria Peters is a 26 y.o. [phone] female who presents for a postpartum visit. She is 6 weeks postpartum following a low cervical transverse Cesarean section. I have fully reviewed the prenatal and intrapartum course. The delivery was at 30.5 gestational weeks.  Anesthesia: spinal. Postpartum course has been normal. Baby's course has been abnormal: NICU admission for prematurity. Baby is feeding by breast. Bleeding no bleeding. Bowel function is normal. Bladder function is normal. Patient is not sexually active. Contraception method is IUD. Postpartum depression screening:neg  The following portions of the patient's history were reviewed and updated as appropriate: allergies, current medications, past family history, past medical history, past social history, past surgical history and problem list.  Review of Systems Pertinent items noted in HPI and remainder of comprehensive ROS otherwise negative.    Objective:  unknown if currently breastfeeding.  General:  alert, cooperative and no distress   Breasts:  inspection negative, no nipple discharge or bleeding, no masses or nodularity palpable  Lungs: clear to auscultation bilaterally  Heart:  regular rate and rhythm, S1, S2 normal, no murmur, click, rub or gallop  Abdomen: soft, non-tender; bowel sounds normal; no masses,  no organomegaly   Vulva:  normal  Vagina: normal vagina, no discharge, exudate, lesion, or erythema  Cervix:  no cervical motion tenderness  Corpus: normal size, contour, position, consistency, mobility, non-tender  Adnexa:  not evaluated  Rectal Exam: Not performed.        Assessment:    Normal 6 week postpartum exam. Pap smear not done at today's visit.   Plan:   1. Contraception: IUD placed today 2.  Last pap smear was normal 05/02/16.  3. Follow up in: 1 month for a string check or as needed.

## 2016-11-06 NOTE — Progress Notes (Signed)
IUD Procedure Note   DIAGNOSIS: Desires long-term, reversible contraception   PROCEDURE: IUD placement Performing Provider: Orvilla Cornwallachelle Brinsley Wence CNM  Patient counseled prior to procedure. I explained risks and benefits of Kyleena IUD, reviewed alternative forms of contraception. Patient stated understanding and consented to continue with procedure.   LMP: unknown, postpartum and breast feeding.  Pregnancy Test: Negative Lot #: [id number] Expiration Date: September 2019   IUD type: [    ] Mirena   [   ] Paragard  [   ] Lyletta   [X]   Kyleena  PROCEDURE:  Timeout procedure was performed to ensure right patient and right site.  A bimanual exam was performed to determine the position of the uterus, retroverted. The speculum was placed. The vagina and cervix was sterilized in the usual manner and sterile technique was maintained throughout the course of the procedure. A single toothed tenaculum was applied to the posterior lip of the cervix and gentle traction applied. The depth of the uterus was sounded to 10 cm. With gentle traction on the tenaculum, the IUD was inserted to the appropriate depth and inserted without difficulty.  The string was cut to an estimated 4 cm length. Bleeding was minimal. The patient tolerated the procedure well.   Follow up: The patient tolerated the procedure well without complications.  Standard post-procedure care is explained and return precautions are given.  Orvilla Cornwallachelle Alanah Sakuma CNM

## 2016-11-09 NOTE — Addendum Note (Signed)
Addendum  created 11/09/16 1202 by Rayvion Stumph, MD   Sign clinical note    

## 2016-12-04 ENCOUNTER — Ambulatory Visit: Payer: Medicaid Other | Admitting: Certified Nurse Midwife

## 2016-12-19 ENCOUNTER — Ambulatory Visit: Payer: Medicaid Other | Admitting: Certified Nurse Midwife

## 2018-04-02 ENCOUNTER — Ambulatory Visit: Payer: Self-pay | Admitting: Physician Assistant

## 2018-05-15 ENCOUNTER — Ambulatory Visit: Payer: Self-pay | Admitting: Nurse Practitioner

## 2018-05-26 ENCOUNTER — Ambulatory Visit: Payer: Self-pay | Admitting: Nurse Practitioner

## 2018-06-05 ENCOUNTER — Telehealth: Payer: Self-pay

## 2018-06-05 NOTE — Telephone Encounter (Signed)
Pt called stating that she has started having intense lower abdominal cramping with no bleeding within the past week. She states that she has an IUD and is able to feel her strings.  Pt would like to know what she can do for the pain. Please advise

## 2018-06-05 NOTE — Telephone Encounter (Signed)
Patient should go to [facility].

## 2018-06-10 NOTE — Telephone Encounter (Signed)
Pt states that she the pain resolved. She states that her cycle started the next day.

## 2018-06-30 IMAGING — US US MFM OB FOLLOW-UP
1 series · 14 of 28 positions shown · non-contrast
Comparison: none

Road [HOSPITAL]

Indications
28 weeks gestation of pregnancy
Pre-eclampsia (third trimester)
OB History
Blood Type:            Height:  5'2"   Weight (lb):  143      BMI:
Gravidity:    4         Term:   2         SAB:   1
Living:       2
Fetal Evaluation
Num Of Fetuses:     1
Fetal Heart         140
Rate(bpm):
Cardiac Activity:   Observed
Presentation:       Cephalic
Placenta:           Anterior, above cervical os
P. Cord Insertion:  Previously Visualized
Amniotic Fluid
AFI FV:      Subjectively within normal limits
AFI Sum(cm)     %Tile       Largest Pocket(cm)
20.35           81
RUQ(cm)       RLQ(cm)       LUQ(cm)        LLQ(cm)
5.62
Biometry
BPD:      70.5  mm     G. Age:  28w 2d         25  %    CI:         77.5   %   70 - 86
FL/HC:      20.3   %   19.6 -
HC:      253.5  mm     G. Age:  27w 4d        < 3  %    HC/AC:      1.10       0.99 -
AC:      230.8  mm     G. Age:  27w 3d         12  %    FL/BPD:     73.0   %   71 - 87
FL:       51.5  mm     G. Age:  27w 4d          9  %    FL/AC:      22.3   %   20 - 24
HUM:      47.4  mm     G. Age:  27w 6d         27  %
Est. FW:    9846  gm      2 lb 6 oz     28  %
Gestational Age
LMP:           27w 6d       Date:   02/29/16                 EDD:   12/05/16
U/S Today:     27w 5d                                        EDD:   12/06/16
Best:          28w 5d    Det. By:   Early Ultrasound         EDD:   11/29/16
(05/15/16)
Anatomy
Cranium:               Appears normal         Aortic Arch:            Previously seen
Cavum:                 Appears normal         Ductal Arch:            Previously seen
Ventricles:            Appears normal         Diaphragm:              Appears normal
Choroid Plexus:        Previously seen        Stomach:                Present but small
Cerebellum:            Previously seen        Abdomen:                Previously seen
Posterior Fossa:       Previously seen        Abdominal Wall:         Previously seen
Nuchal Fold:           Previously seen        Cord Vessels:           Previously seen
Face:                  Orbits and profile     Kidneys:                Appear normal
previously seen
Lips:                  Previously seen        Bladder:                Appears normal
Thoracic:              Appears normal         Spine:                  Previously seen
Heart:                 Appears normal         Upper Extremities:      Previously seen
(4CH, axis, and situs
RVOT:                  Previously seen        Lower Extremities:      Previously seen
LVOT:                  Previously seen
Other:  Fetus appears to be a male. Heels and 5th digit previously visualized.
Cervix Uterus Adnexa
Cervix
Length:           4.48  cm.
Normal appearance by transabdominal scan.
Uterus
No abnormality visualized.
Left Ovary
Not visualized.
Right Ovary
Impression
INDICATION: 25 yr old [id number] at 22w0d with preeclampsia
with severe features for fetal growth.

[Series 1: us mfm ob follow-up · 58 acquisitions, 14 frames shown]
[im 3/58]
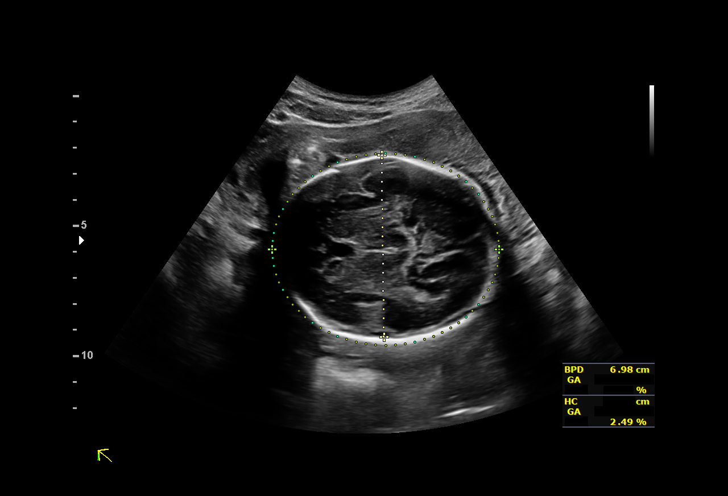
[im 7/58]
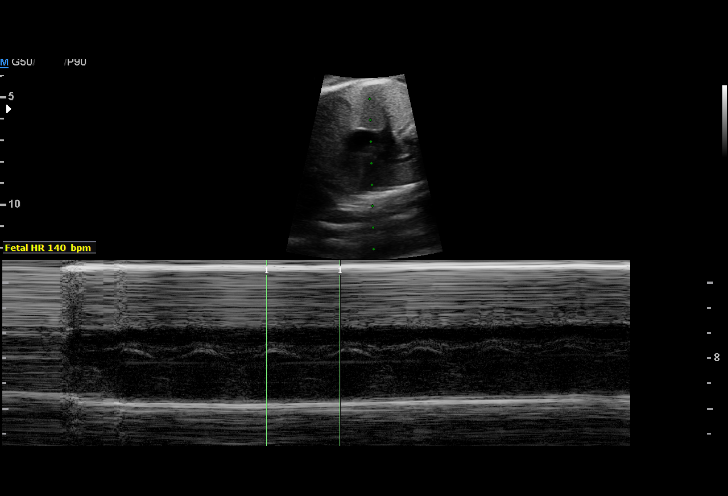
[im 11/58]
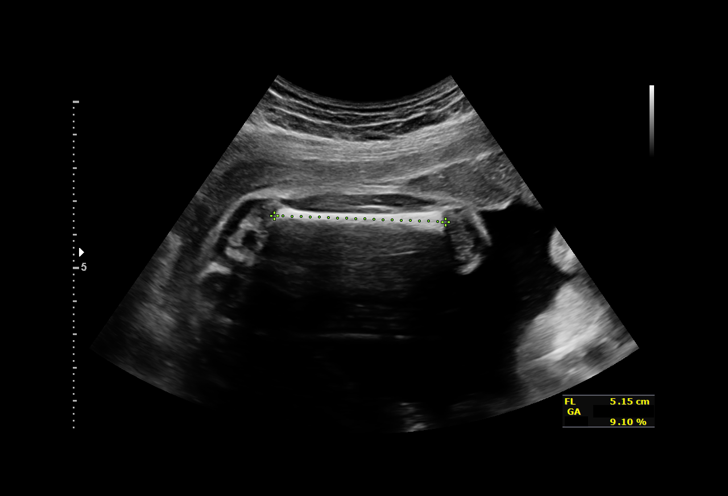
[im 15/58]
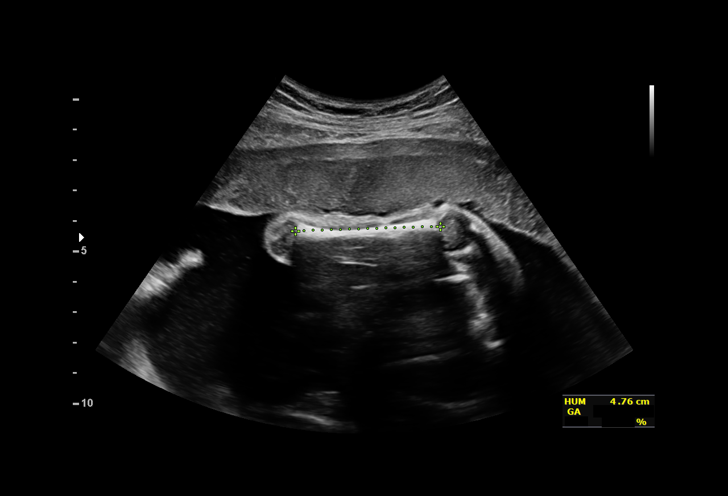
[im 20/58]
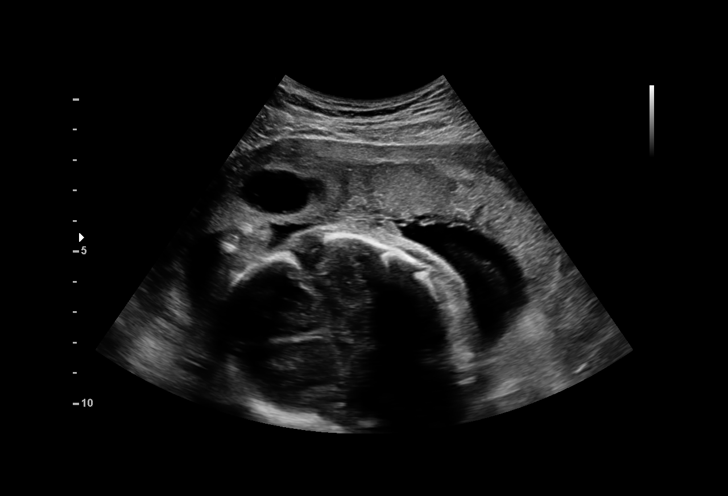
[im 24/58]
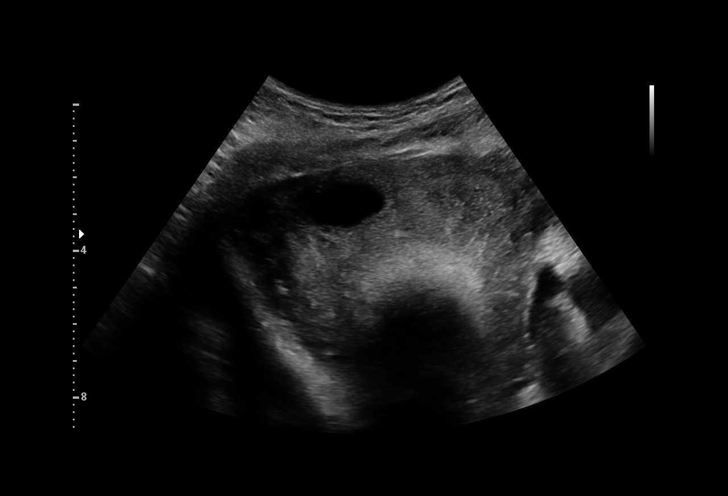
[im 28/58]
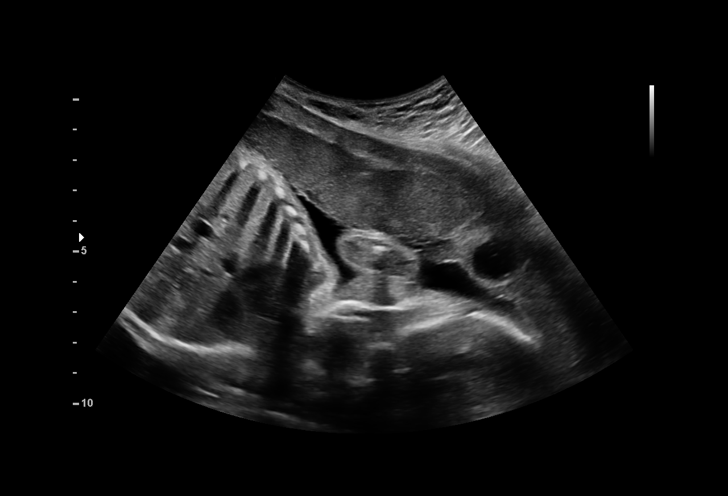
[im 32/58]
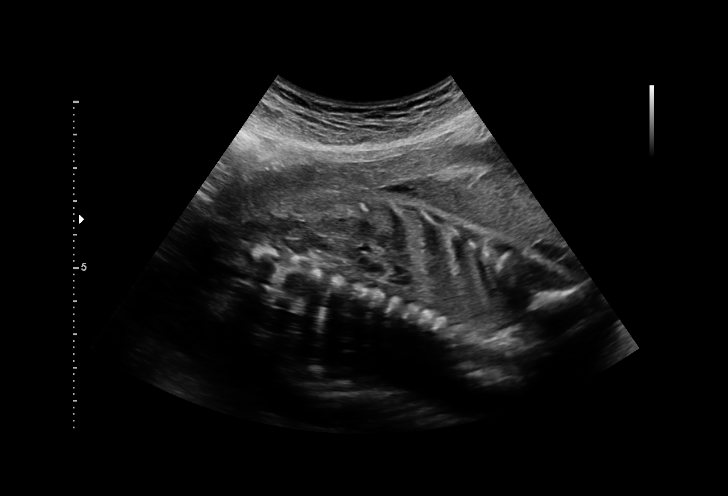
[im 36/58]
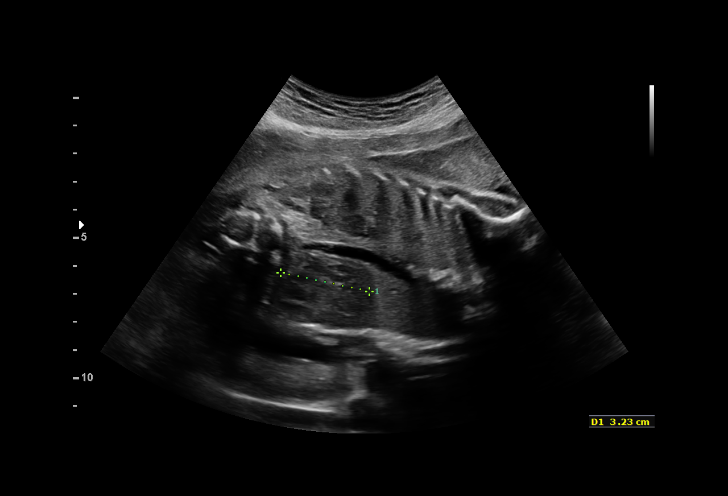
[im 41/58]
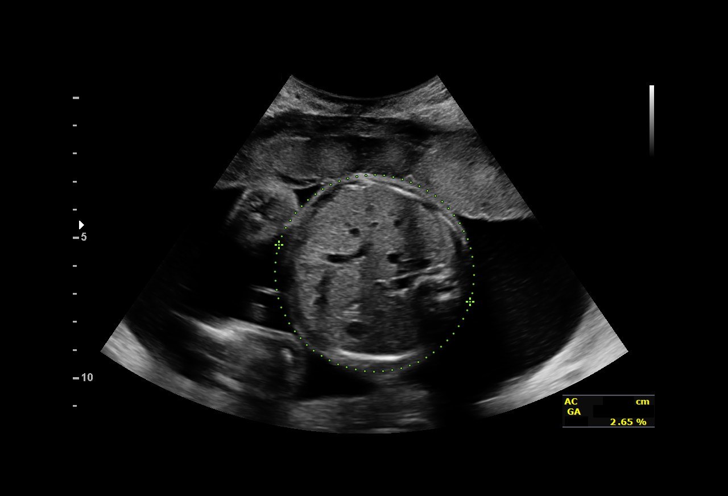
[im 45/58]
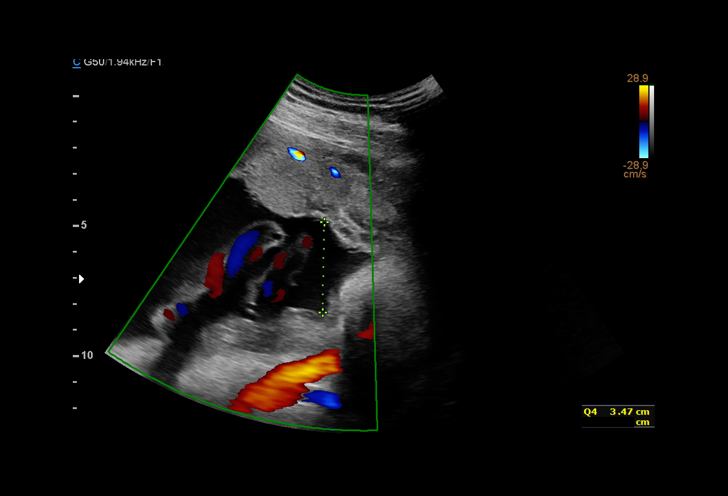
[im 49/58]
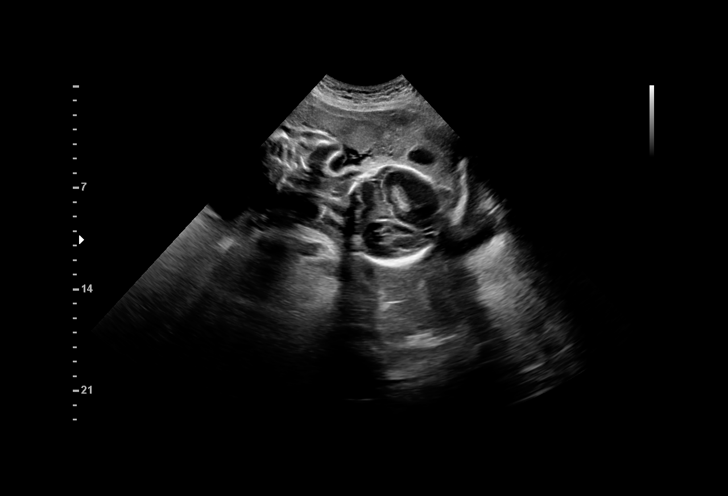
[im 53/58]
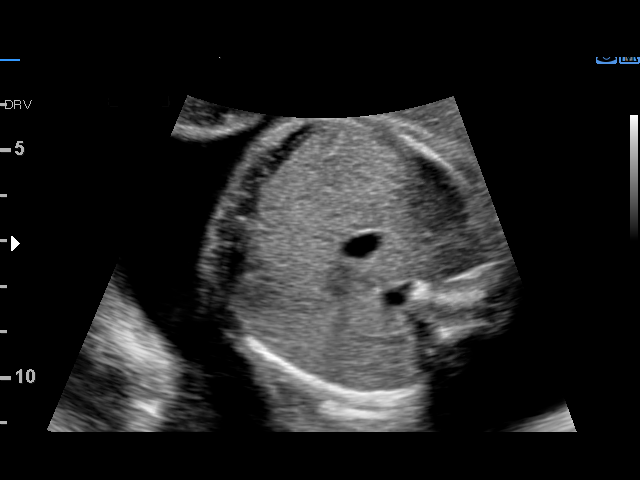
[im 58/58]
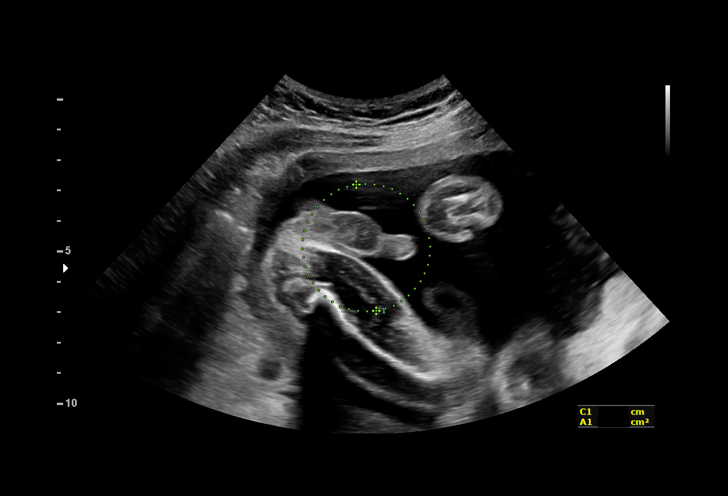

[14 of 28 positions shown; findings below may reference images not displayed]

FINDINGS: 1. Single intrauterine pregnancy.
2. Estimated fetal weight is in the 28th%.
3. Anterior placenta without evidence of previa.
4. Normal amniotic fluid index.
5. The limited anatomy survey is normal.
Recommendations

1. Appropriate fetal growth.
2. Low risk cell free fetal DNA.
3. Preeclampsia with severe features:
- no consult requested
- recommend fetal growth in 3-4 weeks
- management per primary OB
- on labetalol

## 2018-08-11 ENCOUNTER — Emergency Department (HOSPITAL_COMMUNITY)
Admission: EM | Admit: 2018-08-11 | Discharge: 2018-08-11 | Disposition: A | Discharge status: Home / Self Care | Payer: Self-pay | Attending: Emergency Medicine | Admitting: Emergency Medicine

## 2018-08-11 ENCOUNTER — Other Ambulatory Visit: Payer: Self-pay

## 2018-08-11 ENCOUNTER — Encounter (HOSPITAL_COMMUNITY): Payer: Self-pay | Admitting: *Deleted

## 2018-08-11 DIAGNOSIS — N390 Urinary tract infection, site not specified: Secondary | ICD-10-CM | POA: Insufficient documentation

## 2018-08-11 DIAGNOSIS — Z91040 Latex allergy status: Secondary | ICD-10-CM | POA: Insufficient documentation

## 2018-08-11 DIAGNOSIS — Z87891 Personal history of nicotine dependence: Secondary | ICD-10-CM | POA: Insufficient documentation

## 2018-08-11 DIAGNOSIS — Z79899 Other long term (current) drug therapy: Secondary | ICD-10-CM | POA: Insufficient documentation

## 2018-08-11 LAB — URINALYSIS, ROUTINE W REFLEX MICROSCOPIC
BILIRUBIN URINE: NEGATIVE
GLUCOSE, UA: NEGATIVE mg/dL
KETONES UR: NEGATIVE mg/dL
NITRITE: NEGATIVE
PH: 7 (ref 5.0–8.0)
PROTEIN: 30 mg/dL — AB
RBC / HPF: >50 RBC/hpf — ABNORMAL HIGH (ref 0–5)
Specific Gravity, Urine: 1.02 (ref 1.005–1.030)

## 2018-08-11 MED ORDER — CEPHALEXIN 250 MG PO CAPS: 250.0000 mg | ORAL_CAPSULE | Freq: Four times a day (QID) | ORAL | 0 refills | Status: AC

## 2018-08-11 NOTE — ED Provider Notes (Signed)
[facility] EMERGENCY DEPARTMENT Provider Note   CSN: [id number] Arrival date & time: 08/11/18  0847    History   Chief Complaint Chief Complaint  Patient presents with  . Urinary Frequency    HPI Jennifer Peters is a 28 y.o. female presenting for evaluation of urinary frequency.  Patient states started last night, he has been having urinary frequency.  She did wake up to urinate multiple times last night.  She reports a mild tingling, but no burning.  She denies hematuria.  Patient states she has had 3 UTIs in the past, they have all presented differently.  Patient states over the past several months, she has been having intermittent abdominal pain upon waking, which resolves with eating.  No pain currently.  Patient has not taken anything for her symptoms.  She denies fevers, chills, nausea, vomiting, abnormal bowel movements.  Patient denies vaginal discharge.  She had an IUD placed 11/2017.  Last period was earlier this month, it was normal for her.     HPI  Past Medical History:  Diagnosis Date  . Abnormal Pap smear of cervix    unknown stage of abnormality  . Anemia   . Hx of chlamydia infection   . Low vitamin D level 05/10/2016  . Post partum depression     Patient Active Problem List   Diagnosis Date Noted  . Encounter for IUD insertion 11/06/2016  . 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 CESAREAN SECTION N/A 09/25/2016   Procedure: CESAREAN SECTION;  Surgeon: Adam Phenix, MD;  Location: [facility] BIRTHING SUITES;  Service: Obstetrics;  Laterality: N/A;  . DILATION AND CURETTAGE OF UTERUS    . LAPAROSCOPIC APPENDECTOMY  06/14/2012   Procedure: APPENDECTOMY LAPAROSCOPIC;  Surgeon: Clovis Pu. Cornett, MD;  Location: [facility] OR;  Service: General;  Laterality: N/A;  . wisdom tooth extracted x1 in 04/2011       OB History    Gravida  4   Para  3   Term  2   Preterm  1   AB  1   Living  2     SAB  1    TAB  0   Ectopic  0   Multiple  0   Live Births  2            Home Medications    Prior to Admission medications   Medication Sig Start Date End Date Taking? Authorizing Provider  amLODipine (NORVASC) 5 MG tablet Take 1 tablet (5 mg total) by mouth daily. 09/28/16   Willodean Rosenthal, MD  cephALEXin (KEFLEX) 250 MG capsule Take 1 capsule (250 mg total) by mouth 4 (four) times daily for 5 days. 08/11/18 08/16/18  Maylene Crocker, PA-C  ibuprofen (ADVIL,MOTRIN) 800 MG tablet Take 1 tablet (800 mg total) by mouth every 8 (eight) hours as needed. 11/06/16   Roe Coombs, CNM  Prenatal Vit-Fe Fumarate-FA (PRENATAL MULTIVITAMIN) TABS tablet Take 1 tablet by mouth daily at 12 noon.    [provider]  diphenhydrAMINE (BENADRYL) 25 MG tablet Take 1 tablet (25 mg total) by mouth every 6 (six) hours. Patient not taking: Reported on 05/28/2014 05/23/14 09/28/14  Joycie Peek, PA-C    Family History Family History  Problem Relation Age of Onset  . Kidney disease Maternal Grandmother   . Heart disease Maternal Grandmother        chf  . Diabetes Maternal Aunt   . Hypertension  Mother   . Other Neg Hx     Social History Social History   Tobacco Use  . Smoking status: Former Smoker    Packs/day: 0.50    Years: 3.00    Pack years: 1.50    Last attempt to quit: 08/21/2011    Years since quitting: 6.9  . Smokeless tobacco: Never Used  Substance Use Topics  . Alcohol use: No  . Drug use: No     Allergies   Latex   Review of Systems Review of Systems  Constitutional: Negative for fever.  Genitourinary: Positive for frequency. Negative for hematuria and vaginal discharge.       Tingling with urination     Physical Exam Updated Vital Signs BP 111/77 (BP Location: Right Arm)   Pulse 60   Temp 97.9 F (36.6 C) (Oral)   Resp 18   SpO2 99%   Physical Exam Vitals signs and nursing note reviewed.  Constitutional:      General: She is not in acute  distress.    Appearance: She is well-developed.     Comments: Appears nontoxic  HENT:     Head: Normocephalic and atraumatic.  Neck:     Musculoskeletal: Normal range of motion.  Pulmonary:     Effort: Pulmonary effort is normal.  Abdominal:     General: There is no distension.     Palpations: Abdomen is soft. There is no mass.     Tenderness: There is no abdominal tenderness. There is no guarding or rebound.     Comments: No ttp of the abd. Soft without rigidity, guarding, or distention. Negative rebound. No cva or flank tenderness  Musculoskeletal: Normal range of motion.  Skin:    General: Skin is warm.     Findings: No rash.  Neurological:     Mental Status: She is alert and oriented to person, place, and time.      ED Treatments / Results  Labs (all labs ordered are listed, but only abnormal results are displayed) Labs Reviewed  URINALYSIS, ROUTINE W REFLEX MICROSCOPIC - Abnormal; Notable for the following components:      Result Value   APPearance CLOUDY (*)    Hgb urine dipstick MODERATE (*)    Protein, ur 30 (*)    Leukocytes,Ua LARGE (*)    RBC / HPF >50 (*)    WBC, UA >50 (*)    Bacteria, UA MANY (*)    Non Squamous Epithelial 0-5 (*)    All other components within normal limits    EKG None  Radiology No results found.  Procedures Procedures (including critical care time)  Medications Ordered in ED Medications - No data to display   Initial Impression / Assessment and Plan / ED Course  I have reviewed the triage vital signs and the nursing notes.  Pertinent labs & imaging results that were available during my care of the patient were reviewed by me and considered in my medical decision making (see chart for details).        Patient presenting for evaluation of urinary frequency.  Physical examination, she is afebrile not tachycardic.  Appears nontoxic.  She is reporting urinary symptoms, will order UA.  No vaginal discharge or abdominal pain.   Low suspicion for infection/emergency.  Low suspicion for PID, TOA, or torsion.   Urine positive for infection.  Discussed findings with patient.  Will treat with Keflex, and have patient follow-up with OB/GYN as needed.  At this time, patient appears  safe for discharge.  Return precautions given.  Patient she states understands and agrees to plan.   Final Clinical Impressions(s) / ED Diagnoses   Final diagnoses:  Urinary tract infection without hematuria, site unspecified    ED Discharge Orders         Ordered    cephALEXin (KEFLEX) 250 MG capsule  4 times daily     08/11/18 [address] [place], PA-C 08/11/18 1014    Azalia Bilis, MD 08/11/18 1552

## 2018-08-11 NOTE — Discharge Instructions (Addendum)
Take antibiotics as prescribed.  Take the entire course, even if your symptoms improve. Use Tylenol and ibuprofen as needed for pain. Use heating pads to help with pain. Make sure you are staying well-hydrated water.  Your urine should be clear to pale yellow. Follow-up with your OB/GYN as needed if symptoms not improving. Return to the emergency room if you develop any new, worsening, concerning symptoms.

## 2018-08-11 NOTE — ED Triage Notes (Signed)
Reports frequent urination since last night.

## 2018-09-15 ENCOUNTER — Other Ambulatory Visit: Payer: Self-pay

## 2018-09-15 ENCOUNTER — Encounter (HOSPITAL_COMMUNITY): Payer: Self-pay | Admitting: Emergency Medicine

## 2018-09-15 ENCOUNTER — Emergency Department (HOSPITAL_COMMUNITY)
Admission: EM | Admit: 2018-09-15 | Discharge: 2018-09-15 | Disposition: A | Discharge status: Home / Self Care | Payer: Medicaid Other | Attending: Emergency Medicine | Admitting: Emergency Medicine

## 2018-09-15 DIAGNOSIS — Z87891 Personal history of nicotine dependence: Secondary | ICD-10-CM | POA: Insufficient documentation

## 2018-09-15 DIAGNOSIS — Z91040 Latex allergy status: Secondary | ICD-10-CM | POA: Insufficient documentation

## 2018-09-15 DIAGNOSIS — K529 Noninfective gastroenteritis and colitis, unspecified: Secondary | ICD-10-CM | POA: Diagnosis not present

## 2018-09-15 DIAGNOSIS — R112 Nausea with vomiting, unspecified: Secondary | ICD-10-CM

## 2018-09-15 DIAGNOSIS — Z79899 Other long term (current) drug therapy: Secondary | ICD-10-CM | POA: Insufficient documentation

## 2018-09-15 DIAGNOSIS — R197 Diarrhea, unspecified: Secondary | ICD-10-CM

## 2018-09-15 DIAGNOSIS — R1110 Vomiting, unspecified: Secondary | ICD-10-CM | POA: Diagnosis present

## 2018-09-15 LAB — URINALYSIS, ROUTINE W REFLEX MICROSCOPIC
Bilirubin Urine: NEGATIVE
Glucose, UA: NEGATIVE mg/dL
Hgb urine dipstick: NEGATIVE
Ketones, ur: NEGATIVE mg/dL
Leukocytes,Ua: NEGATIVE
Nitrite: NEGATIVE
Protein, ur: 30 mg/dL — AB
Specific Gravity, Urine: 1.026 (ref 1.005–1.030)
pH: 6 (ref 5.0–8.0)

## 2018-09-15 LAB — COMPREHENSIVE METABOLIC PANEL
ALT: 18 U/L (ref 0–44)
AST: 22 U/L (ref 15–41)
Albumin: 4.5 g/dL (ref 3.5–5.0)
Alkaline Phosphatase: 38 U/L (ref 38–126)
Anion gap: 9 (ref 5–15)
BUN: 12 mg/dL (ref 6–20)
CO2: 20 mmol/L — ABNORMAL LOW (ref 22–32)
Calcium: 8.9 mg/dL (ref 8.9–10.3)
Chloride: 108 mmol/L (ref 98–111)
Creatinine, Ser: 0.68 mg/dL (ref 0.44–1.00)
GFR calc Af Amer: >60 mL/min (ref >60)
GFR calc non Af Amer: >60 mL/min (ref >60)
Glucose, Bld: 148 mg/dL — ABNORMAL HIGH (ref 70–99)
Potassium: 3.2 mmol/L — ABNORMAL LOW (ref 3.5–5.1)
Sodium: 137 mmol/L (ref 135–145)
Total Bilirubin: 0.6 mg/dL (ref 0.3–1.2)
Total Protein: 7.6 g/dL (ref 6.5–8.1)

## 2018-09-15 LAB — CBC WITH DIFFERENTIAL/PLATELET
Abs Immature Granulocytes: 0.07 10*3/uL (ref 0.00–0.07)
Basophils Absolute: 0 10*3/uL (ref 0.0–0.1)
Basophils Relative: 0 %
Eosinophils Absolute: 0.1 10*3/uL (ref 0.0–0.5)
Eosinophils Relative: 0 %
HCT: 36.8 % (ref 36.0–46.0)
Hemoglobin: 11.9 g/dL — ABNORMAL LOW (ref 12.0–15.0)
Immature Granulocytes: 1 %
Lymphocytes Relative: 18 %
Lymphs Abs: 2.6 10*3/uL (ref 0.7–4.0)
MCH: 35.1 pg — ABNORMAL HIGH (ref 26.0–34.0)
MCHC: 32.3 g/dL (ref 30.0–36.0)
MCV: 108.6 fL — ABNORMAL HIGH (ref 80.0–100.0)
Monocytes Absolute: 1 10*3/uL (ref 0.1–1.0)
Monocytes Relative: 7 %
Neutro Abs: 10.2 10*3/uL — ABNORMAL HIGH (ref 1.7–7.7)
Neutrophils Relative %: 74 %
Platelets: 236 10*3/uL (ref 150–400)
RBC: 3.39 MIL/uL — ABNORMAL LOW (ref 3.87–5.11)
RDW: 11.2 % — ABNORMAL LOW (ref 11.5–15.5)
WBC: 13.8 10*3/uL — ABNORMAL HIGH (ref 4.0–10.5)
nRBC: 0 % (ref 0.0–0.2)

## 2018-09-15 LAB — LIPASE, BLOOD: Lipase: 35 U/L (ref 11–51)

## 2018-09-15 LAB — PREGNANCY, URINE: Preg Test, Ur: NEGATIVE

## 2018-09-15 MED ORDER — DICYCLOMINE HCL 10 MG/ML IM SOLN
20.0000 mg | Freq: Once | INTRAMUSCULAR | Status: AC
  Administered 2018-09-15: 20 mg via INTRAMUSCULAR
  Filled 2018-09-15: qty 2

## 2018-09-15 MED ORDER — POTASSIUM CHLORIDE CRYS ER 20 MEQ PO TBCR
40.0000 meq | EXTENDED_RELEASE_TABLET | Freq: Once | ORAL | Status: AC
  Administered 2018-09-15: 40 meq via ORAL
  Filled 2018-09-15: qty 2

## 2018-09-15 MED ORDER — PROMETHAZINE HCL 25 MG PO TABS: 25.0000 mg | ORAL_TABLET | Freq: Four times a day (QID) | ORAL | 0 refills | Status: DC | PRN

## 2018-09-15 MED ORDER — SODIUM CHLORIDE 0.9 % IV BOLUS
1000.0000 mL | Freq: Once | INTRAVENOUS | Status: AC
  Administered 2018-09-15: 1000 mL via INTRAVENOUS

## 2018-09-15 MED ORDER — DICYCLOMINE HCL 20 MG PO TABS: 20.0000 mg | ORAL_TABLET | Freq: Two times a day (BID) | ORAL | 0 refills | Status: DC | PRN

## 2018-09-15 MED ORDER — ONDANSETRON HCL 4 MG/2ML IJ SOLN
4.0000 mg | Freq: Once | INTRAMUSCULAR | Status: AC
  Administered 2018-09-15: 4 mg via INTRAVENOUS
  Filled 2018-09-15: qty 2

## 2018-09-15 MED ORDER — KETOROLAC TROMETHAMINE 30 MG/ML IJ SOLN
30.0000 mg | Freq: Once | INTRAMUSCULAR | Status: AC
  Administered 2018-09-15: 30 mg via INTRAVENOUS
  Filled 2018-09-15: qty 1

## 2018-09-15 MED ORDER — METOCLOPRAMIDE HCL 5 MG/ML IJ SOLN
10.0000 mg | Freq: Once | INTRAMUSCULAR | Status: AC
  Administered 2018-09-15: 10 mg via INTRAVENOUS
  Filled 2018-09-15: qty 2

## 2018-09-15 NOTE — ED Notes (Signed)
Bed: WA17 Expected date:  Expected time:  Means of arrival:  Comments: 28 yo F/ abd pain

## 2018-09-15 NOTE — Discharge Instructions (Addendum)
Avoid fried foods, fatty foods, greasy foods, and milk products until symptoms resolve. Drink plenty of clear liquids to prevent dehydration. We recommend the use of Phenergan as prescribed for nausea/vomiting. Use 400mg  ibuprofen every 6 hours for pain control. You may supplement this with Bentyl for persistent abdominal cramping, as needed. Take as prescribed. Follow-up with your primary care doctor to ensure resolution of symptoms.

## 2018-09-15 NOTE — ED Notes (Signed)
Patient is not vomiting.

## 2018-09-15 NOTE — ED Triage Notes (Signed)
C/C nausea, vomiting, diarrhea since 1 am. 20 G RAC, 4 of zofran with Ems with no relief.

## 2018-09-15 NOTE — ED Notes (Signed)
Patient was told about policy of little kids. Patient states she has no one to get them.

## 2018-09-15 NOTE — ED Provider Notes (Signed)
[facility]-EMERGENCY DEPT Provider Note   CSN: [id number] Arrival date & time: 09/15/18  [id number]    History   Chief Complaint Chief Complaint  Patient presents with  . Abdominal Pain  . Emesis    HPI Jennifer Peters is a [age] y.o. female.     28 year old female presents to the emergency department for evaluation of nausea, vomiting, diarrhea.  Symptoms began sudden onset at 0100 this morning.  They have remained constant with associated periumbilical abdominal cramping.  Cramping has been constant and waxing and waning in severity without modifying factors.  She was given 4 mg of Zofran by EMS without improvement in nausea.  Had 1 subsequent episode of vomiting upon arrival to the ED.  Had some red hots hot dogs tonight as well as shrimp sushi.  No reported sick contacts or associated fever, urinary symptoms, vaginal complaints, hematemesis.  Abdominal surgical history significant for cesarean section x1 and appendectomy.  The history is provided by the patient. No language interpreter was used.  Abdominal Pain  Associated symptoms: vomiting   Emesis  Associated symptoms: abdominal pain     Past Medical History:  Diagnosis Date  . Abnormal Pap smear of cervix    unknown stage of abnormality  . Anemia   . Hx of chlamydia infection   . Low vitamin D level 05/10/2016  . Post partum depression     Patient Active Problem List   Diagnosis Date Noted  . Encounter for IUD insertion 11/06/2016  . 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 CESAREAN SECTION N/A 09/25/2016   Procedure: CESAREAN SECTION;  Surgeon: Adam PhenixJames G Arnold, MD;  [facility];  Service: Obstetrics;  Laterality: N/A;  . DILATION AND CURETTAGE OF UTERUS    . LAPAROSCOPIC APPENDECTOMY  06/14/2012   Procedure: APPENDECTOMY LAPAROSCOPIC;  Surgeon: Clovis Puhomas A. Cornett, MD;  Location: [facility] OR;  Service: General;  Laterality: N/A;  . wisdom tooth  extracted x1 in 04/2011       OB History    Gravida  4   Para  3   Term  2   Preterm  1   AB  1   Living  2     SAB  1   TAB  0   Ectopic  0   Multiple  0   Live Births  2            Home Medications    Prior to Admission medications   Medication Sig Start Date End Date Taking? Authorizing Provider  amLODipine (NORVASC) 5 MG tablet Take 1 tablet (5 mg total) by mouth daily. 09/28/16   Willodean RosenthalHarraway-Smith, Carolyn, MD  dicyclomine (BENTYL) 20 MG tablet Take 1 tablet (20 mg total) by mouth every 12 (twelve) hours as needed (for abdominal pain/cramping). 09/15/18   Antony MaduraHumes, Asiya Cutbirth, PA-C  ibuprofen (ADVIL,MOTRIN) 800 MG tablet Take 1 tablet (800 mg total) by mouth every 8 (eight) hours as needed. 11/06/16   Roe Coombsenney, Rachelle A, CNM  Prenatal Vit-Fe Fumarate-FA (PRENATAL MULTIVITAMIN) TABS tablet Take 1 tablet by mouth daily at 12 noon.    [provider]  promethazine (PHENERGAN) 25 MG tablet Take 1 tablet (25 mg total) by mouth every 6 (six) hours as needed for up to 7 days for nausea or vomiting. 09/15/18 09/22/18  Antony MaduraHumes, Freada Twersky, PA-C    Family History Family History  Problem Relation Age of Onset  . Kidney disease Maternal Grandmother   .  Heart disease Maternal Grandmother        chf  . Diabetes Maternal Aunt   . Hypertension Mother   . Other Neg Hx     Social History Social History   Tobacco Use  . Smoking status: Former Smoker    Packs/day: 0.50    Years: 3.00    Pack years: 1.50    Last attempt to quit: 08/21/2011    Years since quitting: 7.0  . Smokeless tobacco: Never Used  Substance Use Topics  . Alcohol use: No  . Drug use: No     Allergies   Latex   Review of Systems Review of Systems  Gastrointestinal: Positive for abdominal pain and vomiting.  Ten systems reviewed and are negative for acute change, except as noted in the HPI.    Physical Exam Updated Vital Signs BP 130/82 (BP Location: Right Arm)   Pulse 72   Temp 97.8 F (36.6  C) (Oral)   Resp 20   Ht  (1.575 m)   Wt 57.6 kg   LMP 08/25/2018   SpO2 100%   BMI 23.23 kg/m   Physical Exam Vitals signs and nursing note reviewed.  Constitutional:      General: She is not in acute distress.    Appearance: She is well-developed. She is not diaphoretic.     Comments: Patient appears uncomfortable, nontoxic  HENT:     Head: Normocephalic and atraumatic.  Eyes:     General: No scleral icterus.    Conjunctiva/sclera: Conjunctivae normal.  Neck:     Musculoskeletal: Normal range of motion.  Cardiovascular:     Rate and Rhythm: Normal rate and regular rhythm.     Pulses: Normal pulses.  Pulmonary:     Effort: Pulmonary effort is normal. No respiratory distress.     Breath sounds: No rhonchi.     Comments: Respirations even and unlabored Abdominal:     Comments: Abdomen soft, nondistended.  No focal tenderness.  No peritoneal signs.  Musculoskeletal: Normal range of motion.  Skin:    General: Skin is warm and dry.     Coloration: Skin is not pale.     Findings: No erythema or rash.  Neurological:     General: No focal deficit present.     Mental Status: She is alert and oriented to person, place, and time.     Coordination: Coordination normal.     Comments: GCS 15.  Moving all extremities spontaneously.  Psychiatric:        Behavior: Behavior normal.      ED Treatments / Results  Labs (all labs ordered are listed, but only abnormal results are displayed) Labs Reviewed  CBC WITH DIFFERENTIAL/PLATELET - Abnormal; Notable for the following components:      Result Value   WBC 13.8 (*)    RBC 3.39 (*)    Hemoglobin 11.9 (*)    MCV 108.6 (*)    MCH 35.1 (*)    RDW 11.2 (*)    Neutro Abs 10.2 (*)    All other components within normal limits  COMPREHENSIVE METABOLIC PANEL - Abnormal; Notable for the following components:   Potassium 3.2 (*)    CO2 20 (*)    Glucose, Bld 148 (*)    All other components within normal limits  URINALYSIS,  ROUTINE W REFLEX MICROSCOPIC - Abnormal; Notable for the following components:   APPearance HAZY (*)    Protein, ur 30 (*)    Bacteria, UA RARE (*)  All other components within normal limits  LIPASE, BLOOD  PREGNANCY, URINE    EKG None  Radiology No results found.  Procedures Procedures (including critical care time)  Medications Ordered in ED Medications  metoCLOPramide (REGLAN) injection 10 mg (10 mg Intravenous Given 09/15/18 0352)  sodium chloride 0.9 % bolus 1,000 mL (0 mLs Intravenous Stopped 09/15/18 0505)  ketorolac (TORADOL) 30 MG/ML injection 30 mg (30 mg Intravenous Given 09/15/18 0353)  dicyclomine (BENTYL) injection 20 mg (20 mg Intramuscular Given 09/15/18 0352)  potassium chloride SA (K-DUR,KLOR-CON) CR tablet 40 mEq (40 mEq Oral Given 09/15/18 0501)  ondansetron (ZOFRAN) injection 4 mg (4 mg Intravenous Given 09/15/18 0526)    4:48 AM Patient feeling better following medications.  Denies pain at this time.  No nausea.  Given ice chips for fluid challenge.  Leukocytosis suspected secondary to the stress of vomiting, though patient has historically had a leukocytosis when in the hospital previously; this was in the setting of pregnancy, however.  Anemia is at baseline.  Liver and kidney function preserved.  Lipase normal and UA without evidence of infection.  Pregnancy negative.  Suspect foodborne illness.   Initial Impression / Assessment and Plan / ED Course  I have reviewed the triage vital signs and the nursing notes.  Pertinent labs & imaging results that were available during my care of the patient were reviewed by me and considered in my medical decision making (see chart for details).        Patient with symptoms consistent with gastroenteritis, likely food-borne.  Vitals are stable, no fever.  No signs of dehydration, tolerating PO fluids after antiemetics.  Lungs are clear.  No focal abdominal pain.  Doubt emergent abdominal etiology such as  cholecystitis, pancreatitis, ruptured viscus, UTI, kidney stone, or any other abdominal etiology.  Supportive therapy indicated with return if symptoms worsen.  Return precautions discussed and provided.  Patient discharged in stable condition with no unaddressed concerns.   Final Clinical Impressions(s) / ED Diagnoses   Final diagnoses:  Nausea vomiting and diarrhea  Gastroenteritis    ED Discharge Orders         Ordered    promethazine (PHENERGAN) 25 MG tablet  Every 6 hours PRN     09/15/18 0514    dicyclomine (BENTYL) 20 MG tablet  Every 12 hours PRN     09/15/18 0514           Antony Madura, PA-C 09/15/18 0532    Cathren Laine, MD 09/15/18 [phone]

## 2018-09-17 ENCOUNTER — Emergency Department (HOSPITAL_COMMUNITY)
Admission: EM | Admit: 2018-09-17 | Discharge: 2018-09-17 | Disposition: A | Discharge status: Home / Self Care | Payer: Medicaid Other | Attending: Emergency Medicine | Admitting: Emergency Medicine

## 2018-09-17 ENCOUNTER — Other Ambulatory Visit: Payer: Self-pay

## 2018-09-17 ENCOUNTER — Encounter (HOSPITAL_COMMUNITY): Payer: Self-pay | Admitting: Emergency Medicine

## 2018-09-17 DIAGNOSIS — R197 Diarrhea, unspecified: Secondary | ICD-10-CM | POA: Diagnosis not present

## 2018-09-17 DIAGNOSIS — R112 Nausea with vomiting, unspecified: Secondary | ICD-10-CM | POA: Insufficient documentation

## 2018-09-17 DIAGNOSIS — Z91040 Latex allergy status: Secondary | ICD-10-CM | POA: Diagnosis not present

## 2018-09-17 DIAGNOSIS — Z87891 Personal history of nicotine dependence: Secondary | ICD-10-CM | POA: Diagnosis not present

## 2018-09-17 DIAGNOSIS — Z79899 Other long term (current) drug therapy: Secondary | ICD-10-CM | POA: Insufficient documentation

## 2018-09-17 MED ORDER — SODIUM CHLORIDE 0.9 % IV BOLUS
1000.0000 mL | Freq: Once | INTRAVENOUS | Status: AC
  Administered 2018-09-17: 1000 mL via INTRAVENOUS

## 2018-09-17 MED ORDER — ONDANSETRON HCL 4 MG/2ML IJ SOLN
4.0000 mg | Freq: Once | INTRAMUSCULAR | Status: AC
  Administered 2018-09-17: 4 mg via INTRAVENOUS
  Filled 2018-09-17: qty 2

## 2018-09-17 MED ORDER — MORPHINE SULFATE (PF) 2 MG/ML IV SOLN
1.0000 mg | Freq: Once | INTRAVENOUS | Status: AC
  Administered 2018-09-17: 1 mg via INTRAVENOUS
  Filled 2018-09-17: qty 1

## 2018-09-17 MED ORDER — ONDANSETRON 4 MG PO TBDP: 4.0000 mg | ORAL_TABLET | Freq: Three times a day (TID) | ORAL | 0 refills | Status: DC | PRN

## 2018-09-17 NOTE — ED Notes (Signed)
Pt tolerated 4 oz of water without nausea

## 2018-09-17 NOTE — ED Notes (Addendum)
Starting PO challenge with water  

## 2018-09-17 NOTE — ED Triage Notes (Signed)
Was seen here on Monday for N/V/D, said she has not gotten any better. Patient complains of N/V, dry heaves with EMS.

## 2018-09-17 NOTE — Discharge Instructions (Signed)
Please read instructions below. °Drink clear liquids until your stomach feels better. Then, slowly introduce bland foods into your diet as tolerated, such as bread, rice, apples, bananas. °You can take zofran every 8 hours as needed for nausea. °Follow up with your primary care if symptoms persist. °Return to the ER for severe abdominal pain, fever, uncontrollable vomiting, or new or concerning symptoms. ° °

## 2018-09-17 NOTE — ED Provider Notes (Signed)
[facility]-EMERGENCY DEPT Provider Note   CSN: [id number] Arrival date & time: 09/17/18  0039    History   Chief Complaint Chief Complaint  Patient presents with  . Emesis    HPI Jennifer Peters is a 28 y.o. female presenting to the emergency department with complaint of persistent nausea, vomiting, and diarrhea that began 2 days ago.  Patient was evaluated in this ED on 09/15/2018 for the same complaint.  She was thought to have gastroenteritis, suspected to be food-borne etiology.  Labs during that visit revealed a slight leukocytosis, however was noted to have historically elevated white count was felt to be stress response in the setting of persistent vomiting.  CMP was within normal limits.  Urine and pregnant negative.  Patient states the Phenergan she was prescribed has provided some relief of her symptoms, however it does not provide consistent relief.  She has also been taking the Bentyl which has been helping her upper abdominal stomach cramping.  She presents today for persistence of symptoms.  She has nonbloody nonbilious emesis with watery diarrhea.  She has intermittent episodes of upper abdominal cramping.  She denies fever, urinary symptoms, pelvic complaints.  Past abdominal surgeries include appendectomy and cesarean section.  Reports occasional marijuana and alcohol use, however none recent.     The history is provided by the patient and medical records.    Past Medical History:  Diagnosis Date  . Abnormal Pap smear of cervix    unknown stage of abnormality  . Anemia   . Hx of chlamydia infection   . Low vitamin D level 05/10/2016  . Post partum depression     Patient Active Problem List   Diagnosis Date Noted  . Encounter for IUD insertion 11/06/2016  . 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 CESAREAN SECTION N/A 09/25/2016   Procedure: CESAREAN SECTION;  Surgeon: Adam PhenixJames G Arnold, MD;   [facility];  Service: Obstetrics;  Laterality: N/A;  . DILATION AND CURETTAGE OF UTERUS    . LAPAROSCOPIC APPENDECTOMY  06/14/2012   Procedure: APPENDECTOMY LAPAROSCOPIC;  Surgeon: Clovis Puhomas A. Cornett, MD;  Location: [facility] OR;  Service: General;  Laterality: N/A;  . wisdom tooth extracted x1 in 04/2011       OB History    Gravida  4   Para  3   Term  2   Preterm  1   AB  1   Living  2     SAB  1   TAB  0   Ectopic  0   Multiple  0   Live Births  2            Home Medications    Prior to Admission medications   Medication Sig Start Date End Date Taking? Authorizing Provider  dicyclomine (BENTYL) 20 MG tablet Take 1 tablet (20 mg total) by mouth every 12 (twelve) hours as needed (for abdominal pain/cramping). 09/15/18  Yes Antony MaduraHumes, Kelly, PA-C  Multiple Vitamins-Minerals (MULTIVITAMIN WOMEN) TABS Take 1 tablet by mouth daily.   Yes [provider]  naproxen sodium (ALEVE) 220 MG tablet Take 220 mg by mouth daily as needed (pain).   Yes [provider]  promethazine (PHENERGAN) 25 MG tablet Take 1 tablet (25 mg total) by mouth every 6 (six) hours as needed for up to 7 days for nausea or vomiting. 09/15/18 09/22/18 Yes Antony MaduraHumes, Kelly, PA-C  amLODipine (NORVASC) 5 MG tablet Take 1  tablet (5 mg total) by mouth daily. Patient not taking: Reported on 09/17/2018 09/28/16   Willodean Rosenthal, MD  ibuprofen (ADVIL,MOTRIN) 800 MG tablet Take 1 tablet (800 mg total) by mouth every 8 (eight) hours as needed. Patient not taking: Reported on 09/17/2018 11/06/16   Orvilla Cornwall A, CNM  ondansetron (ZOFRAN ODT) 4 MG disintegrating tablet Take 1 tablet (4 mg total) by mouth every 8 (eight) hours as needed for nausea or vomiting. 09/17/18   Robinson, [place] N, PA-C    Family History Family History  Problem Relation Age of Onset  . Kidney disease Maternal Grandmother   . Heart disease Maternal Grandmother        chf  . Diabetes Maternal Aunt   .  Hypertension Mother   . Other Neg Hx     Social History Social History   Tobacco Use  . Smoking status: Former Smoker    Packs/day: 0.50    Years: 3.00    Pack years: 1.50    Last attempt to quit: 08/21/2011    Years since quitting: 7.0  . Smokeless tobacco: Never Used  Substance Use Topics  . Alcohol use: No  . Drug use: No     Allergies   Latex   Review of Systems Review of Systems  Constitutional: Negative for fever.  Gastrointestinal: Positive for abdominal pain, diarrhea, nausea and vomiting.  Genitourinary: Negative for dysuria, frequency, vaginal bleeding and vaginal discharge.  All other systems reviewed and are negative.    Physical Exam Updated Vital Signs BP 103/71   Pulse 72   Temp 98.3 F (36.8 C) (Oral)   Resp 17   Ht  (1.575 m)   Wt 57.6 kg   LMP 08/24/2018   SpO2 100%   BMI 23.23 kg/m   Physical Exam Vitals signs and nursing note reviewed.  Constitutional:      Appearance: She is well-developed. She is not ill-appearing.  HENT:     Head: Normocephalic and atraumatic.     Mouth/Throat:     Mouth: Mucous membranes are moist.  Eyes:     Conjunctiva/sclera: Conjunctivae normal.  Cardiovascular:     Rate and Rhythm: Normal rate and regular rhythm.     Pulses: Normal pulses.     Heart sounds: Normal heart sounds.  Pulmonary:     Effort: Pulmonary effort is normal. No respiratory distress.     Breath sounds: Normal breath sounds.  Abdominal:     General: Abdomen is flat. Bowel sounds are normal. There is no distension.     Palpations: Abdomen is soft.     Tenderness: There is abdominal tenderness in the epigastric area and left upper quadrant. There is no guarding or rebound. Negative signs include Murphy's sign.     Hernia: No hernia is present.  Skin:    General: Skin is warm.  Neurological:     Mental Status: She is alert.  Psychiatric:        Behavior: Behavior normal.      ED Treatments / Results  Labs (all labs  ordered are listed, but only abnormal results are displayed) Labs Reviewed - No data to display  EKG None  Radiology No results found.  Procedures Procedures (including critical care time)  Medications Ordered in ED Medications  sodium chloride 0.9 % bolus 1,000 mL (1,000 mLs Intravenous New Bag/Given 09/17/18 0205)  ondansetron (ZOFRAN) injection 4 mg (4 mg Intravenous Given 09/17/18 0156)  morphine 2 MG/ML injection 1 mg (1 mg Intravenous  Given 09/17/18 0156)     Initial Impression / Assessment and Plan / ED Course  I have reviewed the triage vital signs and the nursing notes.  Pertinent labs & imaging results that were available during my care of the patient were reviewed by me and considered in my medical decision making (see chart for details).  Clinical Course as of Sep 17 350  Wed Sep 17, 2018  0259 Patient reevaluated.  Reports improvement in nausea.  Will finish IV fluids and p.o. challenge.   [JR]  [id number] Patient p.o. challenged and reports she feels well and is ready for discharge.  Patient keeping fluids down.   [JR]    Clinical Course User Index [JR] Robinson, [place] N, PA-C      Patient with symptoms consistent with gastroenteritis, possibly viral vs food-borne.  Vitals are stable, no fever.  No signs of dehydration.  Labs drawn during recent visit 2 days ago.  Do not believe redraw is necessary at this time.  Abdominal exam with very mild upper abdominal tenderness, no guarding or rebound.  Lungs are clear.   Patient treated with antiemetics and fluids, p.o. challenged and tolerating fluids > 6 oz.  No cholecystitis, pancreatitis, ruptured viscus, UTI, kidney stone, or any other abdominal etiology.  Supportive therapy indicated with return if symptoms worsen.  Patient counseled.  Discussed results, findings, treatment and follow up. Patient advised of return precautions. Patient verbalized understanding and agreed with plan.  Final Clinical Impressions(s) / ED  Diagnoses   Final diagnoses:  Nausea vomiting and diarrhea    ED Discharge Orders         Ordered    ondansetron (ZOFRAN ODT) 4 MG disintegrating tablet  Every 8 hours PRN     09/17/18 0344           Robinson, [place] N, PA-C 09/17/18 0353    Dione Booze, MD 09/17/18 [phone]

## 2018-12-13 ENCOUNTER — Emergency Department (HOSPITAL_COMMUNITY)
Admission: EM | Admit: 2018-12-13 | Discharge: 2018-12-14 | Disposition: A | Discharge status: Home / Self Care | Payer: Medicaid Other | Attending: Emergency Medicine | Admitting: Emergency Medicine

## 2018-12-13 ENCOUNTER — Encounter (HOSPITAL_COMMUNITY): Payer: Self-pay

## 2018-12-13 ENCOUNTER — Other Ambulatory Visit: Payer: Self-pay

## 2018-12-13 DIAGNOSIS — Z87891 Personal history of nicotine dependence: Secondary | ICD-10-CM | POA: Diagnosis not present

## 2018-12-13 DIAGNOSIS — R112 Nausea with vomiting, unspecified: Secondary | ICD-10-CM | POA: Diagnosis present

## 2018-12-13 DIAGNOSIS — R1013 Epigastric pain: Secondary | ICD-10-CM | POA: Diagnosis not present

## 2018-12-13 DIAGNOSIS — R42 Dizziness and giddiness: Secondary | ICD-10-CM | POA: Diagnosis not present

## 2018-12-13 DIAGNOSIS — Z79899 Other long term (current) drug therapy: Secondary | ICD-10-CM | POA: Insufficient documentation

## 2018-12-13 DIAGNOSIS — Z91040 Latex allergy status: Secondary | ICD-10-CM | POA: Diagnosis not present

## 2018-12-13 LAB — I-STAT BETA HCG BLOOD, ED (MC, WL, AP ONLY): I-stat hCG, quantitative: <5 m[IU]/mL (ref <5)

## 2018-12-13 MED ORDER — SODIUM CHLORIDE 0.9% FLUSH: 3.0000 mL | Freq: Once | INTRAVENOUS | Status: DC

## 2018-12-13 NOTE — ED Triage Notes (Signed)
Pt reports that she has been nauseous since 7a this morning. She reports that she vomits everything that she tries to drink. She states that she is really thirsty. She tried zofran at home without relief. Denies diarrhea or fever.

## 2018-12-14 ENCOUNTER — Emergency Department (HOSPITAL_COMMUNITY): Payer: Medicaid Other

## 2018-12-14 LAB — COMPREHENSIVE METABOLIC PANEL
ALT: 26 U/L (ref 0–44)
AST: 27 U/L (ref 15–41)
Albumin: 4.9 g/dL (ref 3.5–5.0)
Alkaline Phosphatase: 40 U/L (ref 38–126)
Anion gap: 13 (ref 5–15)
BUN: 17 mg/dL (ref 6–20)
CO2: 22 mmol/L (ref 22–32)
Calcium: 9.4 mg/dL (ref 8.9–10.3)
Chloride: 105 mmol/L (ref 98–111)
Creatinine, Ser: 0.68 mg/dL (ref 0.44–1.00)
GFR calc Af Amer: >60 mL/min (ref >60)
GFR calc non Af Amer: >60 mL/min (ref >60)
Glucose, Bld: 125 mg/dL — ABNORMAL HIGH (ref 70–99)
Potassium: 3.5 mmol/L (ref 3.5–5.1)
Sodium: 140 mmol/L (ref 135–145)
Total Bilirubin: 0.7 mg/dL (ref 0.3–1.2)
Total Protein: 7.8 g/dL (ref 6.5–8.1)

## 2018-12-14 LAB — CBC
HCT: 33.8 % — ABNORMAL LOW (ref 36.0–46.0)
Hemoglobin: 11.4 g/dL — ABNORMAL LOW (ref 12.0–15.0)
MCH: 35.7 pg — ABNORMAL HIGH (ref 26.0–34.0)
MCHC: 33.7 g/dL (ref 30.0–36.0)
MCV: 106 fL — ABNORMAL HIGH (ref 80.0–100.0)
Platelets: 271 10*3/uL (ref 150–400)
RBC: 3.19 MIL/uL — ABNORMAL LOW (ref 3.87–5.11)
RDW: 11.3 % — ABNORMAL LOW (ref 11.5–15.5)
WBC: 11.5 10*3/uL — ABNORMAL HIGH (ref 4.0–10.5)
nRBC: 0 % (ref 0.0–0.2)

## 2018-12-14 LAB — URINALYSIS, ROUTINE W REFLEX MICROSCOPIC
Bilirubin Urine: NEGATIVE
Glucose, UA: NEGATIVE mg/dL
Hgb urine dipstick: NEGATIVE
Ketones, ur: 20 mg/dL — AB
Nitrite: NEGATIVE
Protein, ur: NEGATIVE mg/dL
Specific Gravity, Urine: 1.018 (ref 1.005–1.030)
pH: 6 (ref 5.0–8.0)

## 2018-12-14 LAB — RAPID URINE DRUG SCREEN, HOSP PERFORMED
Amphetamines: NOT DETECTED
Barbiturates: NOT DETECTED
Benzodiazepines: NOT DETECTED
Cocaine: NOT DETECTED
Opiates: NOT DETECTED
Tetrahydrocannabinol: POSITIVE — AB

## 2018-12-14 LAB — LIPASE, BLOOD: Lipase: 25 U/L (ref 11–51)

## 2018-12-14 MED ORDER — PROMETHAZINE HCL 25 MG/ML IJ SOLN
12.5000 mg | Freq: Once | INTRAMUSCULAR | Status: AC
  Administered 2018-12-14: 12.5 mg via INTRAVENOUS
  Filled 2018-12-14: qty 1

## 2018-12-14 MED ORDER — FAMOTIDINE IN NACL 20-0.9 MG/50ML-% IV SOLN
20.0000 mg | Freq: Once | INTRAVENOUS | Status: AC
  Administered 2018-12-14: 20 mg via INTRAVENOUS
  Filled 2018-12-14: qty 50

## 2018-12-14 MED ORDER — SODIUM CHLORIDE 0.9 % IV BOLUS
1000.0000 mL | Freq: Once | INTRAVENOUS | Status: AC
  Administered 2018-12-14: 1000 mL via INTRAVENOUS

## 2018-12-14 MED ORDER — ONDANSETRON HCL 4 MG/2ML IJ SOLN
4.0000 mg | Freq: Once | INTRAMUSCULAR | Status: AC
  Administered 2018-12-14: 4 mg via INTRAVENOUS
  Filled 2018-12-14: qty 2

## 2018-12-14 MED ORDER — LIDOCAINE VISCOUS HCL 2 % MT SOLN
15.0000 mL | Freq: Once | OROMUCOSAL | Status: AC
  Administered 2018-12-14: 15 mL via ORAL
  Filled 2018-12-14: qty 15

## 2018-12-14 MED ORDER — PROMETHAZINE HCL 25 MG RE SUPP: 25.0000 mg | Freq: Four times a day (QID) | RECTAL | 0 refills | Status: DC | PRN

## 2018-12-14 MED ORDER — PROMETHAZINE HCL 25 MG PO TABS: 25.0000 mg | ORAL_TABLET | Freq: Four times a day (QID) | ORAL | 0 refills | Status: DC | PRN

## 2018-12-14 MED ORDER — ALUM & MAG HYDROXIDE-SIMETH 200-200-20 MG/5ML PO SUSP
30.0000 mL | Freq: Once | ORAL | Status: AC
  Administered 2018-12-14: 30 mL via ORAL
  Filled 2018-12-14: qty 30

## 2018-12-14 NOTE — ED Notes (Signed)
Pt reports episode of emesis

## 2018-12-14 NOTE — ED Notes (Signed)
Pt verbalized discharge instructions and follow up care. Alert and ambulatory. No IV. Boyfriend is picking pt up

## 2018-12-14 NOTE — ED Notes (Signed)
Pt ambulated to the bathroom without assistance. Gait steady  

## 2018-12-14 NOTE — ED Notes (Signed)
Pt aware that urine sample is needed.  

## 2018-12-14 NOTE — ED Notes (Signed)
Patient transported to X-ray 

## 2018-12-14 NOTE — ED Notes (Signed)
Pt able to tolerate fluids now with no reports of emesis

## 2018-12-14 NOTE — ED Notes (Signed)
Pt given 4 oz of water. States she feels nauseous but no reports of emesis yet. Will continue to monitor

## 2018-12-14 NOTE — Discharge Instructions (Signed)
Take the nausea medication as prescribed and stay away from marijuana.  Follow-up with your primary doctor.  Keep yourself hydrated.  Return to the ED if you have new or worsening symptoms.

## 2018-12-14 NOTE — ED Notes (Signed)
Urine and culture sent to lab  

## 2018-12-14 NOTE — ED Provider Notes (Signed)
[facility]-EMERGENCY DEPT Provider Note   CSN: [id number] Arrival date & time: 12/13/18  2307     History   Chief Complaint Chief Complaint  Patient presents with  . Emesis    HPI Jennifer Peters is a 28 y.o. female.     Patient states nausea, dizziness and nausea vomiting ongoing since 7 AM yesterday.  She reports feeling episodes to count.  She has epigastric abdominal pain after vomiting.  Emesis has been clear and nonbloody.  She is dehydrated diarrhea or fever.  She denies any pain with urination or blood in the urine.  No vaginal bleeding or discharge.  Not really that she is pregnant.  Her last menstrual cycle was the end of May but she does have an IUD.  She denies any sick contacts or recent travel.  No history of ongoing stomach issues.  No history of ulcers or acid reflux.  Previous appendectomy.  Still has gallbladder.  The history is provided by the patient.  Emesis Associated symptoms: abdominal pain   Associated symptoms: no arthralgias, no diarrhea, no fever, no headaches and no myalgias     Past Medical History:  Diagnosis Date  . Abnormal Pap smear of cervix    unknown stage of abnormality  . Anemia   . Hx of chlamydia infection   . Low vitamin D level 05/10/2016  . Post partum depression     Patient Active Problem List   Diagnosis Date Noted  . Encounter for IUD insertion 11/06/2016  . 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 CESAREAN SECTION N/A 09/25/2016   Procedure: CESAREAN SECTION;  Surgeon: Adam PhenixJames G Arnold, MD;  [facility];  Service: Obstetrics;  Laterality: N/A;  . DILATION AND CURETTAGE OF UTERUS    . LAPAROSCOPIC APPENDECTOMY  06/14/2012   Procedure: APPENDECTOMY LAPAROSCOPIC;  Surgeon: Clovis Puhomas A. Cornett, MD;  Location: [facility] OR;  Service: General;  Laterality: N/A;  . wisdom tooth extracted x1 in 04/2011       OB History    Gravida  4   Para  3   Term   2   Preterm  1   AB  1   Living  2     SAB  1   TAB  0   Ectopic  0   Multiple  0   Live Births  2            Home Medications    Prior to Admission medications   Medication Sig Start Date End Date Taking? Authorizing Provider  amLODipine (NORVASC) 5 MG tablet Take 1 tablet (5 mg total) by mouth daily. Patient not taking: Reported on 09/17/2018 09/28/16   Willodean RosenthalHarraway-Smith, Carolyn, MD  dicyclomine (BENTYL) 20 MG tablet Take 1 tablet (20 mg total) by mouth every 12 (twelve) hours as needed (for abdominal pain/cramping). 09/15/18   Antony MaduraHumes, Kelly, PA-C  ibuprofen (ADVIL,MOTRIN) 800 MG tablet Take 1 tablet (800 mg total) by mouth every 8 (eight) hours as needed. Patient not taking: Reported on 09/17/2018 11/06/16   Orvilla Cornwallenney, Rachelle A, CNM  Multiple Vitamins-Minerals (MULTIVITAMIN WOMEN) TABS Take 1 tablet by mouth daily.    [provider]  naproxen sodium (ALEVE) 220 MG tablet Take 220 mg by mouth daily as needed (pain).    [provider]  ondansetron (ZOFRAN ODT) 4 MG disintegrating tablet Take 1 tablet (4 mg total) by mouth every 8 (eight) hours as needed for nausea or  vomiting. 09/17/18   Robinson, [place] N, PA-C  promethazine (PHENERGAN) 25 MG tablet Take 1 tablet (25 mg total) by mouth every 6 (six) hours as needed for up to 7 days for nausea or vomiting. 09/15/18 09/22/18  Antony MaduraHumes, Kelly, PA-C    Family History Family History  Problem Relation Age of Onset  . Kidney disease Maternal Grandmother   . Heart disease Maternal Grandmother        chf  . Diabetes Maternal Aunt   . Hypertension Mother   . Other Neg Hx     Social History Social History   Tobacco Use  . Smoking status: Former Smoker    Packs/day: 0.50    Years: 3.00    Pack years: 1.50    Quit date: 08/21/2011    Years since quitting: 7.3  . Smokeless tobacco: Never Used  Substance Use Topics  . Alcohol use: No  . Drug use: No     Allergies   Latex   Review of Systems Review of  Systems  Constitutional: Positive for activity change and appetite change. Negative for fever.  HENT: Negative for congestion and rhinorrhea.   Respiratory: Negative for chest tightness.   Gastrointestinal: Positive for abdominal pain, nausea and vomiting. Negative for diarrhea.  Genitourinary: Negative for dysuria and hematuria.  Musculoskeletal: Negative for arthralgias and myalgias.  Skin: Negative for wound.  Neurological: Positive for dizziness, weakness and light-headedness. Negative for headaches.    all other systems are negative except as noted in the HPI and PMH.    Physical Exam Updated Vital Signs BP (!) 128/97 (BP Location: Right Arm)   Pulse (!) 59   Temp 98.4 F (36.9 C) (Oral)   Resp 16   Ht 5\' 2"  (1.575 m)   Wt 56.4 kg   SpO2 100%   BMI 22.75 kg/m   Physical Exam Vitals signs and nursing note reviewed.  Constitutional:      General: She is not in acute distress.    Appearance: Normal appearance. She is well-developed and normal weight. She is not ill-appearing.  HENT:     Head: Normocephalic and atraumatic.     Mouth/Throat:     Mouth: Mucous membranes are moist.     Pharynx: No oropharyngeal exudate.  Eyes:     Conjunctiva/sclera: Conjunctivae normal.     Pupils: Pupils are equal, round, and reactive to light.  Neck:     Musculoskeletal: Normal range of motion and neck supple.     Comments: No meningismus. Cardiovascular:     Rate and Rhythm: Normal rate and regular rhythm.     Heart sounds: Normal heart sounds. No murmur.  Pulmonary:     Effort: Pulmonary effort is normal. No respiratory distress.     Breath sounds: Normal breath sounds.  Abdominal:     Palpations: Abdomen is soft.     Tenderness: There is abdominal tenderness. There is no guarding or rebound.     Comments: Epigastric tenderness with voluntary guarding, no right upper quadrant tenderness, no lower abdominal tenderness  Musculoskeletal: Normal range of motion.        General: No  tenderness.     Comments: No CVAT  Skin:    General: Skin is warm.     Capillary Refill: Capillary refill takes less than 2 seconds.  Neurological:     General: No focal deficit present.     Mental Status: She is alert and oriented to person, place, and time. Mental status is at baseline.  Cranial Nerves: No cranial nerve deficit.     Motor: No abnormal muscle tone.     Coordination: Coordination normal.     Comments:  5/5 strength throughout. CN 2-12 intact.Equal grip strength.   Psychiatric:        Behavior: Behavior normal.      ED Treatments / Results  Labs (all labs ordered are listed, but only abnormal results are displayed) Labs Reviewed  COMPREHENSIVE METABOLIC PANEL - Abnormal; Notable for the following components:      Result Value   Glucose, Bld 125 (*)    All other components within normal limits  CBC - Abnormal; Notable for the following components:   WBC 11.5 (*)    RBC 3.19 (*)    Hemoglobin 11.4 (*)    HCT 33.8 (*)    MCV 106.0 (*)    MCH 35.7 (*)    RDW 11.3 (*)    All other components within normal limits  LIPASE, BLOOD  URINALYSIS, ROUTINE W REFLEX MICROSCOPIC  URINALYSIS, ROUTINE W REFLEX MICROSCOPIC  I-STAT BETA HCG BLOOD, ED ([facility], [facility], [facility] ONLY)    EKG None  Radiology Dg Abdomen Acute W/chest  Result Date: 12/14/2018 CLINICAL DATA:  Epigastric abdominal pain EXAM: DG ABDOMEN ACUTE W/ 1V CHEST COMPARISON:  Chest radiographs dated 02/18/2015. CT abdomen/pelvis dated 06/14/2012. FINDINGS: Lungs are clear.  No pleural effusion or pneumothorax. The heart is normal in size. Nonobstructive bowel gas pattern. No evidence of free air under the diaphragm on the upright view. IUD overlying the pelvis. Visualized osseous structures are within normal limits. IMPRESSION: No evidence of acute cardiopulmonary disease. No evidence of small bowel obstruction or free air. Electronically Signed   By: Julian Hy M.D.   On: 12/14/2018 00:54    Procedures  Procedures (including critical care time)  Medications Ordered in ED Medications  sodium chloride flush (NS) 0.9 % injection 3 mL (has no administration in time range)  sodium chloride 0.9 % bolus 1,000 mL (has no administration in time range)  ondansetron (ZOFRAN) injection 4 mg (has no administration in time range)  sodium chloride 0.9 % bolus 1,000 mL (has no administration in time range)     Initial Impression / Assessment and Plan / ED Course  I have reviewed the triage vital signs and the nursing notes.  Pertinent labs & imaging results that were available during my care of the patient were reviewed by me and considered in my medical decision making (see chart for details).       Epigastric pain with nausea and vomiting.  Abdomen soft without peritoneal signs.  Patient hydrated given symptom control.  hCG is negative Labs reassuring with normal LFTs and lipase.  Patient hydrated and given symptom control.  UA is negative.  UDS is positive for marijuana.  Acute abdominal series is negative.  On recheck, abdomen is soft and nontender.  Patient able to tolerate p.o.  No right upper quadrant pain.  Low suspicion for gallbladder pathology.  Discussed with patient possibility of marijuana contributing to her symptoms.  She denies using it but states she "smells that around others'. Advised her to stay away from this.  We will treat her symptomatically and have her follow-up with her PCP.  Return precautions discussed  Final Clinical Impressions(s) / ED Diagnoses   Final diagnoses:  Non-intractable vomiting with nausea, unspecified vomiting type    ED Discharge Orders    None       Ashana Tullo, Annie Main, MD 12/14/18 [phone]

## 2019-03-19 ENCOUNTER — Telehealth: Payer: Self-pay

## 2019-03-19 NOTE — Telephone Encounter (Signed)
Patient called stating that her period has stopped since getting her IUD two years ago.  Asked patient if she has had her strings  Checked recently or a heavy amount of bleeding and she states that she did have a pelvic exam a few months back and they told her the strings were in place.   Instructed her to take a pregnancy test at home for reassurance but sometimes with the IUD it can be in place and your periods stop.  Patient offered appointment for string check and declined. Kathrene Alu RN

## 2019-11-06 ENCOUNTER — Other Ambulatory Visit: Payer: Self-pay

## 2019-11-06 ENCOUNTER — Ambulatory Visit (INDEPENDENT_AMBULATORY_CARE_PROVIDER_SITE_OTHER): Payer: Medicaid Other | Admitting: Registered Nurse

## 2019-11-06 ENCOUNTER — Encounter: Payer: Self-pay | Admitting: Registered Nurse

## 2019-11-06 VITALS — BP 109/76 | HR 82 | Temp 97.9°F | Resp 14 | Ht 62.0 in | Wt 137.2 lb

## 2019-11-06 DIAGNOSIS — Z0283 Encounter for blood-alcohol and blood-drug test: Secondary | ICD-10-CM | POA: Diagnosis not present

## 2019-11-06 DIAGNOSIS — Z13228 Encounter for screening for other metabolic disorders: Secondary | ICD-10-CM

## 2019-11-06 DIAGNOSIS — Z130 Encounter for screening for diseases of the blood and blood-forming organs and certain disorders involving the immune mechanism: Secondary | ICD-10-CM

## 2019-11-06 DIAGNOSIS — D531 Other megaloblastic anemias, not elsewhere classified: Secondary | ICD-10-CM | POA: Diagnosis not present

## 2019-11-06 DIAGNOSIS — Z1329 Encounter for screening for other suspected endocrine disorder: Secondary | ICD-10-CM | POA: Diagnosis not present

## 2019-11-06 DIAGNOSIS — E559 Vitamin D deficiency, unspecified: Secondary | ICD-10-CM

## 2019-11-06 NOTE — Progress Notes (Signed)
New Patient Office Visit  Subjective:  Patient ID: Jennifer Peters, female    DOB: [birth year]  Age: 29 y.o. MRN: [id number]  CC:  Chief Complaint  Patient presents with   Establish Care    pt has no concerns pt just needs a drug test for her custody case, pt also would like referral to GYN here in [place] so it is closer for her.    HPI Jennifer Peters presents for visit to establish care.  Needs urine drug screen for a court case she is involved in. Denies using substances or history of positive urine drug screen. Has been tested in the past for employment.  History of abnormal pap. Requesting referral to Gyn group in [place] so she can have someone closer to her current home to follow up on this with  History of megaloblastic anemia and vit d deficiency - will draw labs today to follow up on these.  Otherwise, no concerns. Histories reviewed.   Past Medical History:  Diagnosis Date   Abnormal Pap smear of cervix    unknown stage of abnormality   Anemia    Hx of chlamydia infection    Low vitamin D level 05/10/2016   Post partum depression     Past Surgical History:  Procedure Laterality Date   CESAREAN SECTION N/A 09/25/2016   Procedure: CESAREAN SECTION;  Surgeon: Adam Phenix, MD;  Location: [facility] BIRTHING SUITES;  Service: Obstetrics;  Laterality: N/A;   DILATION AND CURETTAGE OF UTERUS     LAPAROSCOPIC APPENDECTOMY  06/14/2012   Procedure: APPENDECTOMY LAPAROSCOPIC;  Surgeon: Clovis Pu. Cornett, MD;  Location: [facility] OR;  Service: General;  Laterality: N/A;   wisdom tooth extracted x1 in 04/2011      Family History  Problem Relation Age of Onset   Kidney disease Maternal Grandmother    Heart disease Maternal Grandmother        chf   Diabetes Maternal Aunt    Hypertension Mother    Hypertension Father    Other Neg Hx     Social History   Socioeconomic History   Marital status: Single    Spouse name: [name]   Number of children: 3     Years of education: 12   Highest education level: Not on file  Occupational History   Occupation: Conservation officer, nature    Comment: [employer]   Occupation: FOB    Comment: unemployed  Tobacco Use   Smoking status: Former Smoker    Packs/day: 0.50    Years: 3.00    Pack years: 1.50    Quit date: 08/21/2011    Years since quitting: 8.2   Smokeless tobacco: Never Used  Substance and Sexual Activity   Alcohol use: No   Drug use: No   Sexual activity: Yes    Birth control/protection: Condom  Other Topics Concern   Not on file  Social History Narrative   Pt is single. FOB is age 63.    Social Determinants of Health   Financial Resource Strain:    Difficulty of Paying Living Expenses:   Food Insecurity:    Worried About Programme researcher, broadcasting/film/video in the Last Year:    Barista in the Last Year:   Transportation Needs:    Freight forwarder (Medical):    Lack of Transportation (Non-Medical):   Physical Activity:    Days of Exercise per Week:    Minutes of Exercise per Session:   Stress:  Feeling of Stress :   Social Connections:    Frequency of Communication with Friends and Family:    Frequency of Social Gatherings with Friends and Family:    Attends Religious Services:    Active Member of Clubs or Organizations:    Attends Engineer, structural:    Marital Status:   Intimate Partner Violence:    Fear of Current or Ex-Partner:    Emotionally Abused:    Physically Abused:    Sexually Abused:     ROS Review of Systems  Constitutional: Negative.   HENT: Negative.   Eyes: Negative.   Respiratory: Negative.   Cardiovascular: Negative.   Gastrointestinal: Negative.   Endocrine: Negative.   Genitourinary: Negative.   Musculoskeletal: Negative.   Skin: Negative.   Allergic/Immunologic: Negative.   Neurological: Negative.   Hematological: Negative.   Psychiatric/Behavioral: Negative.   All other systems reviewed and are  negative.   Objective:   Today's Vitals: BP 109/76    Pulse 82    Temp 97.9 F (36.6 C) (Temporal)    Resp 14    Ht 5\' 2"  (1.575 m)    Wt 137 lb 3.2 oz (62.2 kg)    SpO2 99%    BMI 25.09 kg/m   Physical Exam Vitals and nursing note reviewed.  Constitutional:      Appearance: Normal appearance. She is normal weight.  Cardiovascular:     Rate and Rhythm: Normal rate and regular rhythm.  Pulmonary:     Effort: Pulmonary effort is normal. No respiratory distress.  Neurological:     General: No focal deficit present.     Mental Status: She is alert and oriented to person, place, and time. Mental status is at baseline.  Psychiatric:        Mood and Affect: Mood normal.        Behavior: Behavior normal.        Thought Content: Thought content normal.        Judgment: Judgment normal.     Assessment & Plan:   Problem List Items Addressed This Visit      Other   Megaloblastic anemia   Relevant Orders   CBC With Differential    Other Visit Diagnoses    Encounter for drug screening    -  Primary   Relevant Orders   ToxASSURE Select 13 (MW), Urine   Vitamin D deficiency       Relevant Orders   Vitamin D, 25-hydroxy   Screening for endocrine, metabolic and immunity disorder       Relevant Orders   Comprehensive metabolic panel   Lipid panel   TSH      Outpatient Encounter Medications as of 11/06/2019  Medication Sig   [DISCONTINUED] acetaminophen (TYLENOL) 500 MG tablet Take 1,000 mg by mouth every 6 (six) hours as needed for mild pain, moderate pain or headache.   [DISCONTINUED] amLODipine (NORVASC) 5 MG tablet Take 1 tablet (5 mg total) by mouth daily. (Patient not taking: Reported on 09/17/2018)   [DISCONTINUED] dicyclomine (BENTYL) 20 MG tablet Take 1 tablet (20 mg total) by mouth every 12 (twelve) hours as needed (for abdominal pain/cramping). (Patient not taking: Reported on 12/14/2018)   [DISCONTINUED] Multiple Vitamins-Minerals (MULTIVITAMIN WOMEN) TABS Take 1  tablet by mouth daily.   [DISCONTINUED] ondansetron (ZOFRAN ODT) 4 MG disintegrating tablet Take 1 tablet (4 mg total) by mouth every 8 (eight) hours as needed for nausea or vomiting. (Patient not taking: Reported on 12/14/2018)   [  DISCONTINUED] promethazine (PHENERGAN) 25 MG suppository Place 1 suppository (25 mg total) rectally every 6 (six) hours as needed for nausea or vomiting.   [DISCONTINUED] promethazine (PHENERGAN) 25 MG tablet Take 1 tablet (25 mg total) by mouth every 6 (six) hours as needed for nausea or vomiting.   No facility-administered encounter medications on file as of 11/06/2019.    Follow-up: No follow-ups on file.   PLAN  Labs collected, will follow up as warranted  Referral placed per pt request  Return for CPE  Patient encouraged to call clinic with any questions, comments, or concerns.  Maximiano Coss, NP

## 2019-11-06 NOTE — Patient Instructions (Signed)
° ° ° °  If you have lab work done today you will be contacted with your lab results within the next 2 weeks.  If you have not heard from us then please contact us. The fastest way to get your results is to register for My Chart. ° ° °IF you received an x-ray today, you will receive an invoice from [facility]. Please contact [facility] at [phone] with questions or concerns regarding your invoice.  ° °IF you received labwork today, you will receive an invoice from [facility]. Please contact [facility] at [phone] with questions or concerns regarding your invoice.  ° °Our billing staff will not be able to assist you with questions regarding bills from these companies. ° °You will be contacted with the lab results as soon as they are available. The fastest way to get your results is to activate your My Chart account. Instructions are located on the last page of this paperwork. If you have not heard from us regarding the results in 2 weeks, please contact this office. °  ° ° ° °

## 2019-11-07 LAB — COMPREHENSIVE METABOLIC PANEL
ALT: 36 IU/L — ABNORMAL HIGH (ref 0–32)
AST: 23 IU/L (ref 0–40)
Albumin/Globulin Ratio: 2 (ref 1.2–2.2)
Albumin: 4.5 g/dL (ref 3.9–5.0)
Alkaline Phosphatase: 56 IU/L (ref 48–121)
BUN/Creatinine Ratio: 16 (ref 9–23)
BUN: 13 mg/dL (ref 6–20)
Bilirubin Total: 0.6 mg/dL (ref 0.0–1.2)
CO2: 23 mmol/L (ref 20–29)
Calcium: 9 mg/dL (ref 8.7–10.2)
Chloride: 102 mmol/L (ref 96–106)
Creatinine, Ser: 0.82 mg/dL (ref 0.57–1.00)
GFR calc Af Amer: 111 mL/min/{1.73_m2} (ref >59)
GFR calc non Af Amer: 96 mL/min/{1.73_m2} (ref >59)
Globulin, Total: 2.2 g/dL (ref 1.5–4.5)
Glucose: 84 mg/dL (ref 65–99)
Potassium: 4 mmol/L (ref 3.5–5.2)
Sodium: 137 mmol/L (ref 134–144)
Total Protein: 6.7 g/dL (ref 6.0–8.5)

## 2019-11-07 LAB — TSH: TSH: 1.42 u[IU]/mL (ref 0.450–4.500)

## 2019-11-07 LAB — LIPID PANEL
Chol/HDL Ratio: 2.9 ratio (ref 0.0–4.4)
Cholesterol, Total: 160 mg/dL (ref 100–199)
HDL: 56 mg/dL (ref >39)
LDL Chol Calc (NIH): 93 mg/dL (ref 0–99)
Triglycerides: 52 mg/dL (ref 0–149)
VLDL Cholesterol Cal: 11 mg/dL (ref 5–40)

## 2019-11-07 LAB — CBC WITH DIFFERENTIAL
Basophils Absolute: 0 10*3/uL (ref 0.0–0.2)
Basos: 1 %
EOS (ABSOLUTE): 0.3 10*3/uL (ref 0.0–0.4)
Eos: 5 %
Hematocrit: 33 % — ABNORMAL LOW (ref 34.0–46.6)
Hemoglobin: 11.4 g/dL (ref 11.1–15.9)
Immature Grans (Abs): 0 10*3/uL (ref 0.0–0.1)
Immature Granulocytes: 0 %
Lymphocytes Absolute: 3 10*3/uL (ref 0.7–3.1)
Lymphs: 51 %
MCH: 34.7 pg — ABNORMAL HIGH (ref 26.6–33.0)
MCHC: 34.5 g/dL (ref 31.5–35.7)
MCV: 100 fL — ABNORMAL HIGH (ref 79–97)
Monocytes Absolute: 0.6 10*3/uL (ref 0.1–0.9)
Monocytes: 11 %
Neutrophils Absolute: 1.9 10*3/uL (ref 1.4–7.0)
Neutrophils: 32 %
RBC: 3.29 x10E6/uL — ABNORMAL LOW (ref 3.77–5.28)
RDW: 10.8 % — ABNORMAL LOW (ref 11.7–15.4)
WBC: 5.9 10*3/uL (ref 3.4–10.8)

## 2019-11-07 LAB — VITAMIN D 25 HYDROXY (VIT D DEFICIENCY, FRACTURES): Vit D, 25-Hydroxy: 12.7 ng/mL — ABNORMAL LOW (ref 30.0–100.0)

## 2019-11-10 ENCOUNTER — Encounter: Payer: Self-pay | Admitting: Registered Nurse

## 2019-11-10 DIAGNOSIS — E559 Vitamin D deficiency, unspecified: Secondary | ICD-10-CM

## 2019-11-10 LAB — TOXASSURE SELECT 13 (MW), URINE

## 2019-11-10 MED ORDER — VITAMIN D (ERGOCALCIFEROL) 1.25 MG (50000 UNIT) PO CAPS: 50000.0000 [IU] | ORAL_CAPSULE | ORAL | 0 refills | Status: DC

## 2019-12-01 ENCOUNTER — Other Ambulatory Visit: Payer: Self-pay

## 2019-12-01 ENCOUNTER — Encounter: Payer: Self-pay | Admitting: Registered Nurse

## 2019-12-01 ENCOUNTER — Ambulatory Visit (INDEPENDENT_AMBULATORY_CARE_PROVIDER_SITE_OTHER): Payer: Medicaid Other | Admitting: Registered Nurse

## 2019-12-01 VITALS — BP 115/75 | HR 85 | Temp 97.9°F | Ht 62.0 in | Wt 137.8 lb

## 2019-12-01 DIAGNOSIS — Z124 Encounter for screening for malignant neoplasm of cervix: Secondary | ICD-10-CM | POA: Diagnosis not present

## 2019-12-01 DIAGNOSIS — Z0000 Encounter for general adult medical examination without abnormal findings: Secondary | ICD-10-CM

## 2019-12-01 NOTE — Progress Notes (Signed)
Established Patient Office Visit  Subjective:  Patient ID: Jennifer Peters, female    DOB: [birth year]  Age: 29 y.o. MRN: [id number]  CC:  Chief Complaint  Patient presents with  . Annual Exam    HPI Jennifer Peters presents for CPE  No concerns  Reviewed labs - will continue Vit D supplement and return in a few weeks for recheck Will start iron supplement for low counts on CBC Hopefully these will help with her chronic fatigue  Otherwise no updates to histories on review.   Past Medical History:  Diagnosis Date  . Abnormal Pap smear of cervix    unknown stage of abnormality  . Anemia   . Hx of chlamydia infection   . Low vitamin D level 05/10/2016  . Post partum depression     Past Surgical History:  Procedure Laterality Date  . CESAREAN SECTION N/A 09/25/2016   Procedure: CESAREAN SECTION;  Surgeon: Adam Phenix, MD;  Location: [facility] BIRTHING SUITES;  Service: Obstetrics;  Laterality: N/A;  . DILATION AND CURETTAGE OF UTERUS    . LAPAROSCOPIC APPENDECTOMY  06/14/2012   Procedure: APPENDECTOMY LAPAROSCOPIC;  Surgeon: Clovis Pu. Cornett, MD;  Location: [facility] OR;  Service: General;  Laterality: N/A;  . wisdom tooth extracted x1 in 04/2011      Family History  Problem Relation Age of Onset  . Kidney disease Maternal Grandmother   . Heart disease Maternal Grandmother        chf  . Diabetes Maternal Aunt   . Hypertension Mother   . Hypertension Father   . Other Neg Hx     Social History   Socioeconomic History  . Marital status: Single    Spouse name: [name]  . Number of children: 3  . Years of education: 76  . Highest education level: Not on file  Occupational History  . Occupation: Conservation officer, nature    Comment: [employer]  . Occupation: FOB    Comment: unemployed  Tobacco Use  . Smoking status: Former Smoker    Packs/day: 0.50    Years: 3.00    Pack years: 1.50    Quit date: 08/21/2011    Years since quitting: 8.2  . Smokeless tobacco: Never Used  Vaping  Use  . Vaping Use: Never used  Substance and Sexual Activity  . Alcohol use: No  . Drug use: No  . Sexual activity: Yes    Birth control/protection: Condom  Other Topics Concern  . Not on file  Social History Narrative   Pt is single. FOB is age 82.    Social Determinants of Health   Financial Resource Strain:   . Difficulty of Paying Living Expenses:   Food Insecurity:   . Worried About Programme researcher, broadcasting/film/video in the Last Year:   . Barista in the Last Year:   Transportation Needs:   . Freight forwarder (Medical):   Marland Kitchen Lack of Transportation (Non-Medical):   Physical Activity:   . Days of Exercise per Week:   . Minutes of Exercise per Session:   Stress:   . Feeling of Stress :   Social Connections:   . Frequency of Communication with Friends and Family:   . Frequency of Social Gatherings with Friends and Family:   . Attends Religious Services:   . Active Member of Clubs or Organizations:   . Attends Banker Meetings:   Marland Kitchen Marital Status:   Intimate Partner Violence:   . Fear of  Current or Ex-Partner:   . Emotionally Abused:   Marland Kitchen Physically Abused:   . Sexually Abused:     Outpatient Medications Prior to Visit  Medication Sig Dispense Refill  . Vitamin D, Ergocalciferol, (DRISDOL) 1.25 MG (50000 UNIT) CAPS capsule Take 1 capsule (50,000 Units total) by mouth every 7 (seven) days. 8 capsule 0   No facility-administered medications prior to visit.    Allergies  Allergen Reactions  . Latex Anaphylaxis, Itching and Rash    ROS Review of Systems Negative on 12 pt ROS    Objective:    Physical Exam Vitals and nursing note reviewed.  Constitutional:      General: She is not in acute distress.    Appearance: Normal appearance. She is normal weight. She is not ill-appearing, toxic-appearing or diaphoretic.  HENT:     Head: Normocephalic and atraumatic.     Right Ear: Tympanic membrane, ear canal and external ear normal. There is no impacted  cerumen.     Left Ear: Tympanic membrane, ear canal and external ear normal. There is no impacted cerumen.     Nose: Nose normal. No congestion.     Mouth/Throat:     Mouth: Mucous membranes are moist.     Pharynx: Oropharynx is clear. No oropharyngeal exudate or posterior oropharyngeal erythema.  Eyes:     General: No scleral icterus.       Right eye: No discharge.        Left eye: No discharge.     Extraocular Movements: Extraocular movements intact.     Conjunctiva/sclera: Conjunctivae normal.     Pupils: Pupils are equal, round, and reactive to light.  Neck:     Vascular: No carotid bruit.  Cardiovascular:     Rate and Rhythm: Normal rate and regular rhythm.     Pulses: Normal pulses.     Heart sounds: Normal heart sounds. No murmur heard.  No friction rub. No gallop.   Pulmonary:     Effort: Pulmonary effort is normal. No respiratory distress.     Breath sounds: Normal breath sounds. No stridor. No wheezing, rhonchi or rales.  Chest:     Chest wall: No tenderness.  Abdominal:     General: Abdomen is flat. Bowel sounds are normal. There is no distension.     Palpations: Abdomen is soft. There is no mass.     Tenderness: There is no abdominal tenderness. There is no right CVA tenderness, left CVA tenderness, guarding or rebound.     Hernia: No hernia is present.  Genitourinary:    Comments: Deferred per pt Musculoskeletal:        General: No swelling, tenderness, deformity or signs of injury. Normal range of motion.     Cervical back: Normal range of motion and neck supple. No rigidity or tenderness.     Right lower leg: No edema.     Left lower leg: No edema.  Lymphadenopathy:     Cervical: No cervical adenopathy.  Skin:    General: Skin is warm and dry.     Capillary Refill: Capillary refill takes less than 2 seconds.     Coloration: Skin is not jaundiced or pale.     Findings: No bruising, erythema or lesion.  Neurological:     General: No focal deficit present.      Mental Status: She is alert and oriented to person, place, and time. Mental status is at baseline.     Cranial Nerves: No cranial nerve deficit.  Psychiatric:        Mood and Affect: Mood normal.        Behavior: Behavior normal.        Thought Content: Thought content normal.        Judgment: Judgment normal.     BP 115/75 (BP Location: Right Arm, Patient Position: Sitting, Cuff Size: Normal)   Pulse 85   Temp 97.9 F (36.6 C) (Temporal)   Ht 5\' 2"  (1.575 m)   Wt 137 lb 12.8 oz (62.5 kg)   LMP 11/13/2019   SpO2 100%   BMI 25.20 kg/m  Wt Readings from Last 3 Encounters:  12/01/19 137 lb 12.8 oz (62.5 kg)  11/06/19 137 lb 3.2 oz (62.2 kg)  12/13/18 124 lb 6.4 oz (56.4 kg)     Health Maintenance Due  Topic Date Due  . Hepatitis C Screening  Never done  . PAP SMEAR-Modifier  05/03/2019    There are no preventive care reminders to display for this patient.  Lab Results  Component Value Date   TSH 1.420 11/06/2019   Lab Results  Component Value Date   WBC 5.9 11/06/2019   HGB 11.4 11/06/2019   HCT 33.0 (L) 11/06/2019   MCV 100 (H) 11/06/2019   PLT 271 12/13/2018   Lab Results  Component Value Date   NA 137 11/06/2019   K 4.0 11/06/2019   CO2 23 11/06/2019   GLUCOSE 84 11/06/2019   BUN 13 11/06/2019   CREATININE 0.82 11/06/2019   BILITOT 0.6 11/06/2019   ALKPHOS 56 11/06/2019   AST 23 11/06/2019   ALT 36 (H) 11/06/2019   PROT 6.7 11/06/2019   ALBUMIN 4.5 11/06/2019   CALCIUM 9.0 11/06/2019   ANIONGAP 13 12/13/2018   Lab Results  Component Value Date   CHOL 160 11/06/2019   Lab Results  Component Value Date   HDL 56 11/06/2019   Lab Results  Component Value Date   LDLCALC 93 11/06/2019   Lab Results  Component Value Date   TRIG 52 11/06/2019   Lab Results  Component Value Date   CHOLHDL 2.9 11/06/2019   Lab Results  Component Value Date   HGBA1C 4.5 (L) 05/02/2016      Assessment & Plan:   Problem List Items Addressed This Visit      None    Visit Diagnoses    Annual physical exam    -  Primary   Screening for cervical cancer       Relevant Orders   Ambulatory referral to Gynecology      No orders of the defined types were placed in this encounter.   Follow-up: No follow-ups on file.   PLAN  Exam unremarkable  Labs and histories reviewed  Return for repeat Vit D testing. May repeat CBC at that time if desired  Patient encouraged to call clinic with any questions, comments, or concerns.  05/04/2016, NP

## 2019-12-01 NOTE — Patient Instructions (Signed)
° ° ° °  If you have lab work done today you will be contacted with your lab results within the next 2 weeks.  If you have not heard from us then please contact us. The fastest way to get your results is to register for My Chart. ° ° °IF you received an x-ray today, you will receive an invoice from [facility]. Please contact [facility] at [phone] with questions or concerns regarding your invoice.  ° °IF you received labwork today, you will receive an invoice from [facility]. Please contact [facility] at [phone] with questions or concerns regarding your invoice.  ° °Our billing staff will not be able to assist you with questions regarding bills from these companies. ° °You will be contacted with the lab results as soon as they are available. The fastest way to get your results is to activate your My Chart account. Instructions are located on the last page of this paperwork. If you have not heard from us regarding the results in 2 weeks, please contact this office. °  ° ° ° °

## 2020-02-24 ENCOUNTER — Encounter (HOSPITAL_COMMUNITY): Payer: Self-pay

## 2020-02-24 ENCOUNTER — Emergency Department (HOSPITAL_COMMUNITY): Payer: Medicaid Other

## 2020-02-24 ENCOUNTER — Emergency Department (HOSPITAL_COMMUNITY)
Admission: EM | Admit: 2020-02-24 | Discharge: 2020-02-24 | Disposition: A | Discharge status: Home / Self Care | Payer: Medicaid Other | Attending: Emergency Medicine | Admitting: Emergency Medicine

## 2020-02-24 DIAGNOSIS — Z87891 Personal history of nicotine dependence: Secondary | ICD-10-CM | POA: Diagnosis not present

## 2020-02-24 DIAGNOSIS — W1842XA Slipping, tripping and stumbling without falling due to stepping into hole or opening, initial encounter: Secondary | ICD-10-CM | POA: Diagnosis not present

## 2020-02-24 DIAGNOSIS — S99921A Unspecified injury of right foot, initial encounter: Secondary | ICD-10-CM | POA: Diagnosis present

## 2020-02-24 DIAGNOSIS — S93601A Unspecified sprain of right foot, initial encounter: Secondary | ICD-10-CM | POA: Diagnosis not present

## 2020-02-24 MED ORDER — IBUPROFEN 600 MG PO TABS: 600.0000 mg | ORAL_TABLET | Freq: Four times a day (QID) | ORAL | 0 refills | Status: DC | PRN

## 2020-02-24 MED ORDER — CYCLOBENZAPRINE HCL 10 MG PO TABS: 10.0000 mg | ORAL_TABLET | Freq: Two times a day (BID) | ORAL | 0 refills | Status: DC | PRN

## 2020-02-24 MED ORDER — IBUPROFEN 800 MG PO TABS
800.0000 mg | ORAL_TABLET | Freq: Once | ORAL | Status: AC
  Administered 2020-02-24: 800 mg via ORAL
  Filled 2020-02-24: qty 1

## 2020-02-24 NOTE — ED Provider Notes (Signed)
[facility]-EMERGENCY DEPT Provider Note   CSN: [id number] Arrival date & time: 02/24/20  1706     History No chief complaint on file.   Jennifer Peters is a 29 y.o. female.  The history is provided by the patient. No language interpreter was used.     29 year old female presenting for evaluation of foot injury.  Patient states at 8 AM this morning she was stepping and accidentally twisted her right foot inward.  She report acute onset of pain to the foot.  Pain is sharp, throbbing, on the dorsum of the foot.  She was able to ambulate on it and she did take some Tylenol.  She went to sleep and when she woke up she noticed worsening pain to the foot and having difficulty ambulating.  Pain is nonradiating.  No ankle pain.  No numbness.  She is not pregnant.  She denies any knee pain.  Past Medical History:  Diagnosis Date  . Abnormal Pap smear of cervix    unknown stage of abnormality  . Anemia   . Hx of chlamydia infection   . Low vitamin D level 05/10/2016  . Post partum depression     Patient Active Problem List   Diagnosis Date Noted  . Encounter for IUD insertion 11/06/2016  . 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 CESAREAN SECTION N/A 09/25/2016   Procedure: CESAREAN SECTION;  Surgeon: Adam Phenix, MD;  Location: [facility] BIRTHING SUITES;  Service: Obstetrics;  Laterality: N/A;  . DILATION AND CURETTAGE OF UTERUS    . LAPAROSCOPIC APPENDECTOMY  06/14/2012   Procedure: APPENDECTOMY LAPAROSCOPIC;  Surgeon: Clovis Pu. Cornett, MD;  Location: [facility] OR;  Service: General;  Laterality: N/A;  . wisdom tooth extracted x1 in 04/2011       OB History    Gravida  4   Para  3   Term  2   Preterm  1   AB  1   Living  2     SAB  1   TAB  0   Ectopic  0   Multiple  0   Live Births  2           Family History  Problem Relation Age of Onset  . Kidney disease Maternal Grandmother   . Heart disease  Maternal Grandmother        chf  . Diabetes Maternal Aunt   . Hypertension Mother   . Hypertension Father   . Other Neg Hx     Social History   Tobacco Use  . Smoking status: Former Smoker    Packs/day: 0.50    Years: 3.00    Pack years: 1.50    Quit date: 08/21/2011    Years since quitting: 8.5  . Smokeless tobacco: Never Used  Vaping Use  . Vaping Use: Never used  Substance Use Topics  . Alcohol use: No  . Drug use: No    Home Medications Prior to Admission medications   Medication Sig Start Date End Date Taking? Authorizing Provider  Vitamin D, Ergocalciferol, (DRISDOL) 1.25 MG (50000 UNIT) CAPS capsule Take 1 capsule (50,000 Units total) by mouth every 7 (seven) days. 11/10/19   Janeece Agee, NP    Allergies    Latex  Review of Systems   Review of Systems  Constitutional: Negative for fever.  Musculoskeletal: Positive for arthralgias.  Skin: Negative for wound.  Neurological: Negative for numbness.  Physical Exam Updated Vital Signs BP 101/64 (BP Location: Right Arm)   Pulse 81   Temp 98.8 F (37.1 C) (Oral)   Resp 17   SpO2 94%   Physical Exam Vitals and nursing note reviewed.  Constitutional:      General: She is not in acute distress.    Appearance: She is well-developed.  HENT:     Head: Atraumatic.  Eyes:     Conjunctiva/sclera: Conjunctivae normal.  Musculoskeletal:        General: Tenderness (Right foot: Tenderness to midfoot on the dorsum without significant edema crepitus or deformity noted.  No pain at fifth metatarsal region.  DP pulse palpable.  Right ankle nontender with full range of motion.) present.     Cervical back: Neck supple.  Skin:    Findings: No rash.  Neurological:     Mental Status: She is alert.     ED Results / Procedures / Treatments   Labs (all labs ordered are listed, but only abnormal results are displayed) Labs Reviewed - No data to display  EKG None  Radiology DG Foot Complete Right  Result Date:  02/24/2020 CLINICAL DATA:  Lateral right foot pain, external rotation injury, swelling EXAM: RIGHT FOOT COMPLETE - 3+ VIEW COMPARISON:  None. FINDINGS: Frontal, oblique, lateral views of the right foot are obtained. No fracture, subluxation, or dislocation. Joint spaces are well preserved. Soft tissues are normal. IMPRESSION: 1. No acute bony abnormality. Electronically Signed   By: Sharlet Salina M.D.   On: 02/24/2020 18:15    Procedures Procedures (including critical care time)  Medications Ordered in ED Medications  ibuprofen (ADVIL) tablet 800 mg (800 mg Oral Given 02/24/20 1758)    ED Course  I have reviewed the triage vital signs and the nursing notes.  Pertinent labs & imaging results that were available during my care of the patient were reviewed by me and considered in my medical decision making (see chart for details).    MDM Rules/Calculators/A&P                          BP 101/64 (BP Location: Right Arm)   Pulse 81   Temp 98.8 F (37.1 C) (Oral)   Resp 17   LMP 02/11/2020   SpO2 94%   Final Clinical Impression(s) / ED Diagnoses Final diagnoses:  Right foot sprain, initial encounter    Rx / DC Orders ED Discharge Orders         Ordered    ibuprofen (ADVIL) 600 MG tablet  Every 6 hours PRN        02/24/20 1856    cyclobenzaprine (FLEXERIL) 10 MG tablet  2 times daily PRN        02/24/20 1856         5:33 PM Patient twisted her right foot earlier today.  I suspect this is likely a sprain.  X-ray ordered to rule out fracture.  Ibuprofen given for pain.  6:55 PM X-ray of right foot without any acute abnormalities.  Will provide Ace wrap, and postop shoe for support.  Rice therapy discussed.  Orthopedic referral given as needed.   Fayrene Helper, PA-C 02/24/20 1856    Lorre Nick, MD 02/29/20 [phone]

## 2020-02-24 NOTE — ED Triage Notes (Signed)
Pt states this morning she slipped injuring her right foot. Pt states she cannot bear weight on her right foot and has noticed her foot is starting to swell.

## 2020-06-17 ENCOUNTER — Emergency Department (HOSPITAL_COMMUNITY)
Admission: EM | Admit: 2020-06-17 | Discharge: 2020-06-17 | Disposition: A | Discharge status: Home / Self Care | Payer: Medicaid Other | Attending: Emergency Medicine | Admitting: Emergency Medicine

## 2020-06-17 ENCOUNTER — Encounter (HOSPITAL_COMMUNITY): Payer: Self-pay | Admitting: Emergency Medicine

## 2020-06-17 ENCOUNTER — Other Ambulatory Visit: Payer: Self-pay

## 2020-06-17 DIAGNOSIS — Z87891 Personal history of nicotine dependence: Secondary | ICD-10-CM | POA: Diagnosis not present

## 2020-06-17 DIAGNOSIS — R0981 Nasal congestion: Secondary | ICD-10-CM | POA: Diagnosis present

## 2020-06-17 DIAGNOSIS — J069 Acute upper respiratory infection, unspecified: Secondary | ICD-10-CM

## 2020-06-17 DIAGNOSIS — U071 COVID-19: Secondary | ICD-10-CM | POA: Insufficient documentation

## 2020-06-17 NOTE — Discharge Instructions (Addendum)
Please return for any problems.  Follow-up with your regular care provider as instructed.  Testing results will be available tomorrow through MyChart.

## 2020-06-17 NOTE — ED Provider Notes (Signed)
[facility]-EMERGENCY DEPT Provider Note   CSN: [id number] Arrival date & time: 06/17/20  2019     History Chief Complaint  Patient presents with  . Sore Throat  . Covid Exposure    Jennifer Peters is a 30 y.o. female.  30 year old female with prior medical history as detailed below presents for evaluation.  Patient reports multiple known COVID-positive sick contacts.  Patient reports mild rhinorrhea and mild congestion over the last 2 to 3 days.  She denies shortness of breath.  She denies fever.  She reports that she had a COVID test performed Wednesday that was "inconclusive."  Patient is presenting tonight requesting confirmatory COVID testing.    The history is provided by the patient and medical records.  Illness Location:  Suspected covid Severity:  Mild Onset quality:  Gradual Duration:  3 days Timing:  Constant Progression:  Unchanged Chronicity:  New      Past Medical History:  Diagnosis Date  . Abnormal Pap smear of cervix    unknown stage of abnormality  . Anemia   . Hx of chlamydia infection   . Low vitamin D level 05/10/2016  . Post partum depression     Patient Active Problem List   Diagnosis Date Noted  . Encounter for IUD insertion 11/06/2016  . 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 CESAREAN SECTION N/A 09/25/2016   Procedure: CESAREAN SECTION;  Surgeon: Adam Phenix, MD;  Location: [facility] BIRTHING SUITES;  Service: Obstetrics;  Laterality: N/A;  . DILATION AND CURETTAGE OF UTERUS    . LAPAROSCOPIC APPENDECTOMY  06/14/2012   Procedure: APPENDECTOMY LAPAROSCOPIC;  Surgeon: Clovis Pu. Cornett, MD;  Location: [facility] OR;  Service: General;  Laterality: N/A;  . wisdom tooth extracted x1 in 04/2011       OB History    Gravida  4   Para  3   Term  2   Preterm  1   AB  1   Living  2     SAB  1   IAB  0   Ectopic  0   Multiple  0   Live Births  2           Family  History  Problem Relation Age of Onset  . Kidney disease Maternal Grandmother   . Heart disease Maternal Grandmother        chf  . Diabetes Maternal Aunt   . Hypertension Mother   . Hypertension Father   . Other Neg Hx     Social History   Tobacco Use  . Smoking status: Former Smoker    Packs/day: 0.50    Years: 3.00    Pack years: 1.50    Quit date: 08/21/2011    Years since quitting: 8.8  . Smokeless tobacco: Never Used  Vaping Use  . Vaping Use: Never used  Substance Use Topics  . Alcohol use: No  . Drug use: No    Home Medications Prior to Admission medications   Medication Sig Start Date End Date Taking? Authorizing Provider  acetaminophen (TYLENOL) 500 MG tablet Take 500 mg by mouth every 6 (six) hours as needed for moderate pain.    [provider]  cyclobenzaprine (FLEXERIL) 10 MG tablet Take 1 tablet (10 mg total) by mouth 2 (two) times daily as needed for muscle spasms. 02/24/20   Fayrene Helper, PA-C  ibuprofen (ADVIL) 600 MG tablet Take 1 tablet (600 mg total) by  mouth every 6 (six) hours as needed. 02/24/20   Fayrene Helper, PA-C  Vitamin D, Ergocalciferol, (DRISDOL) 1.25 MG (50000 UNIT) CAPS capsule Take 1 capsule (50,000 Units total) by mouth every 7 (seven) days. Patient not taking: Reported on 02/24/2020 11/10/19   Janeece Agee, NP    Allergies    Latex  Review of Systems   Review of Systems  All other systems reviewed and are negative.   Physical Exam Updated Vital Signs BP (!) 138/98 (BP Location: Left Arm)   Pulse 94   Temp 98.7 F (37.1 C) (Oral)   Resp 15   Ht 5\' 2"  (1.575 m)   Wt 62.1 kg   SpO2 99%   BMI 25.06 kg/m   Physical Exam Vitals and nursing note reviewed.  Constitutional:      General: She is not in acute distress.    Appearance: She is well-developed and well-nourished.  HENT:     Head: Normocephalic and atraumatic.     Nose: Congestion and rhinorrhea present.     Mouth/Throat:     Mouth: Oropharynx is clear and  moist. Mucous membranes are moist. No oral lesions.     Pharynx: Uvula midline. No pharyngeal swelling, oropharyngeal exudate or uvula swelling.     Tonsils: No tonsillar exudate or tonsillar abscesses.  Eyes:     Extraocular Movements: EOM normal.     Conjunctiva/sclera: Conjunctivae normal.     Pupils: Pupils are equal, round, and reactive to light.  Cardiovascular:     Rate and Rhythm: Normal rate and regular rhythm.     Heart sounds: Normal heart sounds.  Pulmonary:     Effort: Pulmonary effort is normal. No respiratory distress.     Breath sounds: Normal breath sounds.  Abdominal:     General: There is no distension.     Palpations: Abdomen is soft.     Tenderness: There is no abdominal tenderness.  Musculoskeletal:        General: No deformity or edema. Normal range of motion.     Cervical back: Normal range of motion and neck supple.  Skin:    General: Skin is warm and dry.  Neurological:     Mental Status: She is alert and oriented to person, place, and time.  Psychiatric:        Mood and Affect: Mood and affect normal.     ED Results / Procedures / Treatments   Labs (all labs ordered are listed, but only abnormal results are displayed) Labs Reviewed  SARS CORONAVIRUS 2 (TAT 6-24 HRS)    EKG None  Radiology No results found.  Procedures Procedures (including critical care time)  Medications Ordered in ED Medications - No data to display  ED Course  I have reviewed the triage vital signs and the nursing notes.  Pertinent labs & imaging results that were available during my care of the patient were reviewed by me and considered in my medical decision making (see chart for details).    MDM Rules/Calculators/A&P                          MDM  Screen complete  Jennifer Peters was evaluated in Emergency Department on 06/17/2020 for the symptoms described in the history of present illness. She was evaluated in the context of the global COVID-19 pandemic, which  necessitated consideration that the patient might be at risk for infection with the SARS-CoV-2 virus that causes COVID-19. Institutional protocols and algorithms  that pertain to the evaluation of patients at risk for COVID-19 are in a state of rapid change based on information released by regulatory bodies including the CDC and federal and state organizations. These policies and algorithms were followed during the patient's care in the ED.   Patient is presenting with mild symptoms consistent with likely early COVID infection.  Patient with multiple COVID positive sick contacts.  Patient is requesting COVID testing.  Patient understands how to follow-up on her test results to the MyChart system.  Importance of close follow-up stressed.  Strict return precautions given and understood. Final Clinical Impression(s) / ED Diagnoses Final diagnoses:  Upper respiratory tract infection, unspecified type    Rx / DC Orders ED Discharge Orders    None       Wynetta Fines, MD 06/17/20 2108

## 2020-06-17 NOTE — ED Triage Notes (Signed)
Patient presents with laryngitis and exposure to covid. Patient states she had a headache and post-nasal drip that began Monday and has progressed. Patient has a dry cough, irritated throat, but no congestion. Patient states her 2 children were positive and her test was "inconclusive." No other fevers. No chest pain or dyspnea.

## 2020-06-18 LAB — SARS CORONAVIRUS 2 (TAT 6-24 HRS): SARS Coronavirus 2: POSITIVE — AB

## 2020-07-21 ENCOUNTER — Encounter (HOSPITAL_COMMUNITY): Payer: Self-pay

## 2020-07-21 ENCOUNTER — Ambulatory Visit (HOSPITAL_COMMUNITY)
Admission: EM | Admit: 2020-07-21 | Discharge: 2020-07-21 | Disposition: A | Discharge status: Home / Self Care | Payer: Medicaid Other

## 2020-07-21 DIAGNOSIS — G43009 Migraine without aura, not intractable, without status migrainosus: Secondary | ICD-10-CM | POA: Diagnosis not present

## 2020-07-21 MED ORDER — SUMATRIPTAN SUCCINATE 25 MG PO TABS: 25.0000 mg | ORAL_TABLET | ORAL | 0 refills | Status: DC | PRN

## 2020-07-21 MED ORDER — SUMATRIPTAN SUCCINATE 6 MG/0.5ML ~~LOC~~ SOLN
6.0000 mg | Freq: Once | SUBCUTANEOUS | Status: AC
  Administered 2020-07-21: 6 mg via SUBCUTANEOUS

## 2020-07-21 MED ORDER — ONDANSETRON HCL 4 MG PO TABS: 4.0000 mg | ORAL_TABLET | Freq: Four times a day (QID) | ORAL | 0 refills | Status: DC

## 2020-07-21 MED ORDER — SUMATRIPTAN SUCCINATE 6 MG/0.5ML ~~LOC~~ SOLN
SUBCUTANEOUS | Status: AC
  Filled 2020-07-21: qty 0.5

## 2020-07-21 MED ORDER — ONDANSETRON 4 MG PO TBDP
ORAL_TABLET | ORAL | Status: AC
  Filled 2020-07-21: qty 1

## 2020-07-21 NOTE — Discharge Instructions (Addendum)
Can take pill every two hours up to four times a day  Can use 800 mg of ibuprofen three times a day with 650 mg of tylenol for another method of relief   If headaches become frequent, follow up with primary doctor for long term management, if pain worsens, headache persist, new symptoms begin can come back to Urgent Care for reevaluation

## 2020-07-21 NOTE — ED Provider Notes (Signed)
[facility]    CSN: [id number] Arrival date & time: 07/21/20  0254      History   Chief Complaint Chief Complaint  Patient presents with  . Headache    HPI Jennifer Peters is a 30 y.o. female.   Patient presents with dull pustulating right-sided headache since Sunday morning with no avail. Assocated floaters and phonophobia, Denies blurred vision, nausea, photophobia, congestion, sinus pain/pressure, changes in daily routine, increased stress. LMP 2/9. Attempted tylenol and motrin with no relief. No pertinent history.   Past Medical History:  Diagnosis Date  . Abnormal Pap smear of cervix    unknown stage of abnormality  . Anemia   . Hx of chlamydia infection   . Low vitamin D level 05/10/2016  . Post partum depression     Patient Active Problem List   Diagnosis Date Noted  . Encounter for IUD insertion 11/06/2016  . 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 CESAREAN SECTION N/A 09/25/2016   Procedure: CESAREAN SECTION;  Surgeon: Adam Phenix, MD;  Location: [facility] BIRTHING SUITES;  Service: Obstetrics;  Laterality: N/A;  . DILATION AND CURETTAGE OF UTERUS    . LAPAROSCOPIC APPENDECTOMY  06/14/2012   Procedure: APPENDECTOMY LAPAROSCOPIC;  Surgeon: Clovis Pu. Cornett, MD;  Location: [facility] OR;  Service: General;  Laterality: N/A;  . wisdom tooth extracted x1 in 04/2011      OB History    Gravida  4   Para  3   Term  2   Preterm  1   AB  1   Living  2     SAB  1   IAB  0   Ectopic  0   Multiple  0   Live Births  2            Home Medications    Prior to Admission medications   Medication Sig Start Date End Date Taking? Authorizing Provider  acetaminophen (TYLENOL) 500 MG tablet Take 500 mg by mouth every 6 (six) hours as needed for moderate pain.    [provider]  cyclobenzaprine (FLEXERIL) 10 MG tablet Take 1 tablet (10 mg total) by mouth 2 (two) times daily as needed for muscle  spasms. 02/24/20   Fayrene Helper, PA-C  ibuprofen (ADVIL) 600 MG tablet Take 1 tablet (600 mg total) by mouth every 6 (six) hours as needed. 02/24/20   Fayrene Helper, PA-C  Vitamin D, Ergocalciferol, (DRISDOL) 1.25 MG (50000 UNIT) CAPS capsule Take 1 capsule (50,000 Units total) by mouth every 7 (seven) days. Patient not taking: Reported on 02/24/2020 11/10/19   Janeece Agee, NP    Family History Family History  Problem Relation Age of Onset  . Kidney disease Maternal Grandmother   . Heart disease Maternal Grandmother        chf  . Diabetes Maternal Aunt   . Hypertension Mother   . Hypertension Father   . Other Neg Hx     Social History Social History   Tobacco Use  . Smoking status: Former Smoker    Packs/day: 0.50    Years: 3.00    Pack years: 1.50    Quit date: 08/21/2011    Years since quitting: 8.9  . Smokeless tobacco: Never Used  Vaping Use  . Vaping Use: Never used  Substance Use Topics  . Alcohol use: No  . Drug use: No     Allergies   Latex   Review of Systems  Review of Systems  Constitutional: Negative.   HENT: Negative.   Eyes: Negative.   Respiratory: Negative.   Cardiovascular: Negative.   Gastrointestinal: Negative.   Endocrine: Negative.   Genitourinary: Negative.   Musculoskeletal: Negative.   Neurological: Positive for headaches. Negative for dizziness, tremors, seizures, syncope, facial asymmetry, speech difficulty, weakness, light-headedness and numbness.  Psychiatric/Behavioral: Negative.      Physical Exam Triage Vital Signs ED Triage Vitals [07/21/20 0957]  Enc Vitals Group     BP 116/61     Pulse Rate 82     Resp 18     Temp 98.2 F (36.8 C)     Temp src      SpO2 100 %     Weight      Height      Head Circumference      Peak Flow      Pain Score 8     Pain Loc      Pain Edu?      Excl. in GC?    No data found.  Updated Vital Signs BP 116/61   Pulse 82   Temp 98.2 F (36.8 C)   Resp 18   LMP 07/15/2020   SpO2 100%    Visual Acuity Right Eye Distance:   Left Eye Distance:   Bilateral Distance:    Right Eye Near:   Left Eye Near:    Bilateral Near:     Physical Exam Constitutional:      Appearance: She is well-developed and normal weight.  HENT:     Head: Normocephalic.  Eyes:     Extraocular Movements: Extraocular movements intact.  Pulmonary:     Effort: Pulmonary effort is normal.  Musculoskeletal:        General: Normal range of motion.     Cervical back: Normal range of motion.  Skin:    General: Skin is warm and dry.  Neurological:     Mental Status: She is alert and oriented to person, place, and time. Mental status is at baseline.     Cranial Nerves: No cranial nerve deficit or facial asymmetry.  Psychiatric:        Mood and Affect: Mood normal.        Speech: Speech normal.        Behavior: Behavior normal.      UC Treatments / Results  Labs (all labs ordered are listed, but only abnormal results are displayed) Labs Reviewed - No data to display  EKG   Radiology No results found.  Procedures Procedures (including critical care time)  Medications Ordered in UC Medications - No data to display  Initial Impression / Assessment and Plan / UC Course  I have reviewed the triage vital signs and the nursing notes.  Pertinent labs & imaging results that were available during my care of the patient were reviewed by me and considered in my medical decision making (see chart for details).  Migraine without aura  1. Imitrex 6 mg once 2. Imitrex 25 mg tablet every two hours as needed 3. Encouraged continue use of ibuprofen and tylenol for additional medication  4. Zofran 4 mg given now, zofran 4 mg every 6 hours as needed, began mildly nausea after dose of Imitrex  5. If headaches reoccurring, follow up with PCP for long term medication, for worsening, presistent headache or new symptoms can follow up with UC for reevaluation  Final Clinical Impressions(s) / UC  Diagnoses   Final diagnoses:  None  Discharge Instructions   None    ED Prescriptions    None     PDMP not reviewed this encounter.   Valinda Hoar, NP 07/21/20 1056

## 2020-07-21 NOTE — ED Triage Notes (Signed)
Pt presents with c/o headache since Sunday, has taken tylenol and motrin with no relief

## 2020-07-27 ENCOUNTER — Other Ambulatory Visit (HOSPITAL_COMMUNITY)
Admission: RE | Admit: 2020-07-27 | Discharge: 2020-07-27 | Disposition: A | Discharge status: Home / Self Care | Payer: Medicaid Other | Source: Ambulatory Visit | Attending: Registered Nurse | Admitting: Registered Nurse

## 2020-07-27 ENCOUNTER — Encounter: Payer: Self-pay | Admitting: Registered Nurse

## 2020-07-27 ENCOUNTER — Ambulatory Visit: Payer: Medicaid Other | Admitting: Registered Nurse

## 2020-07-27 ENCOUNTER — Other Ambulatory Visit: Payer: Self-pay

## 2020-07-27 VITALS — BP 101/71 | HR 85 | Temp 98.0°F | Resp 18 | Ht 62.0 in | Wt 138.8 lb

## 2020-07-27 DIAGNOSIS — M549 Dorsalgia, unspecified: Secondary | ICD-10-CM

## 2020-07-27 LAB — POCT URINALYSIS DIP (CLINITEK)
Bilirubin, UA: NEGATIVE
Blood, UA: NEGATIVE
Glucose, UA: NEGATIVE mg/dL
Ketones, POC UA: NEGATIVE mg/dL
Leukocytes, UA: NEGATIVE
Nitrite, UA: NEGATIVE
POC PROTEIN,UA: NEGATIVE
Spec Grav, UA: 1.025 (ref 1.010–1.025)
Urobilinogen, UA: 4 E.U./dL — AB
pH, UA: 6 (ref 5.0–8.0)

## 2020-07-27 NOTE — Patient Instructions (Signed)
° ° ° °  If you have lab work done today you will be contacted with your lab results within the next 2 weeks.  If you have not heard from us then please contact us. The fastest way to get your results is to register for My Chart. ° ° °IF you received an x-ray today, you will receive an invoice from [facility]. Please contact [facility] at [phone] with questions or concerns regarding your invoice.  ° °IF you received labwork today, you will receive an invoice from [facility]. Please contact [facility] at [phone] with questions or concerns regarding your invoice.  ° °Our billing staff will not be able to assist you with questions regarding bills from these companies. ° °You will be contacted with the lab results as soon as they are available. The fastest way to get your results is to activate your My Chart account. Instructions are located on the last page of this paperwork. If you have not heard from us regarding the results in 2 weeks, please contact this office. °  ° ° ° °

## 2020-07-28 LAB — COMPREHENSIVE METABOLIC PANEL
ALT: 11 IU/L (ref 0–32)
AST: 12 IU/L (ref 0–40)
Albumin/Globulin Ratio: 2.4 — ABNORMAL HIGH (ref 1.2–2.2)
Albumin: 4.4 g/dL (ref 3.8–4.8)
Alkaline Phosphatase: 44 IU/L (ref 44–121)
BUN/Creatinine Ratio: 17 (ref 9–23)
BUN: 13 mg/dL (ref 6–20)
Bilirubin Total: 0.6 mg/dL (ref 0.0–1.2)
CO2: 22 mmol/L (ref 20–29)
Calcium: 8.9 mg/dL (ref 8.7–10.2)
Chloride: 105 mmol/L (ref 96–106)
Creatinine, Ser: 0.76 mg/dL (ref 0.57–1.00)
GFR calc Af Amer: 121 mL/min/{1.73_m2} (ref >59)
GFR calc non Af Amer: 105 mL/min/{1.73_m2} (ref >59)
Globulin, Total: 1.8 g/dL (ref 1.5–4.5)
Glucose: 80 mg/dL (ref 65–99)
Potassium: 3.6 mmol/L (ref 3.5–5.2)
Sodium: 142 mmol/L (ref 134–144)
Total Protein: 6.2 g/dL (ref 6.0–8.5)

## 2020-07-28 LAB — CBC WITH DIFFERENTIAL
Basophils Absolute: 0 10*3/uL (ref 0.0–0.2)
Basos: 0 %
EOS (ABSOLUTE): 0.2 10*3/uL (ref 0.0–0.4)
Eos: 3 %
Hematocrit: 34.4 % (ref 34.0–46.6)
Hemoglobin: 11.3 g/dL (ref 11.1–15.9)
Immature Grans (Abs): 0 10*3/uL (ref 0.0–0.1)
Immature Granulocytes: 0 %
Lymphocytes Absolute: 3.7 10*3/uL — ABNORMAL HIGH (ref 0.7–3.1)
Lymphs: 56 %
MCH: 34.8 pg — ABNORMAL HIGH (ref 26.6–33.0)
MCHC: 32.8 g/dL (ref 31.5–35.7)
MCV: 106 fL — ABNORMAL HIGH (ref 79–97)
Monocytes Absolute: 0.5 10*3/uL (ref 0.1–0.9)
Monocytes: 7 %
Neutrophils Absolute: 2.3 10*3/uL (ref 1.4–7.0)
Neutrophils: 34 %
RBC: 3.25 x10E6/uL — ABNORMAL LOW (ref 3.77–5.28)
RDW: 11 % — ABNORMAL LOW (ref 11.7–15.4)
WBC: 6.7 10*3/uL (ref 3.4–10.8)

## 2020-07-28 LAB — HIV ANTIBODY (ROUTINE TESTING W REFLEX): HIV Screen 4th Generation wRfx: NONREACTIVE

## 2020-07-28 LAB — AMYLASE: Amylase: 65 U/L (ref 31–110)

## 2020-07-28 LAB — LIPASE: Lipase: 30 U/L (ref 14–72)

## 2020-07-28 LAB — TSH: TSH: 0.258 u[IU]/mL — ABNORMAL LOW (ref 0.450–4.500)

## 2020-07-28 LAB — VITAMIN D 25 HYDROXY (VIT D DEFICIENCY, FRACTURES): Vit D, 25-Hydroxy: 24.2 ng/mL — ABNORMAL LOW (ref 30.0–100.0)

## 2020-07-29 LAB — URINE CYTOLOGY ANCILLARY ONLY
Chlamydia: POSITIVE — AB
Comment: NEGATIVE
Comment: NEGATIVE
Comment: NORMAL
Neisseria Gonorrhea: NEGATIVE
Trichomonas: NEGATIVE

## 2020-07-29 LAB — URINE CULTURE

## 2020-08-01 ENCOUNTER — Telehealth: Payer: Self-pay | Admitting: Registered Nurse

## 2020-08-01 NOTE — Telephone Encounter (Addendum)
08/01/2020 - PATIENT HAD SOME RECENT LAB WORK THAT RICH MORROW ORDERED ON 07/27/2020. HE SENT HER A MY-CHART MESSAGE SAYING SHE WAS POSITIVE FOR CHLAMYDIA. HE SAID HE WAS GOING TO SEND IN DOXYCYLINE 100 mg TO HER PHARMACY BUT NOTHING HAS BEEN SENT. BEST PHONE [phone] (CELL)  PHARMACY CHOICE IS: [facility] ([address]) [facility]

## 2020-08-02 ENCOUNTER — Other Ambulatory Visit: Payer: Self-pay | Admitting: Registered Nurse

## 2020-08-02 DIAGNOSIS — A749 Chlamydial infection, unspecified: Secondary | ICD-10-CM

## 2020-08-02 MED ORDER — DOXYCYCLINE HYCLATE 100 MG PO TABS: 100.0000 mg | ORAL_TABLET | Freq: Two times a day (BID) | ORAL | 0 refills | Status: DC

## 2020-08-02 NOTE — Telephone Encounter (Signed)
Sent ? ?Thanks, ? ?Rich

## 2021-04-02 ENCOUNTER — Other Ambulatory Visit: Payer: Self-pay

## 2021-04-02 ENCOUNTER — Encounter (HOSPITAL_COMMUNITY): Payer: Self-pay | Admitting: Emergency Medicine

## 2021-04-02 ENCOUNTER — Emergency Department (HOSPITAL_COMMUNITY)
Admission: EM | Admit: 2021-04-02 | Discharge: 2021-04-03 | Disposition: A | Discharge status: Home / Self Care | Payer: Medicaid Other | Attending: Physician Assistant | Admitting: Physician Assistant

## 2021-04-02 DIAGNOSIS — Z5321 Procedure and treatment not carried out due to patient leaving prior to being seen by health care provider: Secondary | ICD-10-CM | POA: Diagnosis not present

## 2021-04-02 DIAGNOSIS — Z20822 Contact with and (suspected) exposure to covid-19: Secondary | ICD-10-CM | POA: Insufficient documentation

## 2021-04-02 DIAGNOSIS — R519 Headache, unspecified: Secondary | ICD-10-CM | POA: Diagnosis not present

## 2021-04-02 DIAGNOSIS — J029 Acute pharyngitis, unspecified: Secondary | ICD-10-CM | POA: Diagnosis not present

## 2021-04-02 LAB — RESP PANEL BY RT-PCR (FLU A&B, COVID) ARPGX2
Influenza A by PCR: NEGATIVE
Influenza B by PCR: NEGATIVE
SARS Coronavirus 2 by RT PCR: NEGATIVE

## 2021-04-02 LAB — GROUP A STREP BY PCR: Group A Strep by PCR: NOT DETECTED

## 2021-04-02 NOTE — ED Provider Notes (Signed)
Emergency Medicine Provider Triage Evaluation Note  Jennifer Peters , a 30 y.o. female  was evaluated in triage.  Pt complains of sore throat.  No fever or chills.  No runny nose.  Symptoms started Thursday night.  No known sick contacts. Had a headache Wednesday and Thursday.   Review of Systems  Positive: Sore throat.   Negative: Fevers, cough  Physical Exam  BP 121/75   Pulse 86   Temp 98.6 F (37 C) (Oral)   Resp 12   SpO2 100%  Gen:   Awake, no distress   Resp:  Normal effort  MSK:   Moves extremities without difficulty  Other:  Normal speech and phonation.  Tolerates secretions with out difficulty.   Medical Decision Making  Medically screening exam initiated at 5:38 PM.  Appropriate orders placed.  Jennifer Peters was informed that the remainder of the evaluation will be completed by another provider, this initial triage assessment does not replace that evaluation, and the importance of remaining in the ED until their evaluation is complete.     Cristina Gong, PA-C 04/02/21 1743    Tegeler, Canary Brim, MD 04/02/21 2100

## 2021-04-02 NOTE — ED Triage Notes (Signed)
Pt sore throat x 3 days.  Denies fever and chills.

## 2021-04-03 NOTE — ED Notes (Signed)
Pt decided to leave 

## 2021-04-17 ENCOUNTER — Ambulatory Visit: Payer: Medicaid Other

## 2021-04-18 ENCOUNTER — Other Ambulatory Visit: Payer: Self-pay

## 2021-04-18 ENCOUNTER — Ambulatory Visit
Admission: EM | Admit: 2021-04-18 | Discharge: 2021-04-18 | Disposition: A | Discharge status: Home / Self Care | Payer: Medicaid Other | Attending: Emergency Medicine | Admitting: Emergency Medicine

## 2021-04-18 DIAGNOSIS — R109 Unspecified abdominal pain: Secondary | ICD-10-CM | POA: Insufficient documentation

## 2021-04-18 DIAGNOSIS — R8290 Unspecified abnormal findings in urine: Secondary | ICD-10-CM | POA: Insufficient documentation

## 2021-04-18 DIAGNOSIS — N390 Urinary tract infection, site not specified: Secondary | ICD-10-CM | POA: Insufficient documentation

## 2021-04-18 LAB — POCT URINE PREGNANCY: Preg Test, Ur: NEGATIVE

## 2021-04-18 LAB — POCT URINALYSIS DIP (MANUAL ENTRY)
Bilirubin, UA: NEGATIVE
Glucose, UA: NEGATIVE mg/dL
Ketones, POC UA: NEGATIVE mg/dL
Nitrite, UA: POSITIVE — AB
Protein Ur, POC: 100 mg/dL — AB
Spec Grav, UA: >=1.03 — AB (ref 1.010–1.025)
Urobilinogen, UA: 1 E.U./dL
pH, UA: 6 (ref 5.0–8.0)

## 2021-04-18 MED ORDER — SULFAMETHOXAZOLE-TRIMETHOPRIM 800-160 MG PO TABS: 1.0000 | ORAL_TABLET | Freq: Two times a day (BID) | ORAL | 0 refills | Status: DC

## 2021-04-18 NOTE — ED Triage Notes (Signed)
Pt reports possibly having a UTI, she states just changes her body wash, urine cloudiness, left and right flank pain.  Started: Sunday

## 2021-04-18 NOTE — ED Provider Notes (Signed)
[facility]    CSN: [id number] Arrival date & time: 04/18/21  0816   History   Chief Complaint Chief Complaint  Patient presents with   Flank Pain   HPI Nhung Neiderhiser is a 30 y.o. female. Patient complains on left and right sided flank pain.  Patient states she may have a UTI, states she recently changed her body wash and has noticed that her urine has become more cloudy.  Patient states her symptoms started 2 days ago.  Patient denies fever, aches, chills, nausea, vomiting, diarrhea, burning with urination, pelvic pain, pelvic pressure, vaginal discharge.  EMR reviewed by me, patient has a history of chlamydia, diagnosed February 2022.  The history is provided by the patient.   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 Cornett, MD;  Location: [place];  Service: General;  Laterality: N/A;   wisdom tooth extracted x1 in 04/2011     OB History     Gravida  4   Para  3   Term  2   Preterm  1   AB  1   Living  2      SAB  1   IAB  0   Ectopic  0   Multiple  0   Live Births  2          Home Medications    Prior to Admission medications   Medication Sig Start Date End Date Taking? Authorizing Provider  sulfamethoxazole-trimethoprim (BACTRIM DS) 800-160 MG tablet Take 1 tablet by mouth 2 (two) times daily for 5 days.  04/18/21 04/23/21 Yes Lynden Oxford Scales, PA-C  acetaminophen (TYLENOL) 500 MG tablet Take 500 mg by mouth every 6 (six) hours as needed for moderate pain.    [provider]  ibuprofen (ADVIL) 600 MG tablet Take 1 tablet (600 mg total) by mouth every 6 (six) hours as needed. 02/24/20   Domenic Moras, PA-C   Family History Family History  Problem Relation Age of Onset   Kidney disease Maternal Grandmother    Heart disease Maternal Grandmother        chf   Diabetes Maternal Aunt    Hypertension Mother    Hypertension Father    Other Neg Hx    Social History Social History   Tobacco Use   Smoking status: Former    Packs/day: 0.50    Years: 3.00    Pack years: 1.50    Types: Cigarettes    Quit date: 08/21/2011    Years since quitting: 9.6   Smokeless tobacco: Never  Vaping Use   Vaping Use: Never used  Substance Use Topics   Alcohol use: No   Drug use: No   Allergies   Latex  Review of Systems Review of Systems Pertinent findings noted in history  of present illness.   Physical Exam Triage Vital Signs ED Triage Vitals  Enc Vitals Group     BP 03/31/21 0827 (!) 147/82     Pulse Rate 03/31/21 0827 72     Resp 03/31/21 0827 18     Temp 03/31/21 0827 98.3 F (36.8 C)     Temp Source 03/31/21 0827 Oral     SpO2 03/31/21 0827 98 %     Weight --      Height --      Head Circumference --      Peak Flow --      Pain Score 03/31/21 0826 5     Pain Loc --      Pain Edu? --      Excl. in [place]? --    No data found.  Updated Vital Signs BP 106/75 (BP Location: Left Arm)   Pulse 81   Temp 98.5 F (36.9 C) (Oral)   Resp 18   LMP 03/30/2021   SpO2 98%   Visual Acuity Right Eye Distance:   Left Eye Distance:   Bilateral Distance:    Right Eye Near:   Left Eye Near:    Bilateral Near:     Physical Exam Vitals and nursing note reviewed.  Constitutional:      General: She is not in acute distress.    Appearance: Normal appearance. She is not  ill-appearing.  HENT:     Head: Normocephalic and atraumatic.  Eyes:     General: Lids are normal.        Right eye: No discharge.        Left eye: No discharge.     Extraocular Movements: Extraocular movements intact.     Conjunctiva/sclera: Conjunctivae normal.     Right eye: Right conjunctiva is not injected.     Left eye: Left conjunctiva is not injected.  Neck:     Trachea: Trachea and phonation normal.  Cardiovascular:     Rate and Rhythm: Normal rate and regular rhythm.     Pulses: Normal pulses.     Heart sounds: Normal heart sounds. No murmur heard.   No friction rub. No gallop.  Pulmonary:     Effort: Pulmonary effort is normal. No accessory muscle usage, prolonged expiration or respiratory distress.     Breath sounds: Normal breath sounds. No stridor, decreased air movement or transmitted upper airway sounds. No decreased breath sounds, wheezing, rhonchi or rales.  Chest:     Chest wall: No tenderness.  Abdominal:     General: Abdomen is flat. Bowel sounds are normal. There is no distension.     Palpations: Abdomen is soft.     Tenderness: There is no abdominal tenderness. There is no right CVA tenderness or left CVA tenderness.     Hernia: No hernia is present.  Genitourinary:    Comments: Pt politely declines GU exam, pt did provide a swab for testing.   Musculoskeletal:        General: Normal range of motion.     Cervical back: Normal range of motion and neck supple. Normal range of motion.  Lymphadenopathy:     Cervical: No cervical adenopathy.  Skin:    General: Skin is warm and dry.     Findings: No erythema or rash.  Neurological:     General: No focal deficit present.     Mental Status: She is alert and oriented to person, place, and time.  Psychiatric:  Mood and Affect: Mood normal.        Behavior: Behavior normal.   UC Treatments / Results  Labs (all labs ordered are listed, but only abnormal results are displayed)  Labs Reviewed  POCT  URINALYSIS DIP (MANUAL ENTRY) - Abnormal; Notable for the following components:      Result Value   Clarity, UA cloudy (*)    Spec Grav, UA >=1.030 (*)    Blood, UA small (*)    Protein Ur, POC =100 (*)    Nitrite, UA Positive (*)    Leukocytes, UA Small (1+) (*)    All other components within normal limits  URINE CULTURE  POCT URINE PREGNANCY  CERVICOVAGINAL ANCILLARY ONLY    EKG  Radiology No results found.  Procedures Procedures (including critical care time)  Medications Ordered in UC Medications - No data to display  Initial Impression / Assessment and Plan / UC Course  I have reviewed the triage vital signs and the nursing notes.  Pertinent labs & imaging results that were available during my care of the patient were reviewed by me and considered in my medical decision making (see chart for details).      Urine dip today is positive for nitrites, urine culture will be sent per protocol.  Patient advised to begin Bactrim now and that if adjustments need to be made to her regimen that will be provided for her as well.  STD screening results to be provided to her MyChart, if abnormal treatment will be provided for this as well.  Return precautions advised.  Final Clinical Impressions(s) / UC Diagnoses   Final diagnoses:  Cloudy urine  Acute left flank pain  Acute right flank pain  Acute lower urinary tract infection     Discharge Instructions      Your urinalysis today is positive for acute urinary tract infection.  Please begin Bactrim, 1 tablet twice daily for the next 5 days to begin treatment now.  Urine culture will be performed per our protocol.  The results of urine culture should be available in the next 3 to 5 days and will be posted to your MyChart account.  If there are any abnormal results, you will be contacted by phone and if further treatment is needed, this will be provided for you.  The results of your STD testing today will also be made available  to you once received.  They will be posted to your MyChart and, if any of your results are abnormal, you will receive a phone call with those results along with further instructions regarding treatment.      ED Prescriptions     Medication Sig Dispense Auth. Provider   sulfamethoxazole-trimethoprim (BACTRIM DS) 800-160 MG tablet Take 1 tablet by mouth 2 (two) times daily for 5 days. 10 tablet Lynden Oxford Scales, PA-C      PDMP not reviewed this encounter.  Disposition Upon Discharge:  Patient presented with an acute illness with associated systemic symptoms and significant discomfort requiring urgent management. In my opinion, this is a condition that a prudent lay person (someone who possesses an average knowledge of health and medicine) may potentially expect to result in complications if not addressed urgently such as respiratory distress, impairment of bodily function or dysfunction of bodily organs.   Routine symptom specific, illness specific and/or disease specific instructions were discussed with the patient and/or caregiver at length.   As such, the patient has been evaluated and assessed, work-up was performed and treatment  was provided in alignment with urgent care protocols and evidence based medicine.  Patient/parent/caregiver has been advised that the patient may require follow up for further testing and treatment if the symptoms continue in spite of treatment, as clinically indicated and appropriate.  Patient/parent/caregiver has been advised to return to the [facility] or PCP in 3-5 days if no better; to PCP or the Emergency Department if new signs and symptoms develop, or if the current signs or symptoms continue to change or worsen for further workup, evaluation and treatment as clinically indicated and appropriate  The patient will follow up with their current PCP if and as advised. If the patient does not currently have a PCP we will assist them in obtaining one.     Patient/parent/caregiver verbalized understanding and agreement of plan as discussed.  All questions were addressed during visit.  Please see discharge instructions below for further details of plan.  Condition: stable for discharge home Home: take medications as prescribed; routine discharge instructions as discussed; follow up as advised.    Theadora Rama Scales, [place] 04/18/21 [phone]

## 2021-04-18 NOTE — Discharge Instructions (Addendum)
Your urinalysis today is positive for acute urinary tract infection.  Please begin Bactrim, 1 tablet twice daily for the next 5 days to begin treatment now.  Urine culture will be performed per our protocol.  The results of urine culture should be available in the next 3 to 5 days and will be posted to your MyChart account.  If there are any abnormal results, you will be contacted by phone and if further treatment is needed, this will be provided for you.  The results of your STD testing today will also be made available to you once received.  They will be posted to your MyChart and, if any of your results are abnormal, you will receive a phone call with those results along with further instructions regarding treatment.

## 2021-04-19 ENCOUNTER — Ambulatory Visit: Payer: Medicaid Other

## 2021-04-19 LAB — CERVICOVAGINAL ANCILLARY ONLY
Bacterial Vaginitis (gardnerella): POSITIVE — AB
Candida Glabrata: NEGATIVE
Candida Vaginitis: NEGATIVE
Chlamydia: NEGATIVE
Comment: NEGATIVE
Comment: NEGATIVE
Comment: NEGATIVE
Comment: NEGATIVE
Comment: NEGATIVE
Comment: NORMAL
Neisseria Gonorrhea: NEGATIVE
Trichomonas: NEGATIVE

## 2021-04-20 ENCOUNTER — Telehealth (HOSPITAL_COMMUNITY): Payer: Self-pay | Admitting: Emergency Medicine

## 2021-04-20 MED ORDER — METRONIDAZOLE 500 MG PO TABS: 500.0000 mg | ORAL_TABLET | Freq: Two times a day (BID) | ORAL | 0 refills | Status: DC

## 2021-04-21 ENCOUNTER — Telehealth (HOSPITAL_COMMUNITY): Payer: Self-pay | Admitting: Emergency Medicine

## 2021-04-21 LAB — URINE CULTURE: Culture: >=100000 — AB

## 2021-04-21 MED ORDER — NITROFURANTOIN MONOHYD MACRO 100 MG PO CAPS: 100.0000 mg | ORAL_CAPSULE | Freq: Two times a day (BID) | ORAL | 0 refills | Status: DC

## 2021-04-23 NOTE — Progress Notes (Signed)
Established Patient Office Visit  Subjective:  Patient ID: Jennifer Peters, female    DOB: 1991-01-09  Age: 30 y.o. MRN: [id number]  CC:  Chief Complaint  Patient presents with   Abdominal Pain    Patient states she has been experiencing some abdominal and some back pain as well for 3 days    HPI Jennifer Peters presents for abdominal and back pain  Ongoing 3 days. No injury, trauma, new diet, exercise, or other changes preceding onset. No nvd, chest pain, headaches, visual changes, weight loss, blood in stool or steatorrhea. Aching, dull pain Question of urinary frequency Back pain is lower back, bilateral.  Past Medical History:  Diagnosis Date   Abnormal Pap smear of cervix    unknown stage of abnormality   Anemia    Hx of chlamydia infection    Low vitamin D level 05/10/2016   Post partum depression     Past Surgical History:  Procedure Laterality Date   CESAREAN SECTION N/A 09/25/2016   Procedure: CESAREAN SECTION;  Surgeon: Adam Phenix, MD;  Location: [facility];  Service: Obstetrics;  Laterality: N/A;   DILATION AND CURETTAGE OF UTERUS     LAPAROSCOPIC APPENDECTOMY  06/14/2012   Procedure: APPENDECTOMY LAPAROSCOPIC;  Surgeon: Clovis Pu. Cornett, MD;  Location: [facility];  Service: General;  Laterality: N/A;   wisdom tooth extracted x1 in 04/2011      Family History  Problem Relation Age of Onset   Kidney disease Maternal Grandmother    Heart disease Maternal Grandmother        chf   Diabetes Maternal Aunt    Hypertension Mother    Hypertension Father    Other Neg Hx     Social History   Socioeconomic History   Marital status: Single    Spouse name: Jennifer Peters. Lanca   Number of children: 3   Years of education: 12   Highest education level: Not on file  Occupational History   Occupation: Conservation officer, nature    Comment: [employer]   Occupation: FOB    Comment: unemployed  Tobacco Use   Smoking status: Former    Packs/day: 0.50    Years: 3.00    Pack  years: 1.50    Types: Cigarettes    Quit date: 08/21/2011    Years since quitting: 9.6   Smokeless tobacco: Never  Vaping Use   Vaping Use: Never used  Substance and Sexual Activity   Alcohol use: No   Drug use: No   Sexual activity: Yes    Birth control/protection: Condom  Other Topics Concern   Not on file  Social History Narrative   Pt is single. FOB is age 49.    Social Determinants of Health   Financial Resource Strain: Not on file  Food Insecurity: Not on file  Transportation Needs: Not on file  Physical Activity: Not on file  Stress: Not on file  Social Connections: Not on file  Intimate Partner Violence: Not on file    Outpatient Medications Prior to Visit  Medication Sig Dispense Refill   acetaminophen (TYLENOL) 500 MG tablet Take 500 mg by mouth every 6 (six) hours as needed for moderate pain.     ibuprofen (ADVIL) 600 MG tablet Take 1 tablet (600 mg total) by mouth every 6 (six) hours as needed. 30 tablet 0   cyclobenzaprine (FLEXERIL) 10 MG tablet Take 1 tablet (10 mg total) by mouth 2 (two) times daily as needed for muscle spasms. (Patient not  taking: Reported on 07/27/2020) 20 tablet 0   ondansetron (ZOFRAN) 4 MG tablet Take 1 tablet (4 mg total) by mouth every 6 (six) hours. (Patient not taking: Reported on 07/27/2020) 12 tablet 0   SUMAtriptan (IMITREX) 25 MG tablet Take 1 tablet (25 mg total) by mouth every 2 (two) hours as needed for migraine. May repeat in 2 hours if headache persists or recurs. (Patient not taking: Reported on 07/27/2020) 12 tablet 0   Vitamin D, Ergocalciferol, (DRISDOL) 1.25 MG (50000 UNIT) CAPS capsule Take 1 capsule (50,000 Units total) by mouth every 7 (seven) days. (Patient not taking: No sig reported) 8 capsule 0   No facility-administered medications prior to visit.    Allergies  Allergen Reactions   Latex Anaphylaxis, Itching and Rash    ROS Review of Systems  Constitutional: Negative.   HENT: Negative.    Eyes: Negative.    Respiratory: Negative.    Cardiovascular: Negative.   Gastrointestinal: Negative.   Genitourinary: Negative.   Musculoskeletal: Negative.   Skin: Negative.   Neurological: Negative.   Psychiatric/Behavioral: Negative.    All other systems reviewed and are negative.    Objective:    Physical Exam Vitals and nursing note reviewed.  Constitutional:      General: She is not in acute distress.    Appearance: Normal appearance. She is normal weight. She is not ill-appearing, toxic-appearing or diaphoretic.  Cardiovascular:     Rate and Rhythm: Normal rate and regular rhythm.     Heart sounds: Normal heart sounds. No murmur heard.   No friction rub. No gallop.  Pulmonary:     Effort: Pulmonary effort is normal. No respiratory distress.     Breath sounds: Normal breath sounds. No stridor. No wheezing, rhonchi or rales.  Chest:     Chest wall: No tenderness.  Abdominal:     General: Bowel sounds are normal.     Palpations: Abdomen is soft.     Tenderness: There is no abdominal tenderness. There is no right CVA tenderness or left CVA tenderness.     Hernia: No hernia is present.  Skin:    General: Skin is warm and dry.  Neurological:     General: No focal deficit present.     Mental Status: She is alert and oriented to person, place, and time. Mental status is at baseline.  Psychiatric:        Mood and Affect: Mood normal.        Behavior: Behavior normal.        Thought Content: Thought content normal.        Judgment: Judgment normal.    BP 101/71   Pulse 85   Temp 98 F (36.7 C) (Temporal)   Resp 18   Ht 5\' 2"  (1.575 m)   Wt 138 lb 12.8 oz (63 kg)   LMP 07/15/2020   SpO2 99%   BMI 25.39 kg/m  Wt Readings from Last 3 Encounters:  07/27/20 138 lb 12.8 oz (63 kg)  06/17/20 137 lb (62.1 kg)  12/01/19 137 lb 12.8 oz (62.5 kg)     Health Maintenance Due  Topic Date Due   PAP SMEAR-Modifier  05/03/2019   COVID-19 Vaccine (4 - Booster for Pfizer series) 09/17/2020    INFLUENZA VACCINE  01/02/2021    There are no preventive care reminders to display for this patient.  Lab Results  Component Value Date   TSH 0.258 (L) 07/27/2020   Lab Results  Component Value Date  WBC 6.7 07/27/2020   HGB 11.3 07/27/2020   HCT 34.4 07/27/2020   MCV 106 (H) 07/27/2020   PLT 271 12/13/2018   Lab Results  Component Value Date   NA 142 07/27/2020   K 3.6 07/27/2020   CO2 22 07/27/2020   GLUCOSE 80 07/27/2020   BUN 13 07/27/2020   CREATININE 0.76 07/27/2020   BILITOT 0.6 07/27/2020   ALKPHOS 44 07/27/2020   AST 12 07/27/2020   ALT 11 07/27/2020   PROT 6.2 07/27/2020   ALBUMIN 4.4 07/27/2020   CALCIUM 8.9 07/27/2020   ANIONGAP 13 12/13/2018   Lab Results  Component Value Date   CHOL 160 11/06/2019   Lab Results  Component Value Date   HDL 56 11/06/2019   Lab Results  Component Value Date   LDLCALC 93 11/06/2019   Lab Results  Component Value Date   TRIG 52 11/06/2019   Lab Results  Component Value Date   CHOLHDL 2.9 11/06/2019   Lab Results  Component Value Date   HGBA1C 4.5 (L) 05/02/2016      Assessment & Plan:   Problem List Items Addressed This Visit   None Visit Diagnoses     Back pain, unspecified back location, unspecified back pain laterality, unspecified chronicity    -  Primary   Relevant Orders   POCT URINALYSIS DIP (CLINITEK) (Completed)   CBC With Differential (Completed)   Comprehensive metabolic panel (Completed)   TSH (Completed)   Lipase (Completed)   Amylase (Completed)   Urine Culture (Completed)   Urine cytology ancillary only (Completed)   Vitamin D, 25-hydroxy (Completed)   HIV antibody (with reflex) (Completed)       No orders of the defined types were placed in this encounter.   Follow-up: No follow-ups on file.   PLAN Unclear etiology. Labs collected, will follow up as warranted Recommend BRAT diet, adequate hydration, food journal, stool journal, and monitoring for any patterns or  changes in symptoms. Patient encouraged to call clinic with any questions, comments, or concerns.  Janeece Agee, NP

## 2021-08-07 ENCOUNTER — Ambulatory Visit
Admission: RE | Admit: 2021-08-07 | Discharge: 2021-08-07 | Disposition: A | Discharge status: Home / Self Care | Payer: Medicaid Other | Source: Ambulatory Visit | Attending: Emergency Medicine | Admitting: Emergency Medicine

## 2021-08-07 ENCOUNTER — Other Ambulatory Visit: Payer: Self-pay

## 2021-08-07 VITALS — BP 114/81 | HR 79 | Temp 98.5°F | Resp 15

## 2021-08-07 DIAGNOSIS — R8290 Unspecified abnormal findings in urine: Secondary | ICD-10-CM | POA: Insufficient documentation

## 2021-08-07 DIAGNOSIS — R109 Unspecified abdominal pain: Secondary | ICD-10-CM | POA: Diagnosis not present

## 2021-08-07 LAB — POCT URINALYSIS DIP (MANUAL ENTRY)
Bilirubin, UA: NEGATIVE
Blood, UA: NEGATIVE
Glucose, UA: NEGATIVE mg/dL
Ketones, POC UA: NEGATIVE mg/dL
Leukocytes, UA: NEGATIVE
Nitrite, UA: NEGATIVE
Protein Ur, POC: 30 mg/dL — AB
Spec Grav, UA: 1.015 (ref 1.010–1.025)
Urobilinogen, UA: 1 E.U./dL
pH, UA: 8.5 — AB (ref 5.0–8.0)

## 2021-08-07 LAB — POCT URINE PREGNANCY: Preg Test, Ur: NEGATIVE

## 2021-08-07 MED ORDER — SULFAMETHOXAZOLE-TRIMETHOPRIM 800-160 MG PO TABS: 1.0000 | ORAL_TABLET | Freq: Two times a day (BID) | ORAL | 0 refills | Status: AC

## 2021-08-07 NOTE — Discharge Instructions (Signed)
The urinalysis that we performed in the clinic today was abnormal.  Urine culture will be performed per our protocol.  The result of the urine culture will be available in the next 3 to 5 days and will be posted to your MyChart account.  If there is an abnormal finding, you will be contacted by phone and advised of further treatment recommendations, if any. ?  ?You were advised to begin antibiotics today because you are having active symptoms of a urinary tract infection.  It is very important that you take all doses exactly as prescribed.  Incomplete antibiotic therapy can cause worsening urinary tract infection that can become aggressive, reach the level of your kidneys causing kidney infection and possible hospitalization. ?  ?If you receive a phone call advising you that your urine culture is negative but you are feeling significantly better after starting antibiotics, I recommend that you complete the full course of antibiotics as prescribed.   ?  ?If you receive a phone call advising you that your urine culture is negative and you are not feeling significantly better after starting antibiotics, please feel free to discontinue antibiotics as they are no longer indicated. ? ?The results of your STD testing today will be made available to you once they are complete, this typically takes 3 to 5 days.  They will initially be posted to your MyChart and, if any of your results are abnormal, you will receive a phone call with those results along with further instructions regarding treatment and any prescriptions, if needed.  In particular, if your BV test is positive, we will provide you with a prescription for the metronidazole vaginal gel instead of the tablets. ?  ?If you have not had complete resolution of your symptoms after completing treatment as prescribed, please return to urgent care for repeat evaluation or follow-up with your primary care provider. ?  ?Thank you for visiting urgent care today.  I  appreciate the opportunity to participate in your care. ? ?

## 2021-08-07 NOTE — ED Triage Notes (Signed)
Pt reports having lower back pains that has been intermittent since last week. Reports pain more consistent on right side. Concerned for UTI even though doesn't have blood, dysuria or frequency. Tried tylenol and ibuprofen for pain and didn't help.  ?Reports that had BV when see here last time and didn't finish the medication due to being nasty (oral medications).  ?

## 2021-08-07 NOTE — ED Provider Notes (Signed)
[facility]    CSN: [id number] Arrival date & time: 08/07/21  1235    HISTORY   Chief Complaint  Patient presents with   appt 1230   Back Pain   HPI Jennifer Peters is a 31 y.o. female. Pt reports having lower back pains that has been intermittent since last week. Reports pain more consistent on right side. Concerned for UTI even though doesn't have blood in her urine, burning with urination or increased frequency of urination. Tried tylenol and ibuprofen for pain which didn't help.  Reports that she also tested positive for BV when she was here last time but did not finish the medication due to it tasting bad.   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 Cornett, MD;  Location: [facility] OR;  Service: General;  Laterality: N/A;   wisdom tooth extracted x1 in 04/2011     OB History     Gravida  4   Para  3   Term  2   Preterm  1   AB  1   Living  2      SAB  1   IAB  0   Ectopic  0   Multiple  0   Live Births  2          Home Medications    Prior to Admission medications   Medication Sig Start Date End Date Taking? Authorizing Provider  acetaminophen (TYLENOL) 500 MG tablet Take 500 mg by mouth every 6 (six) hours as needed for moderate pain.    [provider]  ibuprofen (ADVIL) 600 MG tablet Take 1 tablet  (600 mg total) by mouth every 6 (six) hours as needed. 02/24/20   Fayrene Helper, PA-C   Family History Family History  Problem Relation Age of Onset   Kidney disease Maternal Grandmother    Heart disease Maternal Grandmother        chf   Diabetes Maternal Aunt    Hypertension Mother    Hypertension Father    Other Neg Hx    Social History Social History   Tobacco Use   Smoking status: Former    Packs/day: 0.50    Years: 3.00    Pack years: 1.50    Types: Cigarettes    Quit date: 08/21/2011    Years since quitting: 9.9   Smokeless tobacco: Never  Vaping Use   Vaping Use: Never used  Substance Use Topics   Alcohol use: No   Drug use: No   Allergies   Latex  Review of Systems Review of Systems Pertinent findings noted in history of present illness.   Physical Exam Triage Vital Signs ED Triage Vitals  Enc Vitals Group     BP 03/31/21 0827 (!) 147/82     Pulse Rate  03/31/21 0827 72     Resp 03/31/21 0827 18     Temp 03/31/21 0827 98.3 F (36.8 C)     Temp Source 03/31/21 0827 Oral     SpO2 03/31/21 0827 98 %     Weight --      Height --      Head Circumference --      Peak Flow --      Pain Score 03/31/21 0826 5     Pain Loc --      Pain Edu? --      Excl. in GC? --   No data found.  Updated Vital Signs BP 114/81 (BP Location: Right Arm)    Pulse 79    Temp 98.5 F (36.9 C) (Oral)    Resp 15    SpO2 98%   Physical Exam Vitals and nursing note reviewed.  Constitutional:      General: She is not in acute distress.    Appearance: Normal appearance. She is not ill-appearing.  HENT:     Head: Normocephalic and atraumatic.  Eyes:     General: Lids are normal.        Right eye: No discharge.        Left eye: No discharge.     Extraocular Movements: Extraocular movements intact.     Conjunctiva/sclera: Conjunctivae normal.     Right eye: Right conjunctiva is not injected.     Left eye: Left conjunctiva is not injected.  Neck:     Trachea: Trachea and  phonation normal.  Cardiovascular:     Rate and Rhythm: Normal rate and regular rhythm.     Pulses: Normal pulses.     Heart sounds: Normal heart sounds. No murmur heard.   No friction rub. No gallop.  Pulmonary:     Effort: Pulmonary effort is normal. No accessory muscle usage, prolonged expiration or respiratory distress.     Breath sounds: Normal breath sounds. No stridor, decreased air movement or transmitted upper airway sounds. No decreased breath sounds, wheezing, rhonchi or rales.  Chest:     Chest wall: No tenderness.  Abdominal:     General: Abdomen is flat. Bowel sounds are normal. There is no distension.     Palpations: Abdomen is soft.     Tenderness: There is abdominal tenderness in the suprapubic area. There is right CVA tenderness. There is no left CVA tenderness.     Hernia: No hernia is present.  Genitourinary:    Comments: Patient politely declines pelvic exam today, patient provided a vaginal swab for testing. Musculoskeletal:        General: Normal range of motion.     Cervical back: Normal range of motion and neck supple. Normal range of motion.  Lymphadenopathy:     Cervical: No cervical adenopathy.  Skin:    General: Skin is warm and dry.     Findings: No erythema or rash.  Neurological:     General: No focal deficit present.     Mental Status: She is alert and oriented to person, place, and time.  Psychiatric:        Mood and Affect: Mood normal.        Behavior: Behavior normal.    Visual Acuity Right Eye Distance:   Left Eye Distance:   Bilateral Distance:    Right Eye Near:   Left Eye Near:    Bilateral Near:     UC Couse / Diagnostics / Procedures:    EKG  Radiology No results found.  Procedures Procedures (including critical care time)  UC Diagnoses / Final Clinical Impressions(s)   I have reviewed the triage vital signs and the nursing notes.  Pertinent labs & imaging results that were available during my care of the patient were  reviewed by me and considered in my medical decision making (see chart for details).    Final diagnoses:  Acute right flank pain  Abnormal result on screening urine test   STD screening was performed, patient advised that the results be posted to their MyChart and if any of the results are positive, they will be notified by phone, further treatment will be provided as indicated based on results of STD screening.  Patient was advised to begin antibiotics now due to findings on urine dip. Patient advised that they will be contacted with results of the urine culture and that treatment will be adjusted if indicated based on results. Return precautions advised.  ED Prescriptions     Medication Sig Dispense Auth. Provider   sulfamethoxazole-trimethoprim (BACTRIM DS) 800-160 MG tablet Take 1 tablet by mouth 2 (two) times daily for 5 days. 10 tablet Theadora Rama Scales, PA-C      PDMP not reviewed this encounter.  Pending results:  Labs Reviewed  POCT URINALYSIS DIP (MANUAL ENTRY) - Abnormal; Notable for the following components:      Result Value   pH, UA 8.5 (*)    Protein Ur, POC =30 (*)    All other components within normal limits  URINE CULTURE  POCT URINE PREGNANCY  CERVICOVAGINAL ANCILLARY ONLY    Medications Ordered in UC: Medications - No data to display  Disposition Upon Discharge:  Condition: stable for discharge home  Patient presented with concern for an acute illness with associated systemic symptoms and significant discomfort requiring urgent management. In my opinion, this is a condition that a prudent lay person (someone who possesses an average knowledge of health and medicine) may potentially expect to result in complications if not addressed urgently such as respiratory distress, impairment of bodily function or dysfunction of bodily organs.   As such, the patient has been evaluated and assessed, work-up was performed and treatment was provided in alignment  with urgent care protocols and evidence based medicine.  Patient/parent/caregiver has been advised that the patient may require follow up for further testing and/or treatment if the symptoms continue in spite of treatment, as clinically indicated and appropriate.  Routine symptom specific, illness specific and/or disease specific instructions were discussed with the patient and/or caregiver at length.  Prevention strategies for avoiding STD exposure were also discussed.  The patient will follow up with their current PCP if and as advised. If the patient does not currently have a PCP we will assist them in obtaining one.   The patient may need specialty follow up if the symptoms continue, in spite of conservative treatment and management, for further workup, evaluation, consultation and treatment as clinically indicated and appropriate.  Patient/parent/caregiver verbalized understanding and agreement of plan as discussed.  All questions were addressed during visit.  Please see discharge instructions below for further details of plan.  Discharge Instructions:   Discharge Instructions      The urinalysis that we performed in the clinic today was abnormal.  Urine culture will be performed per our protocol.  The result of the urine culture will be available in the next 3 to 5 days and will be posted to your MyChart account.  If there is an abnormal  finding, you will be contacted by phone and advised of further treatment recommendations, if any.   You were advised to begin antibiotics today because you are having active symptoms of a urinary tract infection.  It is very important that you take all doses exactly as prescribed.  Incomplete antibiotic therapy can cause worsening urinary tract infection that can become aggressive, reach the level of your kidneys causing kidney infection and possible hospitalization.   If you receive a phone call advising you that your urine culture is negative but you are  feeling significantly better after starting antibiotics, I recommend that you complete the full course of antibiotics as prescribed.     If you receive a phone call advising you that your urine culture is negative and you are not feeling significantly better after starting antibiotics, please feel free to discontinue antibiotics as they are no longer indicated.  The results of your STD testing today will be made available to you once they are complete, this typically takes 3 to 5 days.  They will initially be posted to your MyChart and, if any of your results are abnormal, you will receive a phone call with those results along with further instructions regarding treatment and any prescriptions, if needed.  In particular, if your BV test is positive, we will provide you with a prescription for the metronidazole vaginal gel instead of the tablets.   If you have not had complete resolution of your symptoms after completing treatment as prescribed, please return to urgent care for repeat evaluation or follow-up with your primary care provider.   Thank you for visiting urgent care today.  I appreciate the opportunity to participate in your care.       This office note has been dictated using Teaching laboratory technician.  Unfortunately, and despite my best efforts, this method of dictation can sometimes lead to occasional typographical or grammatical errors.  I apologize in advance if this occurs.      Theadora Rama Scales, PA-C 08/07/21 1319

## 2021-08-08 LAB — CERVICOVAGINAL ANCILLARY ONLY
Bacterial Vaginitis (gardnerella): POSITIVE — AB
Candida Glabrata: NEGATIVE
Candida Vaginitis: POSITIVE — AB
Chlamydia: NEGATIVE
Comment: NEGATIVE
Comment: NEGATIVE
Comment: NEGATIVE
Comment: NEGATIVE
Comment: NEGATIVE
Comment: NORMAL
Neisseria Gonorrhea: NEGATIVE
Trichomonas: NEGATIVE

## 2021-08-08 LAB — URINE CULTURE

## 2021-08-09 ENCOUNTER — Telehealth (HOSPITAL_COMMUNITY): Payer: Self-pay | Admitting: Emergency Medicine

## 2021-08-09 MED ORDER — METRONIDAZOLE 0.75 % VA GEL: 1.0000 | Freq: Every day | VAGINAL | 0 refills | Status: AC

## 2021-08-09 MED ORDER — FLUCONAZOLE 150 MG PO TABS: 150.0000 mg | ORAL_TABLET | Freq: Once | ORAL | 0 refills | Status: AC

## 2021-11-16 ENCOUNTER — Encounter: Payer: Self-pay | Admitting: Emergency Medicine

## 2021-11-16 ENCOUNTER — Ambulatory Visit
Admission: EM | Admit: 2021-11-16 | Discharge: 2021-11-16 | Disposition: A | Discharge status: Home / Self Care | Payer: Medicaid Other | Attending: Urgent Care | Admitting: Urgent Care

## 2021-11-16 DIAGNOSIS — M79644 Pain in right finger(s): Secondary | ICD-10-CM

## 2021-11-16 DIAGNOSIS — M7989 Other specified soft tissue disorders: Secondary | ICD-10-CM | POA: Diagnosis not present

## 2021-11-16 MED ORDER — NAPROXEN 375 MG PO TABS: 375.0000 mg | ORAL_TABLET | Freq: Two times a day (BID) | ORAL | 0 refills | Status: DC

## 2021-11-16 NOTE — ED Provider Notes (Signed)
[facility]   MRN: [id number] DOB: [birth year]  Subjective:   Jennifer Peters is a 31 y.o. female presenting for acute onset of right middle finger pain.  Symptoms started this morning.  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  She is right-hand dominant.  No history of gout.  No trauma, wounds, redness, hot sensation, bony deformity.  No history of musculoskeletal disorders.  No current facility-administered medications for this encounter.  Current Outpatient Medications:    naproxen (NAPROSYN) 375 MG tablet, Take 1 tablet (375 mg total) by mouth 2 (two) times daily with a meal., Disp: 30 tablet, Rfl: 0   acetaminophen (TYLENOL) 500 MG tablet, Take 500 mg by mouth every 6 (six) hours as needed for moderate pain., Disp: , Rfl:    ibuprofen (ADVIL) 600 MG tablet, Take 1 tablet (600 mg total) by mouth every 6 (six) hours as needed., Disp: 30 tablet, Rfl: 0   Allergies  Allergen Reactions   Latex Anaphylaxis, Itching and Rash    Past Medical History:  Diagnosis Date   Abnormal Pap smear of cervix    unknown stage of abnormality   Anemia    Hx of chlamydia infection    Low vitamin D level 05/10/2016   Post partum depression      Past Surgical History:  Procedure Laterality Date   CESAREAN SECTION N/A 09/25/2016   Procedure: CESAREAN SECTION;  Surgeon: Adam Phenix, MD;  Location: [facility]IRTHING SUIT[facility];  Service: Obstetrics;  Laterality: N/A;   DILATION AND CURETTAGE OF UTERUS     LAPAROSCOPIC APPENDECTOMY  06/14/2012   Procedure: APPENDECTOMY LAPAROSCOPIC;  Surgeon: Clovis Pu. Cornett, MD;  Location: [facility] OR;  Service: General;  Laterality: N/A;   wisdom tooth extracted x1 in 04/2011      Family History  Problem Relation Age of Onset   Kidney disease Maternal Grandmother    Heart disease Maternal Grandmother        chf   Diabetes Maternal Aunt     Hypertension Mother    Hypertension Father    Other Neg Hx     Social History   Tobacco Use   Smoking status: Former    Packs/day: 0.50    Years: 3.00    Total pack years: 1.50    Types: Cigarettes    Quit date: 08/21/2011    Years since quitting: 10.2   Smokeless tobacco: Never  Vaping Use   Vaping Use: Never used  Substance Use Topics   Alcohol use: No   Drug use: No    ROS   Objective:   Vitals: BP (!) 129/91   Pulse 71   Temp 98.2 F (36.8 C)   Resp 20   SpO2 98%   Physical Exam Constitutional:      General: She is not in acute distress.    Appearance: Normal appearance. She is well-developed. She is not ill-appearing, toxic-appearing or diaphoretic.  HENT:     Head: Normocephalic and atraumatic.     Nose: Nose normal.     Mouth/Throat:     Mouth: Mucous membranes are moist.  Eyes:     General: No scleral icterus.       Right eye: No discharge.        Left eye: No discharge.     Extraocular Movements: Extraocular movements intact.  Cardiovascular:  Rate and Rhythm: Normal rate.  Pulmonary:     Effort: Pulmonary effort is normal.  Musculoskeletal:       Hands:  Skin:    General: Skin is warm and dry.  Neurological:     General: No focal deficit present.     Mental Status: She is alert and oriented to person, place, and time.  Psychiatric:        Mood and Affect: Mood normal.        Behavior: Behavior normal.     Assessment and Plan :   PDMP not reviewed this encounter.  1. Finger pain, right   2. Finger swelling    Suspect inflammatory pain due to overuse from the nature of her work and yard work that she has been doing.  Recommended buddy tape system, naproxen for pain relief.  Deferred imaging given lack of trauma or physical exam findings warranting this. Counseled patient on potential for adverse effects with medications prescribed/recommended today, ER and return-to-clinic precautions discussed, patient verbalized  understanding.    Wallis Bamberg, PA-C 11/16/21 1402

## 2021-11-16 NOTE — ED Triage Notes (Signed)
Pt here with right ring finger swelling and some pain since this morning, but had hand swelling after mowing the lawn on Monday. That swelling went away and now just the finger is swollen.

## 2021-12-21 ENCOUNTER — Ambulatory Visit: Payer: Medicaid Other | Admitting: Obstetrics

## 2021-12-27 ENCOUNTER — Ambulatory Visit (INDEPENDENT_AMBULATORY_CARE_PROVIDER_SITE_OTHER): Payer: Medicaid Other | Admitting: Obstetrics and Gynecology

## 2021-12-27 ENCOUNTER — Other Ambulatory Visit (HOSPITAL_COMMUNITY)
Admission: RE | Admit: 2021-12-27 | Discharge: 2021-12-27 | Disposition: A | Discharge status: Home / Self Care | Payer: Medicaid Other | Source: Ambulatory Visit | Attending: Obstetrics | Admitting: Obstetrics

## 2021-12-27 ENCOUNTER — Encounter: Payer: Self-pay | Admitting: Obstetrics and Gynecology

## 2021-12-27 VITALS — BP 112/76 | HR 82 | Ht 62.0 in | Wt 136.1 lb

## 2021-12-27 DIAGNOSIS — Z30432 Encounter for removal of intrauterine contraceptive device: Secondary | ICD-10-CM | POA: Diagnosis not present

## 2021-12-27 DIAGNOSIS — Z01419 Encounter for gynecological examination (general) (routine) without abnormal findings: Secondary | ICD-10-CM | POA: Diagnosis not present

## 2021-12-27 DIAGNOSIS — Z30011 Encounter for initial prescription of contraceptive pills: Secondary | ICD-10-CM | POA: Diagnosis not present

## 2021-12-27 MED ORDER — SLYND 4 MG PO TABS: 1.0000 | ORAL_TABLET | Freq: Every day | ORAL | 12 refills | Status: DC

## 2021-12-27 NOTE — Progress Notes (Signed)
Patient presents for IUD removal. Patient does not want to continue contraception at this time. States that she will use condoms. Denies having any vaginal issues today. No other concerns.  Last pap: 05/02/2016 Normal

## 2021-12-27 NOTE — Progress Notes (Signed)
Subjective:     Jennifer Peters is a 31 y.o. female P2113 with LMP 12/24/21 and BMI 24 who is here for a comprehensive physical exam. The patient reports no problems. Patient reports a monthly 3 day period. She is sexually active using IUD which is due for removal. She plans to conceive in the next year or 2 and desires birth control pills. She denies pelvic pain or abnormal discharge. Patient is without any other complaints  Past Medical History:  Diagnosis Date   Abnormal Pap smear of cervix    unknown stage of abnormality   Anemia    Hx of chlamydia infection    Low vitamin D level 05/10/2016   Post partum depression    Past Surgical History:  Procedure Laterality Date   CESAREAN SECTION N/A 09/25/2016   Procedure: CESAREAN SECTION;  Surgeon: Adam Phenix, MD;  Location: [facility] BIRTHING SUITES;  Service: Obstetrics;  Laterality: N/A;   DILATION AND CURETTAGE OF UTERUS     LAPAROSCOPIC APPENDECTOMY  06/14/2012   Procedure: APPENDECTOMY LAPAROSCOPIC;  Surgeon: Clovis Pu. Cornett, MD;  Location: [facility] OR;  Service: General;  Laterality: N/A;   wisdom tooth extracted x1 in 04/2011     Family History  Problem Relation Age of Onset   Hypertension Father    Kidney disease Maternal Grandmother    Heart disease Maternal Grandmother        chf   Diabetes Maternal Aunt    Other Neg Hx     Social History   Socioeconomic History   Marital status: Single    Spouse name: [name]   Number of children: 3   Years of education: 12   Highest education level: Not on file  Occupational History   Occupation: Conservation officer, nature    Comment: [employer]   Occupation: FOB    Comment: unemployed  Tobacco Use   Smoking status: Former    Packs/day: 0.50    Years: 3.00    Total pack years: 1.50    Types: Cigarettes    Quit date: 08/21/2011    Years since quitting: 10.3   Smokeless tobacco: Never  Vaping Use   Vaping Use: Never used  Substance and Sexual Activity   Alcohol use: Yes    Comment: occ    Drug use: No   Sexual activity: Yes    Partners: Male    Birth control/protection: I.U.D.  Other Topics Concern   Not on file  Social History Narrative   Pt is single. FOB is age 25.    Social Determinants of Health   Financial Resource Strain: Not on file  Food Insecurity: Not on file  Transportation Needs: Not on file  Physical Activity: Not on file  Stress: Not on file  Social Connections: Not on file  Intimate Partner Violence: Not on file   Health Maintenance  Topic Date Due   Hepatitis C Screening  Never done   PAP SMEAR-Modifier  05/03/2019   COVID-19 Vaccine (4 - Pfizer series) 09/17/2020   INFLUENZA VACCINE  01/02/2022   TETANUS/TDAP  09/06/2026   HIV Screening  Completed   HPV VACCINES  Aged Out       Review of Systems Pertinent items noted in HPI and remainder of comprehensive ROS otherwise negative.   Objective:  Blood pressure 112/76, pulse 82, height 5\' 2"  (1.575 m), weight 136 lb 1.6 oz (61.7 kg), last menstrual period 12/24/2021, unknown if currently breastfeeding.   GENERAL: Well-developed, well-nourished female in no acute distress.  HEENT: Normocephalic, atraumatic. Sclerae anicteric.  NECK: Supple. Normal thyroid.  LUNGS: Clear to auscultation bilaterally.  HEART: Regular rate and rhythm. BREASTS: Symmetric in size. No palpable masses or lymphadenopathy, skin changes, or nipple drainage. ABDOMEN: Soft, nontender, nondistended. No organomegaly. PELVIC: Normal external female genitalia. Vagina is pink and rugated.  Normal discharge. Normal appearing cervix with IUD strings extending from os. Uterus is normal in size. No adnexal mass or tenderness. Chaperone present during the pelvic exam EXTREMITIES: No cyanosis, clubbing, or edema, 2+ distal pulses.     Assessment:    Healthy female exam.      Plan:    Pap smear collected Patient will be contacted with abnormal results Rx Slynd e-prescribed  GYNECOLOGY CLINIC PROCEDURE NOTE  IUD  Removal  Patient identified, informed consent performed, consent signed.  Patient was in the dorsal lithotomy position, normal external genitalia was noted.  A speculum was placed in the patient's vagina, normal discharge was noted, no lesions. The cervix was visualized, no lesions, no abnormal discharge.  The strings of the IUD were grasped and pulled using ring forceps. The IUD was removed in its entirety. Patient tolerated the procedure well.     See After Visit Summary for Counseling Recommendations

## 2021-12-29 DIAGNOSIS — Z01419 Encounter for gynecological examination (general) (routine) without abnormal findings: Secondary | ICD-10-CM | POA: Diagnosis present

## 2022-01-04 LAB — CYTOLOGY - PAP
Comment: NEGATIVE
Diagnosis: NEGATIVE
High risk HPV: NEGATIVE

## 2023-05-22 ENCOUNTER — Emergency Department (HOSPITAL_COMMUNITY): Payer: Medicaid Other

## 2023-05-22 ENCOUNTER — Encounter (HOSPITAL_COMMUNITY): Payer: Self-pay

## 2023-05-22 ENCOUNTER — Emergency Department (HOSPITAL_COMMUNITY)
Admission: EM | Admit: 2023-05-22 | Discharge: 2023-05-22 | Disposition: A | Discharge status: Home / Self Care | Payer: Medicaid Other | Attending: Emergency Medicine | Admitting: Emergency Medicine

## 2023-05-22 ENCOUNTER — Other Ambulatory Visit: Payer: Self-pay

## 2023-05-22 ENCOUNTER — Ambulatory Visit: Payer: Self-pay

## 2023-05-22 DIAGNOSIS — Z91040 Latex allergy status: Secondary | ICD-10-CM | POA: Insufficient documentation

## 2023-05-22 DIAGNOSIS — R0781 Pleurodynia: Secondary | ICD-10-CM | POA: Diagnosis present

## 2023-05-22 DIAGNOSIS — W1839XA Other fall on same level, initial encounter: Secondary | ICD-10-CM | POA: Diagnosis not present

## 2023-05-22 DIAGNOSIS — T148XXA Other injury of unspecified body region, initial encounter: Secondary | ICD-10-CM

## 2023-05-22 MED ORDER — IBUPROFEN 200 MG PO TABS
600.0000 mg | ORAL_TABLET | Freq: Once | ORAL | Status: AC
  Administered 2023-05-22: 600 mg via ORAL
  Filled 2023-05-22: qty 3

## 2023-05-22 MED ORDER — METHOCARBAMOL 500 MG PO TABS: 500.0000 mg | ORAL_TABLET | Freq: Two times a day (BID) | ORAL | 0 refills | Status: DC

## 2023-05-22 NOTE — ED Triage Notes (Signed)
Pt arrived reporting she fell on leg of a chair 2 days ago. R side rib pain. Denies any other injuries. Swelling subsided, pain has increased. No other symptoms

## 2023-05-22 NOTE — ED Notes (Signed)
Patient walked out before we could obtain vitals and give her discharge papers

## 2023-05-22 NOTE — Discharge Instructions (Signed)
Use Tylenol and or ibuprofen as directed.  Use of muscle relaxants as well.

## 2023-05-22 NOTE — ED Notes (Signed)
Patient walked out before we could obtain vitals and without discharge papers

## 2023-05-22 NOTE — ED Provider Notes (Signed)
[place] [facility]GENC[facility] DEPARTMENT AT [facility] Provider Note   CSN: [id number] Arrival date & time: 05/22/23  4098     History  Chief Complaint  Patient presents with   Flank Pain    Jennifer Peters is a 32 y.o. female.  32 year old female presents with right sided chest discomfort.  Occurred after she fell onto an object a few days ago.  Denies any hemoptysis.  Pain is characterized as sharp and pleuritic.  No increased dyspnea.  Unresponsive to home medications       Home Medications Prior to Admission medications   Medication Sig Start Date End Date Taking? Authorizing Provider  acetaminophen (TYLENOL) 500 MG tablet Take 500 mg by mouth every 6 (six) hours as needed for moderate pain.    [provider]  Drospirenone (SLYND) 4 MG TABS Take 1 tablet by mouth daily. 12/27/21   Constant, Peggy, MD  ibuprofen (ADVIL) 600 MG tablet Take 1 tablet (600 mg total) by mouth every 6 (six) hours as needed. 02/24/20   Fayrene Helper, PA-C  naproxen (NAPROSYN) 375 MG tablet Take 1 tablet (375 mg total) by mouth 2 (two) times daily with a meal. 11/16/21   Wallis Bamberg, PA-C      Allergies    Latex    Review of Systems   Review of Systems  All other systems reviewed and are negative.   Physical Exam Updated Vital Signs BP (!) 108/57 (BP Location: Left Arm)   Pulse 81   Temp 98.7 F (37.1 C) (Oral)   Resp 18   Ht 1.575 m (5\' 2" )   Wt 61.7 kg   LMP 05/05/2023   SpO2 98%   BMI 24.88 kg/m  Physical Exam Vitals and nursing note reviewed.  Constitutional:      General: She is not in acute distress.    Appearance: Normal appearance. She is well-developed. She is not toxic-appearing.  HENT:     Head: Normocephalic and atraumatic.  Eyes:     General: Lids are normal.     Conjunctiva/sclera: Conjunctivae normal.     Pupils: Pupils are equal, round, and reactive to light.  Neck:     Thyroid: No thyroid mass.     Trachea: No tracheal deviation.  Cardiovascular:      Rate and Rhythm: Normal rate and regular rhythm.     Heart sounds: Normal heart sounds. No murmur heard.    No gallop.  Pulmonary:     Effort: Pulmonary effort is normal. No respiratory distress.     Breath sounds: Normal breath sounds. No stridor. No decreased breath sounds, wheezing, rhonchi or rales.  Chest:     Chest wall: Tenderness present.    Abdominal:     General: There is no distension.     Palpations: Abdomen is soft.     Tenderness: There is no abdominal tenderness. There is no rebound.  Musculoskeletal:        General: No tenderness. Normal range of motion.       Arms:     Cervical back: Normal range of motion and neck supple.  Skin:    General: Skin is warm and dry.     Findings: No abrasion or rash.  Neurological:     Mental Status: She is alert and oriented to person, place, and time. Mental status is at baseline.     GCS: GCS eye subscore is 4. GCS verbal subscore is 5. GCS motor subscore is 6.     Cranial  Nerves: No cranial nerve deficit.     Sensory: No sensory deficit.     Motor: Motor function is intact.  Psychiatric:        Attention and Perception: Attention normal.        Speech: Speech normal.        Behavior: Behavior normal.     ED Results / Procedures / Treatments   Labs (all labs ordered are listed, but only abnormal results are displayed) Labs Reviewed - No data to display  EKG None  Radiology No results found.  Procedures Procedures    Medications Ordered in ED Medications  ibuprofen (ADVIL) tablet 600 mg (has no administration in time range)    ED Course/ Medical Decision Making/ A&P                                 Medical Decision Making Amount and/or Complexity of Data Reviewed Radiology: ordered.  Risk OTC drugs.   Right rib x-ray negative for fracture.  Suspect muscle bruise as cause of this.  Will discharge home        Final Clinical Impression(s) / ED Diagnoses Final diagnoses:  None    Rx / DC  Orders ED Discharge Orders     None         Lorre Nick, MD 05/22/23 1112

## 2023-06-02 ENCOUNTER — Emergency Department (HOSPITAL_COMMUNITY)
Admission: EM | Admit: 2023-06-02 | Discharge: 2023-06-02 | Discharge status: Against Medical Advice | Payer: Medicaid Other | Attending: Emergency Medicine | Admitting: Emergency Medicine

## 2023-06-02 DIAGNOSIS — Z20822 Contact with and (suspected) exposure to covid-19: Secondary | ICD-10-CM | POA: Diagnosis not present

## 2023-06-02 DIAGNOSIS — R059 Cough, unspecified: Secondary | ICD-10-CM | POA: Diagnosis present

## 2023-06-02 DIAGNOSIS — Z5321 Procedure and treatment not carried out due to patient leaving prior to being seen by health care provider: Secondary | ICD-10-CM | POA: Diagnosis not present

## 2023-06-02 DIAGNOSIS — R490 Dysphonia: Secondary | ICD-10-CM | POA: Diagnosis not present

## 2023-06-02 NOTE — ED Triage Notes (Signed)
Patient arrived with complaints of a productive cough and hoarseness over the last four days. Taking Cepacol and  Zicam with no relief.

## 2023-06-03 ENCOUNTER — Telehealth: Payer: Medicaid Other | Admitting: Physician Assistant

## 2023-06-03 ENCOUNTER — Telehealth: Payer: Medicaid Other

## 2023-06-03 DIAGNOSIS — B9689 Other specified bacterial agents as the cause of diseases classified elsewhere: Secondary | ICD-10-CM

## 2023-06-03 DIAGNOSIS — J069 Acute upper respiratory infection, unspecified: Secondary | ICD-10-CM | POA: Diagnosis not present

## 2023-06-03 LAB — SARS CORONAVIRUS 2 BY RT PCR: SARS Coronavirus 2 by RT PCR: NEGATIVE

## 2023-06-03 MED ORDER — PSEUDOEPH-BROMPHEN-DM 30-2-10 MG/5ML PO SYRP: 5.0000 mL | ORAL_SOLUTION | Freq: Four times a day (QID) | ORAL | 0 refills | Status: DC | PRN

## 2023-06-03 MED ORDER — PREDNISONE 20 MG PO TABS: 20.0000 mg | ORAL_TABLET | Freq: Every day | ORAL | 0 refills | Status: AC

## 2023-06-03 MED ORDER — AZITHROMYCIN 250 MG PO TABS: ORAL_TABLET | ORAL | 0 refills | Status: AC

## 2023-06-03 NOTE — Progress Notes (Signed)
Virtual Visit Consent   Jennifer Peters, you are scheduled for a virtual visit with a [facility] provider today. Just as with appointments in the office, your consent must be obtained to participate. Your consent will be active for this visit and any virtual visit you may have with one of our providers in the next 365 days. If you have a MyChart account, a copy of this consent can be sent to you electronically.  As this is a virtual visit, video technology does not allow for your provider to perform a traditional examination. This may limit your provider's ability to fully assess your condition. If your provider identifies any concerns that need to be evaluated in person or the need to arrange testing (such as labs, EKG, etc.), we will make arrangements to do so. Although advances in technology are sophisticated, we cannot ensure that it will always work on either your end or our end. If the connection with a video visit is poor, the visit may have to be switched to a telephone visit. With either a video or telephone visit, we are not always able to ensure that we have a secure connection.  By engaging in this virtual visit, you consent to the provision of healthcare and authorize for your insurance to be billed (if applicable) for the services provided during this visit. Depending on your insurance coverage, you may receive a charge related to this service.  I need to obtain your verbal consent now. Are you willing to proceed with your visit today? Jennifer Peters has provided verbal consent on 06/03/2023 for a virtual visit (video or telephone). Jennifer Loveless, PA-C  Date: 06/03/2023 2:15 PM  Virtual Visit via Video Note   I, Jennifer Peters, connected with  Jennifer Peters  ([id number], 12-03-1990) on 06/03/23 at  2:15 PM EST by a video-enabled telemedicine application and verified that I am speaking with the correct person using two identifiers.  Location: Patient: Virtual Visit Location  Patient: Home Provider: Virtual Visit Location Provider: Home Office   I discussed the limitations of evaluation and management by telemedicine and the availability of in person appointments. The patient expressed understanding and agreed to proceed.    History of Present Illness: Jennifer Peters is a 32 y.o. who identifies as a female who was assigned female at birth, and is being seen today for sore throat, hoarse voice and cough.  HPI: Sore Throat  This is a new problem. The current episode started 1 to 4 weeks ago. The problem has been gradually worsening. There has been no fever. The pain is moderate. Associated symptoms include congestion, coughing (productive of thick mucus), ear pain (right ear was muffled and painful for about a week but improving), headaches, a hoarse voice and vomiting (from coughing and thick mucus). Pertinent negatives include no abdominal pain, diarrhea, ear discharge, plugged ear sensation, shortness of breath, swollen glands or trouble swallowing. She has had no exposure to strep or mono. Treatments tried: zicam. The treatment provided no relief.   Seen in ER yesterday, 06/02/23, and had negative Covid testing; left before being seen due to wait time.  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    Allergies:  Allergies  Allergen Reactions   Latex Anaphylaxis, Itching and Rash   Medications:  Current Outpatient Medications:    acetaminophen (TYLENOL) 500 MG tablet, Take 500 mg by mouth every 6 (six) hours as  needed for moderate pain., Disp: , Rfl:    Drospirenone (SLYND) 4 MG TABS, Take 1 tablet by mouth daily., Disp: 28 tablet, Rfl: 12   ibuprofen (ADVIL) 600 MG tablet, Take 1 tablet (600 mg total) by mouth every 6 (six) hours as needed., Disp: 30 tablet, Rfl: 0   methocarbamol (ROBAXIN) 500 MG tablet, Take 1 tablet (500 mg total) by mouth 2 (two) times daily., Disp:  20 tablet, Rfl: 0   naproxen (NAPROSYN) 375 MG tablet, Take 1 tablet (375 mg total) by mouth 2 (two) times daily with a meal., Disp: 30 tablet, Rfl: 0  Observations/Objective: Patient is well-developed, well-nourished in no acute distress.  Resting comfortably at home.  Head is normocephalic, atraumatic.  No labored breathing.  Speech is clear and coherent with logical content.  Patient is alert and oriented at baseline.    Assessment and Plan: There are no diagnoses linked to this encounter. - Worsening over a week despite OTC medications - Will treat with Z-pack, Prednisone, and Bromfed DM - Can continue Mucinex (PLAIN) - Push fluids.  - Rest.  - Steam and humidifier can help - Seek in person evaluation if worsening or symptoms fail to improve    Follow Up Instructions: I discussed the assessment and treatment plan with the patient. The patient was provided an opportunity to ask questions and all were answered. The patient agreed with the plan and demonstrated an understanding of the instructions.  A copy of instructions were sent to the patient via MyChart unless otherwise noted below.    The patient was advised to call back or seek an in-person evaluation if the symptoms worsen or if the condition fails to improve as anticipated.    Jennifer Loveless, PA-C

## 2023-06-03 NOTE — Patient Instructions (Signed)
Marney Setting, thank you for joining Margaretann Loveless, PA-C for today's virtual visit.  While this provider is not your primary care provider (PCP), if your PCP is located in our provider database this encounter information will be shared with them immediately following your visit.   A [facility] MyChart account gives you access to today's visit and all your visits, tests, and labs performed at [facility] " click here if you don't have a [facility] MyChart account or go to [email or web address]  Consent: (Patient) Jennifer Peters provided verbal consent for this virtual visit at the beginning of the encounter.  Current Medications:  Current Outpatient Medications:    azithromycin (ZITHROMAX) 250 MG tablet, Take 2 tablets on day 1, then 1 tablet daily on days 2 through 5, Disp: 6 tablet, Rfl: 0   brompheniramine-pseudoephedrine-DM 30-2-10 MG/5ML syrup, Take 5 mLs by mouth 4 (four) times daily as needed., Disp: 120 mL, Rfl: 0   predniSONE (DELTASONE) 20 MG tablet, Take 1 tablet (20 mg total) by mouth daily with breakfast for 5 days., Disp: 5 tablet, Rfl: 0   acetaminophen (TYLENOL) 500 MG tablet, Take 500 mg by mouth every 6 (six) hours as needed for moderate pain., Disp: , Rfl:    Drospirenone (SLYND) 4 MG TABS, Take 1 tablet by mouth daily., Disp: 28 tablet, Rfl: 12   ibuprofen (ADVIL) 600 MG tablet, Take 1 tablet (600 mg total) by mouth every 6 (six) hours as needed., Disp: 30 tablet, Rfl: 0   methocarbamol (ROBAXIN) 500 MG tablet, Take 1 tablet (500 mg total) by mouth 2 (two) times daily., Disp: 20 tablet, Rfl: 0   naproxen (NAPROSYN) 375 MG tablet, Take 1 tablet (375 mg total) by mouth 2 (two) times daily with a meal., Disp: 30 tablet, Rfl: 0   Medications ordered in this encounter:  Meds ordered this encounter  Medications   azithromycin (ZITHROMAX) 250 MG tablet    Sig: Take 2 tablets on day 1, then 1 tablet daily on days 2 through 5    Dispense:  6 tablet    Refill:   0    Supervising Provider:   Merrilee Jansky [[id number]]   predniSONE (DELTASONE) 20 MG tablet    Sig: Take 1 tablet (20 mg total) by mouth daily with breakfast for 5 days.    Dispense:  5 tablet    Refill:  0    Supervising Provider:   Merrilee Jansky [[id number]]   brompheniramine-pseudoephedrine-DM 30-2-10 MG/5ML syrup    Sig: Take 5 mLs by mouth 4 (four) times daily as needed.    Dispense:  120 mL    Refill:  0    Supervising Provider:   Merrilee Jansky [[id number]]     *If you need refills on other medications prior to your next appointment, please contact your pharmacy*  Follow-Up: Call back or seek an in-person evaluation if the symptoms worsen or if the condition fails to improve as anticipated.  [facility] [phone]  Other Instructions  Upper Respiratory Infection, Adult An upper respiratory infection (URI) is a common viral infection of the nose, throat, and upper air passages that lead to the lungs. The most common type of URI is the common cold. URIs usually get better on their own, without medical treatment. What are the causes? A URI is caused by a virus. You may catch a virus by: Breathing in droplets from an infected person's cough or sneeze. Touching something that has been exposed to  the virus (is contaminated) and then touching your mouth, nose, or eyes. What increases the risk? You are more likely to get a URI if: You are very young or very old. You have close contact with others, such as at work, school, or a health care facility. You smoke. You have long-term (chronic) heart or lung disease. You have a weakened disease-fighting system (immune system). You have nasal allergies or asthma. You are experiencing a lot of stress. You have poor nutrition. What are the signs or symptoms? A URI usually involves some of the following symptoms: Runny or stuffy (congested) nose. Cough. Sneezing. Sore throat. Headache. Fatigue. Fever. Loss of  appetite. Pain in your forehead, behind your eyes, and over your cheekbones (sinus pain). Muscle aches. Redness or irritation of the eyes. Pressure in the ears or face. How is this diagnosed? This condition may be diagnosed based on your medical history and symptoms, and a physical exam. Your health care provider may use a swab to take a mucus sample from your nose (nasal swab). This sample can be tested to determine what virus is causing the illness. How is this treated? URIs usually get better on their own within 7-10 days. Medicines cannot cure URIs, but your health care provider may recommend certain medicines to help relieve symptoms, such as: Over-the-counter cold medicines. Cough suppressants. Coughing is a type of defense against infection that helps to clear the respiratory system, so take these medicines only as recommended by your health care provider. Fever-reducing medicines. Follow these instructions at home: Activity Rest as needed. If you have a fever, stay home from work or school until your fever is gone or until your health care provider says your URI cannot spread to other people (is no longer contagious). Your health care provider may have you wear a face mask to prevent your infection from spreading. Relieving symptoms Gargle with a mixture of salt and water 3-4 times a day or as needed. To make salt water, completely dissolve -1 tsp (3-6 g) of salt in 1 cup (237 mL) of warm water. Use a cool-mist humidifier to add moisture to the air. This can help you breathe more easily. Eating and drinking  Drink enough fluid to keep your urine pale yellow. Eat soups and other clear broths. General instructions  Take over-the-counter and prescription medicines only as told by your health care provider. These include cold medicines, fever reducers, and cough suppressants. Do not use any products that contain nicotine or tobacco. These products include cigarettes, chewing tobacco,  and vaping devices, such as e-cigarettes. If you need help quitting, ask your health care provider. Stay away from secondhand smoke. Stay up to date on all immunizations, including the yearly (annual) flu vaccine. Keep all follow-up visits. This is important. How to prevent the spread of infection to others URIs can be contagious. To prevent the infection from spreading: Wash your hands with soap and water for at least 20 seconds. If soap and water are not available, use hand sanitizer. Avoid touching your mouth, face, eyes, or nose. Cough or sneeze into a tissue or your sleeve or elbow instead of into your hand or into the air.  Contact a health care provider if: You are getting worse instead of better. You have a fever or chills. Your mucus is brown or red. You have yellow or brown discharge coming from your nose. You have pain in your face, especially when you bend forward. You have swollen neck glands. You have pain while  swallowing. You have white areas in the back of your throat. Get help right away if: You have shortness of breath that gets worse. You have severe or persistent: Headache. Ear pain. Sinus pain. Chest pain. You have chronic lung disease along with any of the following: Making high-pitched whistling sounds when you breathe, most often when you breathe out (wheezing). Prolonged cough (more than 14 days). Coughing up blood. A change in your usual mucus. You have a stiff neck. You have changes in your: Vision. Hearing. Thinking. Mood. These symptoms may be an emergency. Get help right away. Call 911. Do not wait to see if the symptoms will go away. Do not drive yourself to the hospital. Summary An upper respiratory infection (URI) is a common infection of the nose, throat, and upper air passages that lead to the lungs. A URI is caused by a virus. URIs usually get better on their own within 7-10 days. Medicines cannot cure URIs, but your health care  provider may recommend certain medicines to help relieve symptoms. This information is not intended to replace advice given to you by your health care provider. Make sure you discuss any questions you have with your health care provider. Document Revised: 12/21/2020 Document Reviewed: 12/21/2020 Elsevier Patient Education  2024 Elsevier Inc.    If you have been instructed to have an in-person evaluation today at a local Urgent Care facility, please use the link below. It will take you to a list of all of our available [facility], including address, phone number and hours of operation. Please do not delay care.  [facility]  If you or a family member do not have a primary care provider, use the link below to schedule a visit and establish care. When you choose a [facility] primary care physician or advanced practice provider, you gain a long-term partner in health. Find a Primary Care Provider  Learn more about [facility] in-office and virtual care options: [facility] - Get Care Now

## 2023-06-19 ENCOUNTER — Telehealth: Payer: Medicaid Other | Admitting: Nurse Practitioner

## 2023-06-19 DIAGNOSIS — J040 Acute laryngitis: Secondary | ICD-10-CM | POA: Diagnosis not present

## 2023-06-19 NOTE — Progress Notes (Signed)
 Virtual Visit Consent   Jennifer Peters, you are scheduled for a virtual visit with a [facility] provider today. Just as with appointments in the office, your consent must be obtained to participate. Your consent will be active for this visit and any virtual visit you may have with one of our providers in the next 365 days. If you have a MyChart account, a copy of this consent can be sent to you electronically.  As this is a virtual visit, video technology does not allow for your provider to perform a traditional examination. This may limit your provider's ability to fully assess your condition. If your provider identifies any concerns that need to be evaluated in person or the need to arrange testing (such as labs, EKG, etc.), we will make arrangements to do so. Although advances in technology are sophisticated, we cannot ensure that it will always work on either your end or our end. If the connection with a video visit is poor, the visit may have to be switched to a telephone visit. With either a video or telephone visit, we are not always able to ensure that we have a secure connection.  By engaging in this virtual visit, you consent to the provision of healthcare and authorize for your insurance to be billed (if applicable) for the services provided during this visit. Depending on your insurance coverage, you may receive a charge related to this service.  I need to obtain your verbal consent now. Are you willing to proceed with your visit today? Jennifer Peters has provided verbal consent on 06/19/2023 for a virtual visit (video or telephone). Mardene Shake, FNP  Date: 06/19/2023 5:17 PM  Virtual Visit via Video Note   I, Mardene Shake, connected with  Jennifer Peters  ([id number], [birth year]) on 06/19/23 at  5:15 PM EST by a video-enabled telemedicine application and verified that I am speaking with the correct person using two identifiers.  Location: Patient: Virtual Visit Location Patient:  Home Provider: Virtual Visit Location Provider: Home Office   I discussed the limitations of evaluation and management by telemedicine and the availability of in person appointments. The patient expressed understanding and agreed to proceed.    History of Present Illness:  Jennifer Peters is a 33 y.o. who identifies as a female who was assigned female at birth, and is being seen today for ongoing loss of voice   She was seen on 06/03/23 for URI symptoms and was treated with Azithromycin , prednisone  and Bromphed   She denies feeling sick anymore, might have some PND no cough or runny nose  ST resolved   Denies any tenderness to cervical lymph nodes  She has not been able to rest her voice    Yesterday she was playing tennis with her kids she feels she dislocated her right shoulder and she manually put it back into place  She is tender today but has full ROM  She can hold it to 90 degrees without drop    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    Allergies:  Allergies  Allergen Reactions   Latex Anaphylaxis, Itching and Rash   Medications:  Current Outpatient Medications:    acetaminophen  (TYLENOL ) 500 MG tablet, Take 500 mg by mouth every 6 (six) hours as needed for moderate pain., Disp: , Rfl:    brompheniramine-pseudoephedrine -DM 30-2-10 MG/5ML syrup, Take 5 mLs by mouth 4 (four) times daily as needed.,  Disp: 120 mL, Rfl: 0   Drospirenone  (SLYND ) 4 MG TABS, Take 1 tablet by mouth daily., Disp: 28 tablet, Rfl: 12   ibuprofen  (ADVIL ) 600 MG tablet, Take 1 tablet (600 mg total) by mouth every 6 (six) hours as needed., Disp: 30 tablet, Rfl: 0   methocarbamol  (ROBAXIN ) 500 MG tablet, Take 1 tablet (500 mg total) by mouth 2 (two) times daily., Disp: 20 tablet, Rfl: 0   naproxen  (NAPROSYN ) 375 MG tablet, Take 1 tablet (375 mg total) by mouth 2 (two) times daily with a meal., Disp: 30  tablet, Rfl: 0  Observations/Objective: Patient is well-developed, well-nourished in no acute distress.  Resting comfortably  at home.  Head is normocephalic, atraumatic.  No labored breathing.  Speech is clear and coherent with logical content.  Patient is alert and oriented at baseline.  Voice is present but weak  Assessment and Plan:  1. Laryngitis (Primary)  Advised hydration  Vocal rest  Placing humidifier in bedroom  Throat coat tea  Throat lozenges for comfort    Honey for comfort   If symptoms persist she will need to follow up with PCP for ENT referral   Follow Up Instructions: I discussed the assessment and treatment plan with the patient. The patient was provided an opportunity to ask questions and all were answered. The patient agreed with the plan and demonstrated an understanding of the instructions.  A copy of instructions were sent to the patient via MyChart unless otherwise noted below.    The patient was advised to call back or seek an in-person evaluation if the symptoms worsen or if the condition fails to improve as anticipated.    Mardene Shake, FNP

## 2023-06-27 ENCOUNTER — Other Ambulatory Visit: Payer: Self-pay

## 2023-06-27 ENCOUNTER — Ambulatory Visit
Admission: EM | Admit: 2023-06-27 | Discharge: 2023-06-27 | Disposition: A | Discharge status: Home / Self Care | Payer: Medicaid Other | Attending: Family Medicine | Admitting: Family Medicine

## 2023-06-27 DIAGNOSIS — R197 Diarrhea, unspecified: Secondary | ICD-10-CM | POA: Diagnosis not present

## 2023-06-27 DIAGNOSIS — R112 Nausea with vomiting, unspecified: Secondary | ICD-10-CM

## 2023-06-27 DIAGNOSIS — A084 Viral intestinal infection, unspecified: Secondary | ICD-10-CM | POA: Diagnosis not present

## 2023-06-27 LAB — POCT URINALYSIS DIP (MANUAL ENTRY)
Bilirubin, UA: NEGATIVE
Blood, UA: NEGATIVE
Glucose, UA: NEGATIVE mg/dL
Ketones, POC UA: NEGATIVE mg/dL
Leukocytes, UA: NEGATIVE
Nitrite, UA: NEGATIVE
Protein Ur, POC: NEGATIVE mg/dL
Spec Grav, UA: 1.02
Urobilinogen, UA: 2 U/dL — AB
pH, UA: 7

## 2023-06-27 LAB — POCT URINE PREGNANCY: Preg Test, Ur: NEGATIVE

## 2023-06-27 MED ORDER — LOPERAMIDE HCL 2 MG PO CAPS: 2.0000 mg | ORAL_CAPSULE | Freq: Two times a day (BID) | ORAL | 0 refills | Status: DC | PRN

## 2023-06-27 MED ORDER — ONDANSETRON 8 MG PO TBDP: 8.0000 mg | ORAL_TABLET | Freq: Three times a day (TID) | ORAL | 0 refills | Status: DC | PRN

## 2023-06-27 NOTE — Discharge Instructions (Signed)
Make sure you push fluids drinking mostly water but mix it with Gatorade.  Try to eat light meals including soups, broths and soft foods, fruits.  You may use Zofran for your nausea and vomiting once every 8 hours.  Imodium can help with diarrhea but use this carefully limiting it to 1-2 times per day only if you are having a lot of diarrhea.  Please return to the clinic if symptoms worsen or you start having severe abdominal pain not helped by taking Tylenol or start having bloody stools or blood in the vomit.  Try to avoid using NSAIDs.

## 2023-06-27 NOTE — ED Provider Notes (Signed)
[facility] - [facility]RG[facility]  Note:  This document was prepared using Conservation officer, historic buildings and may include unintentional dictation errors.  MRN: [id number] DOB: Oct 13, 1990  Subjective:   Jennifer Peters is a 33 y.o. female presenting for 1 day history of acute onset persistent nausea, vomiting, diarrhea, stomach cramping.  No fever, bloody stools, recent antibiotic use, hospitalizations or long distance travel.  Has not eaten raw foods, drank unfiltered water.  No history of GI disorders including Crohn's, IBS, ulcerative colitis.   No current facility-administered medications for this encounter.  Current Outpatient Medications:    acetaminophen (TYLENOL) 500 MG tablet, Take 500 mg by mouth every 6 (six) hours as needed for moderate pain., Disp: , Rfl:    brompheniramine-pseudoephedrine-DM 30-2-10 MG/5ML syrup, Take 5 mLs by mouth 4 (four) times daily as needed., Disp: 120 mL, Rfl: 0   Drospirenone (SLYND) 4 MG TABS, Take 1 tablet by mouth daily., Disp: 28 tablet, Rfl: 12   ibuprofen (ADVIL) 600 MG tablet, Take 1 tablet (600 mg total) by mouth every 6 (six) hours as needed., Disp: 30 tablet, Rfl: 0   methocarbamol (ROBAXIN) 500 MG tablet, Take 1 tablet (500 mg total) by mouth 2 (two) times daily., Disp: 20 tablet, Rfl: 0   naproxen (NAPROSYN) 375 MG tablet, Take 1 tablet (375 mg total) by mouth 2 (two) times daily with a meal., Disp: 30 tablet, Rfl: 0   Allergies  Allergen Reactions   Latex Anaphylaxis, Itching and Rash    Past Medical History:  Diagnosis Date   Abnormal Pap smear of cervix    unknown stage of abnormality   Anemia    Hx of chlamydia infection    Low vitamin D level 05/10/2016   Post partum depression      Past Surgical History:  Procedure Laterality Date   APPENDECTOMY     CESAREAN SECTION N/A 09/25/2016   Procedure: CESAREAN SECTION;  Surgeon: Adam Phenix, MD;  Location: [facility] BIRTHING SU[facility];  Service: Obstetrics;  Laterality: N/A;    DILATION AND CURETTAGE OF UTERUS     LAPAROSCOPIC APPENDECTOMY  06/14/2012   Procedure: APPENDECTOMY LAPAROSCOPIC;  Surgeon: Clovis Pu. Cornett, MD;  Location: [facility] OR;  Service: General;  Laterality: N/A;   wisdom tooth extracted x1 in 04/2011      Family History  Problem Relation Age of Onset   Hypertension Father    Kidney disease Maternal Grandmother    Heart disease Maternal Grandmother        chf   Diabetes Maternal Aunt    Other Neg Hx     Social History   Tobacco Use   Smoking status: Former    Current packs/day: 0.00    Average packs/day: 0.5 packs/day for 3.0 years (1.5 ttl pk-yrs)    Types: Cigarettes    Start date: 08/20/2008    Quit date: 08/21/2011    Years since quitting: 11.8   Smokeless tobacco: Never  Vaping Use   Vaping status: Never Used  Substance Use Topics   Alcohol use: Yes    Comment: occ   Drug use: No    ROS   Objective:   Vitals: BP 113/76 (BP Location: Left Arm)   Pulse 69   Temp 97.9 F (36.6 C) (Oral)   Resp 16   LMP 05/26/2023 (Exact Date)   SpO2 98%   Physical Exam Constitutional:      General: She is not in acute distress.    Appearance: Normal appearance. She is  well-developed. She is not ill-appearing, toxic-appearing or diaphoretic.  HENT:     Head: Normocephalic and atraumatic.     Nose: Nose normal.     Mouth/Throat:     Mouth: Mucous membranes are moist.     Pharynx: Oropharynx is clear.  Eyes:     General: No scleral icterus.       Right eye: No discharge.        Left eye: No discharge.     Extraocular Movements: Extraocular movements intact.     Conjunctiva/sclera: Conjunctivae normal.  Cardiovascular:     Rate and Rhythm: Normal rate.  Pulmonary:     Effort: Pulmonary effort is normal.  Abdominal:     General: Bowel sounds are normal. There is no distension.     Palpations: Abdomen is soft. There is no mass.     Tenderness: There is generalized abdominal tenderness and tenderness in the epigastric area.  There is no right CVA tenderness, left CVA tenderness, guarding or rebound.  Skin:    General: Skin is warm and dry.  Neurological:     General: No focal deficit present.     Mental Status: She is alert and oriented to person, place, and time.  Psychiatric:        Mood and Affect: Mood normal.        Behavior: Behavior normal.        Thought Content: Thought content normal.        Judgment: Judgment normal.     Results for orders placed or performed during the hospital encounter of 06/27/23 (from the past 24 hours)  POCT urine pregnancy     Status: Normal   Collection Time: 06/27/23  1:04 PM  Result Value Ref Range   Preg Test, Ur Negative   POCT urinalysis dipstick     Status: Abnormal   Collection Time: 06/27/23  1:05 PM  Result Value Ref Range   Color, UA yellow    Clarity, UA clear    Glucose, UA negative mg/dL   Bilirubin, UA negative    Ketones, POC UA negative mg/dL   Spec Grav, UA 1.610    Blood, UA negative    pH, UA 7.0    Protein Ur, POC negative mg/dL   Urobilinogen, UA 2.0 (A) E.U./dL   Nitrite, UA Negative    Leukocytes, UA Negative     Assessment and Plan :   PDMP not reviewed this encounter.  1. Viral gastroenteritis   2. Nausea vomiting and diarrhea     No signs of an acute abdomen.  Will manage for suspected viral gastroenteritis with supportive care.  Recommended patient hydrate well, eat light meals and maintain electrolytes.  Will use Zofran and Imodium for nausea, vomiting and diarrhea. Counseled patient on potential for adverse effects with medications prescribed/recommended today, ER and return-to-clinic precautions discussed, patient verbalized understanding.    Wallis Bamberg, [place] 06/27/23 1321

## 2023-06-27 NOTE — ED Triage Notes (Signed)
Patient states she has been having vomiting and diarrhea.Patient states she has loose diarrhea many times today. Patient states she started with a fever today. Patient took motrin this morning at 1045 am for fever.

## 2023-07-21 ENCOUNTER — Emergency Department (HOSPITAL_COMMUNITY)
Admission: EM | Admit: 2023-07-21 | Discharge: 2023-07-21 | Discharge status: Against Medical Advice | Payer: Medicaid Other | Attending: Emergency Medicine | Admitting: Emergency Medicine

## 2023-07-21 ENCOUNTER — Other Ambulatory Visit: Payer: Self-pay

## 2023-07-21 ENCOUNTER — Encounter (HOSPITAL_COMMUNITY): Payer: Self-pay | Admitting: *Deleted

## 2023-07-21 DIAGNOSIS — Z91040 Latex allergy status: Secondary | ICD-10-CM | POA: Insufficient documentation

## 2023-07-21 DIAGNOSIS — L292 Pruritus vulvae: Secondary | ICD-10-CM | POA: Insufficient documentation

## 2023-07-21 DIAGNOSIS — Z711 Person with feared health complaint in whom no diagnosis is made: Secondary | ICD-10-CM | POA: Diagnosis not present

## 2023-07-21 LAB — URINALYSIS, ROUTINE W REFLEX MICROSCOPIC
Bilirubin Urine: NEGATIVE
Glucose, UA: NEGATIVE mg/dL
Hgb urine dipstick: NEGATIVE
Ketones, ur: 20 mg/dL — AB
Leukocytes,Ua: NEGATIVE
Nitrite: NEGATIVE
Protein, ur: NEGATIVE mg/dL
Specific Gravity, Urine: 1.024 (ref 1.005–1.030)
pH: 5 (ref 5.0–8.0)

## 2023-07-21 LAB — WET PREP, GENITAL
Clue Cells Wet Prep HPF POC: NONE SEEN
Sperm: NONE SEEN
Trich, Wet Prep: NONE SEEN
WBC, Wet Prep HPF POC: <10 (ref <10)
Yeast Wet Prep HPF POC: NONE SEEN

## 2023-07-21 LAB — PREGNANCY, URINE: Preg Test, Ur: NEGATIVE

## 2023-07-21 MED ORDER — DOXYCYCLINE HYCLATE 100 MG PO CAPS: 100.0000 mg | ORAL_CAPSULE | Freq: Two times a day (BID) | ORAL | 0 refills | Status: DC

## 2023-07-21 MED ORDER — ONDANSETRON 4 MG PO TBDP: ORAL_TABLET | ORAL | 0 refills | Status: DC

## 2023-07-21 MED ORDER — LIDOCAINE HCL (PF) 1 % IJ SOLN
INTRAMUSCULAR | Status: AC
  Administered 2023-07-21: 2.1 mL
  Filled 2023-07-21: qty 30

## 2023-07-21 MED ORDER — DOXYCYCLINE HYCLATE 100 MG PO TABS
100.0000 mg | ORAL_TABLET | Freq: Once | ORAL | Status: AC
  Administered 2023-07-21: 100 mg via ORAL
  Filled 2023-07-21: qty 1

## 2023-07-21 MED ORDER — ONDANSETRON 4 MG PO TBDP
4.0000 mg | ORAL_TABLET | Freq: Once | ORAL | Status: DC
  Filled 2023-07-21: qty 1

## 2023-07-21 MED ORDER — CEFTRIAXONE SODIUM 1 G IJ SOLR
500.0000 mg | Freq: Once | INTRAMUSCULAR | Status: AC
  Administered 2023-07-21: 500 mg via INTRAMUSCULAR
  Filled 2023-07-21: qty 10

## 2023-07-21 NOTE — Discharge Instructions (Signed)
Your urinalysis and wet prep are normal today  You should be able to see the results of your STD testing on MyChart in 1 to 2 days  Please take doxycycline until your chlamydia results come back negative  I have prescribed Zofran for nausea  See your doctor for follow-up  Return to ER if you have worse discharge

## 2023-07-21 NOTE — ED Notes (Signed)
Went to bring pt nausea meds but pt was not in room

## 2023-07-21 NOTE — ED Triage Notes (Signed)
Here by POV from home for vaginal itching. Concerned about STD. Denies odor, d/c, fever, abd or back pain, lesions or rash. Alert, NAD, calm, interactive.

## 2023-07-21 NOTE — ED Provider Notes (Signed)
[facility] EMERGENCY DEPARTMENT AT [facility] Provider Note   CSN: [id number] Arrival date & time: 07/21/23  1739     History  Chief Complaint  Patient presents with   Vaginal Itching    Jennifer Peters is a 33 y.o. female here presenting with vaginal itchiness.  Patient states that her boyfriend left her.  She is concerned that she may have gotten STDs from him.  She states that she has some vaginal itchiness but denies any discharge.  She also states that her urine is dark.  She wants to be tested and treated for STD.  The history is provided by the patient.       Home Medications Prior to Admission medications   Medication Sig Start Date End Date Taking? Authorizing Provider  doxycycline (VIBRAMYCIN) 100 MG capsule Take 1 capsule (100 mg total) by mouth 2 (two) times daily. 07/21/23  Yes Jennifer Pander, MD  acetaminophen (TYLENOL) 500 MG tablet Take 500 mg by mouth every 6 (six) hours as needed for moderate pain.    [provider]  brompheniramine-pseudoephedrine-DM 30-2-10 MG/5ML syrup Take 5 mLs by mouth 4 (four) times daily as needed. 06/03/23   Margaretann Loveless, PA-C  Drospirenone (SLYND) 4 MG TABS Take 1 tablet by mouth daily. 12/27/21   Constant, Peggy, MD  ibuprofen (ADVIL) 600 MG tablet Take 1 tablet (600 mg total) by mouth every 6 (six) hours as needed. 02/24/20   Fayrene Helper, PA-C  loperamide (IMODIUM) 2 MG capsule Take 1 capsule (2 mg total) by mouth 2 (two) times daily as needed for diarrhea or loose stools. 06/27/23   Wallis Bamberg, PA-C  methocarbamol (ROBAXIN) 500 MG tablet Take 1 tablet (500 mg total) by mouth 2 (two) times daily. 05/22/23   Lorre Nick, MD  naproxen (NAPROSYN) 375 MG tablet Take 1 tablet (375 mg total) by mouth 2 (two) times daily with a meal. 11/16/21   Wallis Bamberg, PA-C  ondansetron (ZOFRAN-ODT) 8 MG disintegrating tablet Take 1 tablet (8 mg total) by mouth every 8 (eight) hours as needed for nausea or vomiting. 06/27/23    Wallis Bamberg, PA-C      Allergies    Latex    Review of Systems   Review of Systems  Genitourinary:  Positive for vaginal pain.  All other systems reviewed and are negative.   Physical Exam Updated Vital Signs BP 129/86 (BP Location: Right Arm)   Pulse 74   Temp 98.2 F (36.8 C) (Oral)   Resp 16   Wt 59.9 kg   LMP 07/15/2023 (Exact Date)   SpO2 100%   BMI 24.14 kg/m  Physical Exam Vitals and nursing note reviewed.  HENT:     Head: Normocephalic.     Nose: Nose normal.     Mouth/Throat:     Mouth: Mucous membranes are moist.  Eyes:     Extraocular Movements: Extraocular movements intact.     Pupils: Pupils are equal, round, and reactive to light.  Cardiovascular:     Rate and Rhythm: Normal rate.     Pulses: Normal pulses.  Pulmonary:     Effort: Pulmonary effort is normal.  Genitourinary:    Comments: Deferred Musculoskeletal:     Cervical back: Normal range of motion.  Skin:    General: Skin is warm.     Capillary Refill: Capillary refill takes less than 2 seconds.  Neurological:     General: No focal deficit present.     Mental Status: She  is alert.  Psychiatric:        Mood and Affect: Mood normal.     ED Results / Procedures / Treatments   Labs (all labs ordered are listed, but only abnormal results are displayed) Labs Reviewed  URINALYSIS, ROUTINE W REFLEX MICROSCOPIC - Abnormal; Notable for the following components:      Result Value   Ketones, ur 20 (*)    All other components within normal limits  WET PREP, GENITAL  PREGNANCY, URINE  GC/CHLAMYDIA PROBE AMP ([place]) NOT AT [facility]  GC/CHLAMYDIA PROBE AMP ([place]) NOT AT [facility]    EKG None  Radiology No results found.  Procedures Procedures    Medications Ordered in ED Medications  ondansetron (ZOFRAN-ODT) disintegrating tablet 4 mg (has no administration in time range)  cefTRIAXone (ROCEPHIN) injection 500 mg (500 mg Intramuscular Given 07/21/23 2052)  doxycycline  (VIBRA-TABS) tablet 100 mg (100 mg Oral Given 07/21/23 2052)  lidocaine (PF) (XYLOCAINE) 1 % injection (2.1 mLs  Given 07/21/23 2058)    ED Course/ Medical Decision Making/ A&P                                 Medical Decision Making [name] is a 33 y.o. female here with concern for STD.  Patient has some vaginal itchiness.  Patient perform self swab and wet prep is negative.  UA did not show any obvious infection.  Patient was empirically treated for STDs with Rocephin and doxycycline.  Told her that results will be available on MyChart   Problems Addressed: Concern about STD in female without diagnosis: acute illness or injury  Amount and/or Complexity of Data Reviewed Labs: ordered. Decision-making details documented in ED Course.  Risk Prescription drug management.    Final Clinical Impression(s) / ED Diagnoses Final diagnoses:  None    Rx / DC Orders ED Discharge Orders          Ordered    doxycycline (VIBRAMYCIN) 100 MG capsule  2 times daily        07/21/23 2044              Jennifer Pander, MD 07/21/23 2219

## 2023-07-22 ENCOUNTER — Ambulatory Visit: Payer: Self-pay | Admitting: Registered Nurse

## 2023-07-22 ENCOUNTER — Ambulatory Visit: Payer: Medicaid Other

## 2023-07-22 LAB — GC/CHLAMYDIA PROBE AMP (~~LOC~~) NOT AT ARMC
Chlamydia: NEGATIVE
Comment: NEGATIVE
Comment: NORMAL
Neisseria Gonorrhea: NEGATIVE

## 2023-07-22 NOTE — Telephone Encounter (Signed)
Copied from CRM [id number]. Topic: Clinical - Red Word Triage >> Jul 22, 2023  2:40 PM Elle L wrote: Red Word that prompted transfer to Nurse Triage: The patient was in the Emergency Room yesterday and does not understand the clinical notes. She requested to speak to a nurse to help her understand her results. She states that she was unable to reach someone at the hospital. Answer Assessment - Initial Assessment Questions 1. REASON FOR CALL or QUESTION: "What is your reason for calling today?" or "How can I best help you?" or "What question do you have that I can help answer?"     Patient wanted to review results of labs from ED visit and does not have PCP. This RN was able to review the results and doctor order with patient. No further questions at this time.  Protocols used: Information Only Call - No Triage-A-AH

## 2023-07-30 ENCOUNTER — Ambulatory Visit: Payer: Medicaid Other | Admitting: Family

## 2023-09-09 ENCOUNTER — Telehealth

## 2023-09-09 ENCOUNTER — Telehealth: Admitting: Nurse Practitioner

## 2023-09-09 DIAGNOSIS — R3989 Other symptoms and signs involving the genitourinary system: Secondary | ICD-10-CM | POA: Diagnosis not present

## 2023-09-09 MED ORDER — CEPHALEXIN 500 MG PO CAPS: 500.0000 mg | ORAL_CAPSULE | Freq: Two times a day (BID) | ORAL | 0 refills | Status: AC

## 2023-09-09 NOTE — Progress Notes (Signed)
 Virtual Visit Consent   Jennifer Peters, you are scheduled for a virtual visit with a [facility] provider today. Just as with appointments in the office, your consent must be obtained to participate. Your consent will be active for this visit and any virtual visit you may have with one of our providers in the next 365 days. If you have a MyChart account, a copy of this consent can be sent to you electronically.  As this is a virtual visit, video technology does not allow for your provider to perform a traditional examination. This may limit your provider's ability to fully assess your condition. If your provider identifies any concerns that need to be evaluated in person or the need to arrange testing (such as labs, EKG, etc.), we will make arrangements to do so. Although advances in technology are sophisticated, we cannot ensure that it will always work on either your end or our end. If the connection with a video visit is poor, the visit may have to be switched to a telephone visit. With either a video or telephone visit, we are not always able to ensure that we have a secure connection.  By engaging in this virtual visit, you consent to the provision of healthcare and authorize for your insurance to be billed (if applicable) for the services provided during this visit. Depending on your insurance coverage, you may receive a charge related to this service.  I need to obtain your verbal consent now. Are you willing to proceed with your visit today? Jennifer Peters has provided verbal consent on 09/09/2023 for a virtual visit (video or telephone). Viviano Simas, FNP  Date: 09/09/2023 5:36 PM   Virtual Visit via Video Note   I, Viviano Simas, connected with  Jennifer Peters  ([id number], Apr 09, 1991) on 09/09/23 at  5:45 PM EDT by a video-enabled telemedicine application and verified that I am speaking with the correct person using two identifiers.  Location: Patient: Virtual Visit Location Patient:  Home Provider: Virtual Visit Location Provider: Home Office   I discussed the limitations of evaluation and management by telemedicine and the availability of in person appointments. The patient expressed understanding and agreed to proceed.    History of Present Illness: Jennifer Peters is a 33 y.o. who identifies as a female who was assigned female at birth, and is being seen today for urinary urgency and only has a small amount of urine to come out when she does urinate. 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    Allergies:  Allergies  Allergen Reactions   Latex Anaphylaxis, Itching and Rash   Medications:  Current Outpatient Medications:    acetaminophen (TYLENOL) 500 MG tablet, Take 500 mg by mouth every 6 (six) hours as needed for moderate pain., Disp: , Rfl:    brompheniramine-pseudoephedrine-DM 30-2-10 MG/5ML syrup, Take 5 mLs by mouth 4 (four) times daily as needed., Disp: 120 mL, Rfl: 0   doxycycline (VIBRAMYCIN) 100 MG capsule, Take 1 capsule (100 mg total) by mouth 2 (two) times daily., Disp: 14 capsule, Rfl: 0   Drospirenone (SLYND) 4 MG TABS, Take 1 tablet by mouth daily.,  Disp: 28 tablet, Rfl: 12   ibuprofen (ADVIL) 600 MG tablet, Take 1 tablet (600 mg total) by mouth every 6 (six) hours as needed., Disp: 30 tablet, Rfl: 0   loperamide (IMODIUM) 2 MG capsule, Take 1 capsule (2 mg total) by mouth 2 (two) times daily as needed for diarrhea or loose stools., Disp: 14 capsule, Rfl: 0   methocarbamol (ROBAXIN) 500 MG tablet, Take 1 tablet (500 mg total) by mouth 2 (two) times daily., Disp: 20 tablet, Rfl: 0   naproxen (NAPROSYN) 375 MG tablet, Take 1 tablet  (375 mg total) by mouth 2 (two) times daily with a meal., Disp: 30 tablet, Rfl: 0   ondansetron (ZOFRAN-ODT) 4 MG disintegrating tablet, 4mg  ODT q4 hours prn nausea/vomit, Disp: 4 tablet, Rfl: 0  Observations/Objective: Patient is well-developed, well-nourished in no acute distress.  Resting comfortably  at home.  Head is normocephalic, atraumatic.  No labored breathing.  Speech is clear and coherent with logical content.  Patient is alert and oriented at baseline.    Assessment and Plan:   1. Suspected UTI  Push fluids avoid alcohol/ sugar and caffeine   Meds ordered this encounter  Medications   cephALEXin (KEFLEX) 500 MG capsule    Sig: Take 1 capsule (500 mg total) by mouth 2 (two) times daily for 7 days.    Dispense:  14 capsule    Refill:  0     Follow Up Instructions: I discussed the assessment and treatment plan with the patient. The patient was provided an opportunity to ask questions and all were answered. The patient agreed with the plan and demonstrated an understanding of the instructions.  A copy of instructions were sent to the patient via MyChart unless otherwise noted below.    The patient was advised to call back or seek an in-person evaluation if the symptoms worsen or if the condition fails to improve as anticipated.    Viviano Simas, FNP

## 2023-10-14 ENCOUNTER — Encounter: Payer: Medicaid Other | Admitting: Family

## 2023-10-14 NOTE — Progress Notes (Signed)
 Erroneous encounter-disregard

## 2024-01-08 ENCOUNTER — Telehealth

## 2024-01-08 ENCOUNTER — Telehealth: Admitting: Family Medicine

## 2024-01-08 ENCOUNTER — Ambulatory Visit

## 2024-01-08 DIAGNOSIS — K625 Hemorrhage of anus and rectum: Secondary | ICD-10-CM

## 2024-01-08 NOTE — Progress Notes (Signed)
 Jennifer Peters   Having rectal pain and bleeding that is new onset. No history of hemorrhoids in the past or rectal bleeding. Needs in person eval and assessment.  Patient acknowledged agreement and understanding of the plan.

## 2024-01-10 ENCOUNTER — Encounter (HOSPITAL_COMMUNITY): Payer: Self-pay | Admitting: *Deleted

## 2024-01-10 ENCOUNTER — Ambulatory Visit

## 2024-01-10 ENCOUNTER — Other Ambulatory Visit: Payer: Self-pay

## 2024-01-10 ENCOUNTER — Ambulatory Visit (HOSPITAL_COMMUNITY)
Admission: EM | Admit: 2024-01-10 | Discharge: 2024-01-10 | Disposition: A | Discharge status: Home / Self Care | Attending: Family Medicine | Admitting: Family Medicine

## 2024-01-10 DIAGNOSIS — K625 Hemorrhage of anus and rectum: Secondary | ICD-10-CM | POA: Diagnosis present

## 2024-01-10 DIAGNOSIS — K644 Residual hemorrhoidal skin tags: Secondary | ICD-10-CM | POA: Insufficient documentation

## 2024-01-10 DIAGNOSIS — R1030 Lower abdominal pain, unspecified: Secondary | ICD-10-CM | POA: Diagnosis present

## 2024-01-10 LAB — COMPREHENSIVE METABOLIC PANEL WITH GFR
ALT: 14 U/L (ref 0–44)
AST: 19 U/L (ref 15–41)
Albumin: 4.2 g/dL (ref 3.5–5.0)
Alkaline Phosphatase: 35 U/L — ABNORMAL LOW (ref 38–126)
Anion gap: 14 (ref 5–15)
BUN: 6 mg/dL (ref 6–20)
CO2: 23 mmol/L (ref 22–32)
Calcium: 9 mg/dL (ref 8.9–10.3)
Chloride: 106 mmol/L (ref 98–111)
Creatinine, Ser: 0.81 mg/dL (ref 0.44–1.00)
GFR, Estimated: >60 mL/min (ref >60)
Glucose, Bld: 63 mg/dL — ABNORMAL LOW (ref 70–99)
Potassium: 3.7 mmol/L (ref 3.5–5.1)
Sodium: 143 mmol/L (ref 135–145)
Total Bilirubin: 0.5 mg/dL (ref 0.0–1.2)
Total Protein: 6.7 g/dL (ref 6.5–8.1)

## 2024-01-10 LAB — CBC
HCT: 36.7 % (ref 36.0–46.0)
Hemoglobin: 12 g/dL (ref 12.0–15.0)
MCH: 34.7 pg — ABNORMAL HIGH (ref 26.0–34.0)
MCHC: 32.7 g/dL (ref 30.0–36.0)
MCV: 106.1 fL — ABNORMAL HIGH (ref 80.0–100.0)
Platelets: 301 K/uL (ref 150–400)
RBC: 3.46 MIL/uL — ABNORMAL LOW (ref 3.87–5.11)
RDW: 11.5 % (ref 11.5–15.5)
WBC: 5.9 K/uL (ref 4.0–10.5)
nRBC: 0 % (ref 0.0–0.2)

## 2024-01-10 MED ORDER — DICYCLOMINE HCL 20 MG PO TABS: 20.0000 mg | ORAL_TABLET | Freq: Four times a day (QID) | ORAL | 0 refills | Status: AC | PRN

## 2024-01-10 MED ORDER — HYDROCORTISONE (PERIANAL) 2.5 % EX CREA: 1.0000 | TOPICAL_CREAM | Freq: Two times a day (BID) | CUTANEOUS | 0 refills | Status: DC

## 2024-01-10 NOTE — ED Provider Notes (Signed)
 [facility]    CSN: [id number] Arrival date & time: 01/10/24  1000      History   Chief Complaint Chief Complaint  Patient presents with   Rectal Bleeding    HPI Jennifer Peters is a 33 y.o. female.    Rectal Bleeding Here for rectal bleeding that she is noted for about 4 months.  Initially it was intermittent and just a little bit.  In the last month or so she has noted more persistent bleeding when she wipes and sometimes she will note rectal bleeding into her underwear.  She is confident that this is different from her vaginal bleeding when she has a period  She has noted rectal pain and can feel maybe a hemorrhoid in the last 7 days. No fever or chills  She has had some intermittent abdominal cramping  She started using a stool softener and that has made her stools soft no current diarrhea or constipation.  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 Cornett, MD;  Location: [facility];  Service: General;  Laterality: N/A;   wisdom tooth extracted x1 in 04/2011      OB History     Gravida  4   Para  3   Term  2   Preterm  1   AB  1   Living  3      SAB  1   IAB  0   Ectopic  0   Multiple   0   Live Births  3            Home Medications    Prior to Admission medications   Medication Sig Start Date End Date Taking? Authorizing Provider  dicyclomine  (BENTYL ) 20 MG tablet Take 1 tablet (20 mg total) by mouth 4 (four) times daily as needed (intestinal cramps). 01/10/24  Yes Jonatha Gagen K, MD  hydrocortisone  (ANUSOL -HC) 2.5 % rectal cream Place 1 Application rectally 2 (two) times daily. 01/10/24  Yes Glorie Dowlen K, MD  Multiple Vitamin (MULTIVITAMIN PO) Take by mouth.   Yes [provider]  UNKNOWN TO PATIENT Stool softener   Yes [provider]  acetaminophen  (TYLENOL ) 500 MG tablet Take 500 mg by mouth every 6 (six) hours as needed for moderate pain.    [provider]  Drospirenone  (SLYND ) 4 MG TABS Take 1 tablet by mouth daily. 12/27/21   Constant, Peggy, MD    Family History Family History  Problem Relation Age of Onset   Hypertension Father    Kidney disease Maternal Grandmother    Heart disease Maternal Grandmother        chf  Diabetes Maternal Aunt    Other Neg Hx     Social History Social History   Tobacco Use   Smoking status: Former    Current packs/day: 0.00    Average packs/day: 0.5 packs/day for 3.0 years (1.5 ttl pk-yrs)    Types: Cigarettes    Start date: 08/20/2008    Quit date: 08/21/2011    Years since quitting: 12.3   Smokeless tobacco: Never  Vaping Use   Vaping status: Never Used  Substance Use Topics   Alcohol use: Yes    Comment: occasionally   Drug use: No     Allergies   Latex   Review of Systems Review of Systems  Gastrointestinal:  Positive for hematochezia.     Physical Exam Triage Vital Signs ED Triage Vitals  Encounter Vitals Group     BP 01/10/24 1029 (!) 133/90     Girls Systolic BP Percentile --      Girls Diastolic BP Percentile --      Boys Systolic BP Percentile --      Boys Diastolic BP Percentile --      Pulse Rate 01/10/24 1029 78     Resp 01/10/24 1029 16      Temp 01/10/24 1029 98.2 F (36.8 C)     Temp Source 01/10/24 1029 Oral     SpO2 01/10/24 1029 98 %     Weight --      Height --      Head Circumference --      Peak Flow --      Pain Score 01/10/24 1031 7     Pain Loc --      Pain Education --      Exclude from Growth Chart --    No data found.  Updated Vital Signs BP (!) 133/90   Pulse 78   Temp 98.2 F (36.8 C) (Oral)   Resp 16   LMP 12/29/2023 (Exact Date)   SpO2 98%   Breastfeeding No   Visual Acuity Right Eye Distance:   Left Eye Distance:   Bilateral Distance:    Right Eye Near:   Left Eye Near:    Bilateral Near:     Physical Exam Vitals reviewed.  Constitutional:      General: She is not in acute distress.    Appearance: She is not ill-appearing, toxic-appearing or diaphoretic.  HENT:     Mouth/Throat:     Mouth: Mucous membranes are moist.     Pharynx: No oropharyngeal exudate or posterior oropharyngeal erythema.  Eyes:     Extraocular Movements: Extraocular movements intact.     Conjunctiva/sclera: Conjunctivae normal.     Pupils: Pupils are equal, round, and reactive to light.  Cardiovascular:     Rate and Rhythm: Normal rate and regular rhythm.     Heart sounds: No murmur heard. Pulmonary:     Effort: Pulmonary effort is normal. No respiratory distress.     Breath sounds: No stridor. No wheezing, rhonchi or rales.  Abdominal:     General: Bowel sounds are normal. There is no distension.     Palpations: Abdomen is soft.     Tenderness: There is no abdominal tenderness. There is no guarding.  Genitourinary:    Comments: Chaperone is present during the time of exam.  There is a firm swelling of the left aspect of the rectum that is consistent with a hemorrhoid.  It is mildly tender.  There is no induration or erythema or fluctuance  that would be consistent with any infectious process.  No fissure Musculoskeletal:     Cervical back: Neck supple.  Lymphadenopathy:     Cervical: No cervical  adenopathy.  Skin:    Capillary Refill: Capillary refill takes less than 2 seconds.     Coloration: Skin is not jaundiced or pale.  Neurological:     General: No focal deficit present.     Mental Status: She is alert and oriented to person, place, and time.  Psychiatric:        Behavior: Behavior normal.      UC Treatments / Results  Labs (all labs ordered are listed, but only abnormal results are displayed) Labs Reviewed  CBC  COMPREHENSIVE METABOLIC PANEL WITH GFR    EKG   Radiology No results found.  Procedures Procedures (including critical care time)  Medications Ordered in UC Medications - No data to display  Initial Impression / Assessment and Plan / UC Course  I have reviewed the triage vital signs and the nursing notes.  Pertinent labs & imaging results that were available during my care of the patient were reviewed by me and considered in my medical decision making (see chart for details).     Anusol  cream is sent in to treat the hemorrhoid.  She is already on stool softeners.  CBC and CMP are drawn and we will notify her if anything is significantly abnormal.  She is set up for primary care in about a month.  I have asked her to go to the emergency room if she worsens in any way.  Final Clinical Impressions(s) / UC Diagnoses   Final diagnoses:  Rectal bleeding  External hemorrhoid  Lower abdominal pain     Discharge Instructions      We have drawn blood to check your blood counts and electrolytes and kidney and liver function.  Staff will notify you if anything is significantly abnormal  Apply Anusol  HC/hydrocortisone  cream to the hemorrhoid area twice daily until better.  Dicyclomine --take 1 every 6 hours as needed for intestinal cramps  Please follow-up with primary care about this issue  If you worsen in any way, please go to the emergency room for further evaluation    ED Prescriptions     Medication Sig Dispense Auth. Provider    hydrocortisone  (ANUSOL -HC) 2.5 % rectal cream Place 1 Application rectally 2 (two) times daily. 30 g Vonna Sharlet POUR, MD   dicyclomine  (BENTYL ) 20 MG tablet Take 1 tablet (20 mg total) by mouth 4 (four) times daily as needed (intestinal cramps). 20 tablet Rheanna Sergent K, MD      PDMP not reviewed this encounter.   Vonna Sharlet POUR, MD 01/10/24 [phone]

## 2024-01-10 NOTE — Discharge Instructions (Signed)
 We have drawn blood to check your blood counts and electrolytes and kidney and liver function.  Staff will notify you if anything is significantly abnormal  Apply Anusol  HC/hydrocortisone  cream to the hemorrhoid area twice daily until better.  Dicyclomine --take 1 every 6 hours as needed for intestinal cramps  Please follow-up with primary care about this issue  If you worsen in any way, please go to the emergency room for further evaluation

## 2024-01-10 NOTE — ED Triage Notes (Addendum)
 C/O intermittent bright red blood in stool, onset in April; states blood in stool is now becoming more frequent and usual. States was initially not having any pain, but now c/o constant rectal pain x6 days. Reports no known hx of hemorrhoids. Also concerned bc she is now having a decreased appetite.  States had virtual visit yesterday and was told she should have an in-person exam.  Has been using stool softener and applying lido spray.

## 2024-01-13 ENCOUNTER — Ambulatory Visit (HOSPITAL_COMMUNITY): Payer: Self-pay

## 2024-01-24 ENCOUNTER — Ambulatory Visit: Payer: Self-pay

## 2024-01-24 NOTE — Telephone Encounter (Signed)
 FYI Only or Action Required?: FYI only for provider.  Patient was last seen in primary care on 01/08/2024 by Moishe Chiquita HERO, NP.  Called Nurse Triage reporting Rectal Bleeding.  Symptoms began several months ago.  Interventions attempted: Nothing.  Symptoms are: gradually worsening.  Triage Disposition: Go to ED Now (or PCP Triage)  Patient/caregiver understands and will follow disposition?: Yes   Copied from CRM #[id number]. Topic: Clinical - Red Word Triage >> Jan 24, 2024  2:44 PM Tinnie C wrote: Red Word that prompted transfer to Nurse Triage: Pt called in to look into sooner new patient appointments (currently scheduled at [facility] for 9/24). She has had bad colon issues since beginning of month (blood in stool,cramping, nausea, fatigue, dizzy), consistant bleeding in stool since feb. Went to urgent care august 8th and got abnormal bloodwork but was just needing to fu with pcp. Reason for Disposition  Patient sounds very sick or weak to the triager  Answer Assessment - Initial Assessment Questions 1. APPEARANCE of BLOOD: What color is it? Is it passed separately, on the surface of the stool, or mixed in with the stool?      Mixed in with stool  2. AMOUNT: How much blood was passed?      Mixed in the stool 3. FREQUENCY: How many times has blood been passed with the stools?      Every stool  4. ONSET: When was the blood first seen in the stools? (Days or weeks)      End of April- beginning of may was intermittently, but July began more frequently with every bowel movement 5. DIARRHEA: Is there also some diarrhea? If Yes, ask: How many diarrhea stools in the past 24 hours?      no 6. CONSTIPATION: Do you have constipation? If Yes, ask: How bad is it?     Yes, only have a BM once a day but having to take stool softners 7. RECURRENT SYMPTOMS: Have you had blood in your stools before? If Yes, ask: When was the last time? and What happened that time?      Yes  had been recurring since April  8. BLOOD THINNERS: Do you take any blood thinners? (e.g., aspirin , clopidogrel / Plavix, coumadin, heparin). Notes: Other strong blood thinners include: Arixtra (fondaparinux), Eliquis (apixaban), Pradaxa (dabigatran), and Xarelto (rivaroxaban).     no 9. OTHER SYMPTOMS: Do you have any other symptoms?  (e.g., abdomen pain, vomiting, dizziness, fever)     Nausea, fatigue, dizziness 10. PREGNANCY: Is there any chance you are pregnant? When was your last menstrual period?       no  Protocols used: Rectal Bleeding-A-AH

## 2024-02-26 ENCOUNTER — Ambulatory Visit

## 2024-05-19 ENCOUNTER — Encounter: Payer: Self-pay | Admitting: Gastroenterology

## 2024-05-25 ENCOUNTER — Other Ambulatory Visit

## 2024-05-25 ENCOUNTER — Ambulatory Visit: Admitting: Gastroenterology

## 2024-05-25 ENCOUNTER — Encounter: Payer: Self-pay | Admitting: Gastroenterology

## 2024-05-25 VITALS — BP 118/70 | HR 88 | Ht 62.0 in | Wt 133.4 lb

## 2024-05-25 DIAGNOSIS — R1084 Generalized abdominal pain: Secondary | ICD-10-CM | POA: Diagnosis not present

## 2024-05-25 DIAGNOSIS — D509 Iron deficiency anemia, unspecified: Secondary | ICD-10-CM

## 2024-05-25 DIAGNOSIS — K625 Hemorrhage of anus and rectum: Secondary | ICD-10-CM

## 2024-05-25 DIAGNOSIS — K648 Other hemorrhoids: Secondary | ICD-10-CM

## 2024-05-25 DIAGNOSIS — K5909 Other constipation: Secondary | ICD-10-CM

## 2024-05-25 DIAGNOSIS — R630 Anorexia: Secondary | ICD-10-CM

## 2024-05-25 DIAGNOSIS — K921 Melena: Secondary | ICD-10-CM | POA: Diagnosis not present

## 2024-05-25 LAB — CBC WITH DIFFERENTIAL/PLATELET
Basophils Absolute: 0.1 K/uL (ref 0.0–0.1)
Basophils Relative: 0.9 % (ref 0.0–3.0)
Eosinophils Absolute: 0.2 K/uL (ref 0.0–0.7)
Eosinophils Relative: 3.1 % (ref 0.0–5.0)
HCT: 36.5 % (ref 36.0–46.0)
Hemoglobin: 12.4 g/dL (ref 12.0–15.0)
Lymphocytes Relative: 45.9 % (ref 12.0–46.0)
Lymphs Abs: 2.9 K/uL (ref 0.7–4.0)
MCHC: 33.8 g/dL (ref 30.0–36.0)
MCV: 104.6 fl — ABNORMAL HIGH (ref 78.0–100.0)
Monocytes Absolute: 0.7 K/uL (ref 0.1–1.0)
Monocytes Relative: 11.2 % (ref 3.0–12.0)
Neutro Abs: 2.4 K/uL (ref 1.4–7.7)
Neutrophils Relative %: 38.9 % — ABNORMAL LOW (ref 43.0–77.0)
Platelets: 308 K/uL (ref 150.0–400.0)
RBC: 3.49 Mil/uL — ABNORMAL LOW (ref 3.87–5.11)
RDW: 12.2 % (ref 11.5–15.5)
WBC: 6.3 K/uL (ref 4.0–10.5)

## 2024-05-25 LAB — IBC + FERRITIN
Ferritin: 112.6 ng/mL (ref 10.0–291.0)
Iron: 109 ug/dL (ref 42–145)
Saturation Ratios: 33.7 % (ref 20.0–50.0)
TIBC: 323.4 ug/dL (ref 250.0–450.0)
Transferrin: 231 mg/dL (ref 212.0–360.0)

## 2024-05-25 LAB — B12 AND FOLATE PANEL
Folate: 8.7 ng/mL
Vitamin B-12: 344 pg/mL (ref 211–911)

## 2024-05-25 MED ORDER — NA SULFATE-K SULFATE-MG SULF 17.5-3.13-1.6 GM/177ML PO SOLN: 1.0000 | Freq: Once | ORAL | 0 refills | Status: DC

## 2024-05-25 NOTE — Progress Notes (Signed)
 "  Chief Complaint:Hemorrhage of anus and rectum  Primary GI Doctor: Dr. Federico  HPI:  Patient is a  33  year old female patient with past medical history of anemia, who was referred to me by Stroud, Natalie M, FNP on 02/27/24 for a evaluation of hemorrhage of anus and rectum.  01/10/24 seen in ED for rectal bleeding. Upon PE It is noted rectal hemorrhoid and Anusol  cream prescribed. Labs show: hgb 12, MCV 106.1, MCH 34.7  Interval History Patient presents for evaluation of BRBPR and dark maroon stools mixed in her stools.  BRBPR, maroon stools Internal Hemorrhoids She reports she has BM's mixed with blood intermittently since April-Jamira Barfuss 2025. She notes few episodes last year as well. She was treated for external hemorrhoids in August with topical ointment which helped with the BRBPR but not the darker maroon stools.  Her last BM with blood was last week. She has had issue with constipation and has BM every 3 days. She is currently just using stool softeners. She reports lower abdominal cramping described as gas pains.  No rectal pain.  Reports poor appetite Denies nausea or vomiting.    History of iron deficiency anemia,  reports she has been on iron supplements intermittently since her last pregnancy in 2013 She is currently taking iron 1 tablet po daily Not vegan. Does not donate blood. Reports she has normal menstrual cycle 3 days. No family history of blood disorders.   She drinks occasionally. Former smoker, stopped 13 years ago.  Never had EGD/colon.  Surgical history: appendectomy   Patient's family history includes: none  Wt Readings from Last 3 Encounters:  05/25/24 133 lb 6 oz (60.5 kg)  07/21/23 132 lb (59.9 kg)  05/22/23 136 lb 0.4 oz (61.7 kg)    Past Medical History:  Diagnosis Date   Abnormal Pap smear of cervix    unknown stage of abnormality   Anemia    Hx of chlamydia infection    Low vitamin D  level 05/10/2016   Post partum depression     Past  Surgical History:  Procedure Laterality Date   APPENDECTOMY     CESAREAN SECTION N/A 09/25/2016   Procedure: CESAREAN SECTION;  Surgeon: Lynwood KANDICE Solomons, MD;  Location: [facility];  Service: Obstetrics;  Laterality: N/A;   DILATION AND CURETTAGE OF UTERUS     LAPAROSCOPIC APPENDECTOMY  06/14/2012   Procedure: APPENDECTOMY LAPAROSCOPIC;  Surgeon: Debby LABOR. Cornett, MD;  Location: [facility];  Service: General;  Laterality: N/A;   wisdom tooth extracted x1 in 04/2011      Current Outpatient Medications  Medication Sig Dispense Refill   hydrocortisone  (ANUSOL -HC) 2.5 % rectal cream Place 1 Application rectally 2 (two) times daily. 30 g 1   Multiple Vitamin (MULTIVITAMIN PO) Take by mouth.     Na Sulfate-K Sulfate-Mg Sulfate concentrate (SUPREP) 17.5-3.13-1.6 GM/177ML SOLN Take 1 kit (354 mLs total) by mouth once for 1 dose. 354 mL 0   UNKNOWN TO PATIENT Stool softener     acetaminophen  (TYLENOL ) 500 MG tablet Take 500 mg by mouth every 6 (six) hours as needed for moderate pain.     dicyclomine  (BENTYL ) 20 MG tablet Take 1 tablet (20 mg total) by mouth 4 (four) times daily as needed (intestinal cramps). 20 tablet 0   Drospirenone  (SLYND ) 4 MG TABS Take 1 tablet by mouth daily. 28 tablet 12   No current facility-administered medications for this visit.    Allergies as of 05/25/2024 - Review Complete 05/25/2024  Allergen Reaction Noted   Latex Anaphylaxis, Itching, and Rash 02/18/2015    Family History  Problem Relation Age of Onset   Hypertension Father    Kidney disease Maternal Grandmother    Heart disease Maternal Grandmother        chf   Diabetes Maternal Aunt    Other Neg Hx     Review of Systems:    Constitutional: No weight loss, fever, chills, weakness or fatigue HEENT: Eyes: No change in vision               Ears, Nose, Throat:  No change in hearing or congestion Skin: No rash or itching Cardiovascular: No chest pain, chest pressure or palpitations   Respiratory: No  SOB or cough Gastrointestinal: See HPI and otherwise negative Genitourinary: No dysuria or change in urinary frequency Neurological: No headache, dizziness or syncope Musculoskeletal: No new muscle or joint pain Hematologic: No bleeding or bruising Psychiatric: No history of depression or anxiety    Physical Exam:  Vital signs: BP 118/70   Pulse 88   Ht 5' 2 (1.575 m)   Wt 133 lb 6 oz (60.5 kg)   BMI 24.39 kg/m   Constitutional:   Pleasant  female appears to be in NAD, Well developed, Well nourished, alert and cooperative Eyes:   PEERL, EOMI. No icterus. Conjunctiva pink. Neck:  Supple Throat: Oral cavity and pharynx without inflammation, swelling or lesion.  Respiratory: Respirations even and unlabored. Lungs clear to auscultation bilaterally.   No wheezes, crackles, or rhonchi.  Cardiovascular: Normal S1, S2. Regular rate and rhythm. No peripheral edema, cyanosis or pallor.  Gastrointestinal:  Soft, nondistended, nontender. No rebound or guarding. Normal bowel sounds. No appreciable masses or hepatomegaly. Rectal: Normal external rectal exam, normal rectal tone, appreciated internal hemorrhoids, non-tender, no masses, , brown stool, hemoccult N/A Chaperone Denise Anoscopy:internal hemorrhoids  Msk:  Symmetrical without gross deformities. Without edema, no deformity or joint abnormality.  Neurologic:  Alert and  oriented x4;  grossly normal neurologically.  Skin:   Dry and intact without significant lesions or rashes.  RELEVANT LABS AND IMAGING: CBC    Latest Ref Rng & Units 05/25/2024    2:42 PM 01/10/2024   11:12 AM 07/27/2020    5:15 PM  CBC  WBC 4.0 - 10.5 K/uL 6.3  5.9  6.7   Hemoglobin 12.0 - 15.0 g/dL 87.5  87.9  88.6   Hematocrit 36.0 - 46.0 % 36.5  36.7  34.4   Platelets 150.0 - 400.0 K/uL 308.0  301       CMP     Latest Ref Rng & Units 01/10/2024   11:12 AM 07/27/2020    5:15 PM 11/06/2019    9:53 AM  CMP  Glucose 70 - 99 mg/dL 63  80  84   BUN 6 - 20 mg/dL 6   13  13    Creatinine 0.44 - 1.00 mg/dL 9.18  9.23  9.17   Sodium 135 - 145 mmol/L 143  142  137   Potassium 3.5 - 5.1 mmol/L 3.7  3.6  4.0   Chloride 98 - 111 mmol/L 106  105  102   CO2 22 - 32 mmol/L 23  22  23    Calcium  8.9 - 10.3 mg/dL 9.0  8.9  9.0   Total Protein 6.5 - 8.1 g/dL 6.7  6.2  6.7   Total Bilirubin 0.0 - 1.2 mg/dL 0.5  0.6  0.6   Alkaline Phos 38 - 126 U/L  35  44  56   AST 15 - 41 U/L 19  12  23    ALT 0 - 44 U/L 14  11  36      Lab Results  Component Value Date   TSH 0.258 (L) 07/27/2020     Assessment: Encounter Diagnoses  Name Primary?   Iron deficiency anemia, unspecified iron deficiency anemia type    Melena    BRBPR (bright red blood per rectum)    Internal hemorrhoids Yes   Poor appetite    Generalized abdominal pain    Chronic constipation   33 year old female patient that presents with complaint of bright red blood per rectum as well as maroon dark stools intermittently for the past year or more.  Patient was treated for internal hemorrhoids with Anusol  topical which relieved the bleeding associated with the hemorrhoids however patient has continued to have maroon-colored stools intermittently.  Patient reports history of iron deficiency anemia and has been on oral iron.  Elevated MCH, MCV. Hgb 12.  Will go ahead and check anemia panel.  Also reports poor appetite. Weight stable. Will also go ahead and schedule upper GI endoscopy and colonoscopy to fully evaluate iron deficiency anemia and reported blood in stools. Did not get fecal occult while on iron.  Evaluated the patient incidental internal hemorrhoid, small. Otherwise normal exam. No fissure.  Educated on high-fiber diet along with adding over-the-counter MiraLAX p.o. daily for constipation.  Plan: -iron panel/TIBC, b 12 , folate -continue iron supplement 1 tab po daily  - Recommend high fiber diet -OTC Miralax po daily  -Anusol  topical BID script sent prn -Schedule Egd in [facility] with Dr. Federico The  risks and benefits of EGD with possible biopsies and esophageal dilation were discussed with the patient who agrees to proceed. -Schedule for a colonoscopy in [facility] with Dr. Sandi. The risks and benefits of colonoscopy with possible polypectomy / biopsies were discussed and the patient agrees to proceed.   Thank you for the courtesy of this consult. Please call me with any questions or concerns.   Guillermo Difrancesco, FNP-C [facility] 05/25/2024, 4:02 PM  Cc: Stroud, Natalie M, FNP  "

## 2024-05-25 NOTE — Patient Instructions (Addendum)
 Constipation Recommend high fiber diet Drink plenty of water Start OTC Miralax po daily Can purchase squatty potty to help with bowel movements  Hemorrhoids Anusol  apply pea size amt two times daily  Prescription sent   We have sent the following medications to your pharmacy for you to pick up at your convenience:  SUPREP Anusol  Hydrocortisone  Cream  Your provider has requested that you go to the basement level for lab work before leaving today. Press B on the elevator. The lab is located at the first door on the left as you exit the elevator.  You have been scheduled for an endoscopy and colonoscopy. Please follow the written instructions given to you at your visit today.  If you use inhalers (even only as needed), please bring them with you on the day of your procedure.  DO NOT TAKE 7 DAYS PRIOR TO TEST- Trulicity (dulaglutide) Ozempic, Wegovy (semaglutide) Mounjaro, Zepbound (tirzepatide) Bydureon Bcise (exanatide extended release)  DO NOT TAKE 1 DAY PRIOR TO YOUR TEST Rybelsus (semaglutide) Adlyxin (lixisenatide) Victoza (liraglutide) Byetta (exanatide) ___________________________________________________________________________ Due to recent changes in healthcare laws, you may see the results of your imaging and laboratory studies on MyChart before your provider has had a chance to review them.  We understand that in some cases there may be results that are confusing or concerning to you. Not all laboratory results come back in the same time frame and the provider may be waiting for multiple results in order to interpret others.  Please give us  48 hours in order for your provider to thoroughly review all the results before contacting the office for clarification of your results.   _______________________________________________________  If your blood pressure at your visit was 140/90 or greater, please contact your primary care physician to follow up on  this.  _______________________________________________________  If you are age 32 or older, your body mass index should be between 23-30. Your Body mass index is 24.39 kg/m. If this is out of the aforementioned range listed, please consider follow up with your Primary Care Provider.  If you are age 72 or younger, your body mass index should be between 19-25. Your Body mass index is 24.39 kg/m. If this is out of the aformentioned range listed, please consider follow up with your Primary Care Provider.   ________________________________________________________  The [facility] providers would like to encourage you to use MYCHART to communicate with providers for non-urgent requests or questions.  Due to long hold times on the telephone, sending your provider a message by [facility] may be a faster and more efficient way to get a response.  Please allow 48 business hours for a response.  Please remember that this is for non-urgent requests.  _______________________________________________________  [facility] is using a team-based approach to care.  Your team is made up of your doctor and two to three APPS. Our APPS (Nurse Practitioners and Physician Assistants) work with your physician to ensure care continuity for you. They are fully qualified to address your health concerns and develop a treatment plan. They communicate directly with your gastroenterologist to care for you. Seeing the Advanced Practice Practitioners on your physician's team can help you by facilitating care more promptly, often allowing for earlier appointments, access to diagnostic testing, procedures, and other specialty referrals.   Thank you for trusting me with your gastrointestinal care. Deanna May, FNP-C

## 2024-05-26 ENCOUNTER — Ambulatory Visit: Payer: Self-pay | Admitting: Gastroenterology

## 2024-06-12 ENCOUNTER — Other Ambulatory Visit: Payer: Self-pay | Admitting: Gastroenterology

## 2024-06-16 ENCOUNTER — Ambulatory Visit: Admitting: Internal Medicine

## 2024-06-16 ENCOUNTER — Encounter: Payer: Self-pay | Admitting: Internal Medicine

## 2024-06-16 VITALS — BP 115/80 | HR 77 | Temp 97.9°F | Resp 14 | Ht 62.0 in | Wt 133.6 lb

## 2024-06-16 DIAGNOSIS — K648 Other hemorrhoids: Secondary | ICD-10-CM

## 2024-06-16 DIAGNOSIS — D509 Iron deficiency anemia, unspecified: Secondary | ICD-10-CM

## 2024-06-16 DIAGNOSIS — K2950 Unspecified chronic gastritis without bleeding: Secondary | ICD-10-CM | POA: Diagnosis not present

## 2024-06-16 DIAGNOSIS — K625 Hemorrhage of anus and rectum: Secondary | ICD-10-CM

## 2024-06-16 MED ORDER — SODIUM CHLORIDE 0.9 % IV SOLN: 500.0000 mL | INTRAVENOUS | Status: DC

## 2024-06-16 MED ORDER — PANTOPRAZOLE SODIUM 20 MG PO TBEC: 20.0000 mg | DELAYED_RELEASE_TABLET | Freq: Every day | ORAL | 0 refills | Status: AC

## 2024-06-16 NOTE — Op Note (Signed)
 [facility] Patient Name: Jennifer Peters Procedure Date: 06/16/2024 1:07 PM MRN: [id number] Endoscopist: Rosario Estefana Kidney , , [id number] Age: 34 Referring MD:  Date of Birth: Sep 20, 1990 Gender: Female Account #: [id number] Procedure:                Colonoscopy Indications:              Hematochezia, Iron deficiency anemia Medicines:                Monitored Anesthesia Care Procedure:                Pre-Anesthesia Assessment:                           - Prior to the procedure, a History and Physical                            was performed, and patient medications and                            allergies were reviewed. The patient's tolerance of                            previous anesthesia was also reviewed. The risks                            and benefits of the procedure and the sedation                            options and risks were discussed with the patient.                            All questions were answered, and informed consent                            was obtained. Prior Anticoagulants: The patient has                            taken no anticoagulant or antiplatelet agents. ASA                            Grade Assessment: I - A normal, healthy patient.                            After reviewing the risks and benefits, the patient                            was deemed in satisfactory condition to undergo the                            procedure.                           After obtaining informed consent, the colonoscope  was passed under direct vision. Throughout the                            procedure, the patient's blood pressure, pulse, and                            oxygen saturations were monitored continuously. The                            CF [id number] #[id number] was introduced through the anus                            and advanced to the the terminal ileum. The                            colonoscopy was performed without  difficulty. The                            patient tolerated the procedure well. The quality                            of the bowel preparation was good. The terminal                            ileum, ileocecal valve, appendiceal orifice, and                            rectum were photographed. Scope In: 1:32:33 PM Scope Out: 1:46:20 PM Scope Withdrawal Time: 0 hours 9 minutes 21 seconds  Total Procedure Duration: 0 hours 13 minutes 47 seconds  Findings:                 The terminal ileum appeared normal.                           Non-bleeding internal hemorrhoids were found during                            retroflexion. Complications:            No immediate complications. Estimated Blood Loss:     Estimated blood loss: none. Impression:               - The examined portion of the ileum was normal.                           - Non-bleeding internal hemorrhoids.                           - No specimens collected. Recommendation:           - Discharge patient to home (with escort).                           - Repeat colonoscopy in 10 years for colon cancer  screening.                           - If rectal bleeding recurs, then could consider                            hemorrhoidal banding.                           - The findings and recommendations were discussed                            with the patient. Dr Estefana Federico Rosario Estefana Federico,  06/16/2024 2:01:24 PM

## 2024-06-16 NOTE — Patient Instructions (Signed)
 Awaiting pathology results. Pantoprazole  20 mg daily for 8 weeks to allow for healing of gastric erosions. Avoids NSAIDS. Repeat colonoscopy in 10 years for colon cancer screening. If rectal bleeding recurs, then could consider hemorrhoidal banding. Handouts provided on hemorrhoids.  YOU HAD AN ENDOSCOPIC PROCEDURE TODAY AT THE [facility]:   Refer to the procedure report that was given to you for any specific questions about what was found during the examination.  If the procedure report does not answer your questions, please call your gastroenterologist to clarify.  If you requested that your care partner not be given the details of your procedure findings, then the procedure report has been included in a sealed envelope for you to review at your convenience later.  YOU SHOULD EXPECT: Some feelings of bloating in the abdomen. Passage of more gas than usual.  Walking can help get rid of the air that was put into your GI tract during the procedure and reduce the bloating. If you had a lower endoscopy (such as a colonoscopy or flexible sigmoidoscopy) you may notice spotting of blood in your stool or on the toilet paper. If you underwent a bowel prep for your procedure, you may not have a normal bowel movement for a few days.  Please Note:  You might notice some irritation and congestion in your nose or some drainage.  This is from the oxygen used during your procedure.  There is no need for concern and it should clear up in a day or so.  SYMPTOMS TO REPORT IMMEDIATELY:  Following lower endoscopy (colonoscopy or flexible sigmoidoscopy):  Excessive amounts of blood in the stool  Significant tenderness or worsening of abdominal pains  Swelling of the abdomen that is new, acute  Fever of 100F or higher  Following upper endoscopy (EGD)  Vomiting of blood or coffee ground material  New chest pain or pain under the shoulder blades  Painful or persistently difficult swallowing  New  shortness of breath  Fever of 100F or higher  Black, tarry-looking stools  For urgent or emergent issues, a gastroenterologist can be reached at any hour by calling [phone]. Do not use MyChart messaging for urgent concerns.    DIET:  We do recommend a small meal at first, but then you may proceed to your regular diet.  Drink plenty of fluids but you should avoid alcoholic beverages for 24 hours.  ACTIVITY:  You should plan to take it easy for the rest of today and you should NOT DRIVE or use heavy machinery until tomorrow (because of the sedation medicines used during the test).    FOLLOW UP: Our staff will call the number listed on your records the next business day following your procedure.  We will call around 7:15- 8:00 am to check on you and address any questions or concerns that you may have regarding the information given to you following your procedure. If we do not reach you, we will leave a message.     If any biopsies were taken you will be contacted by phone or by letter within the next 1-3 weeks.  Please call us  at [phone] if you have not heard about the biopsies in 3 weeks.    SIGNATURES/CONFIDENTIALITY: You and/or your care partner have signed paperwork which will be entered into your electronic medical record.  These signatures attest to the fact that that the information above on your After Visit Summary has been reviewed and is understood.  Full responsibility of the  confidentiality of this discharge information lies with you and/or your care-partner.

## 2024-06-16 NOTE — Progress Notes (Signed)
 Called to room to assist during endoscopic procedure.  Patient ID and intended procedure confirmed with present staff. Received instructions for my participation in the procedure from the performing physician.

## 2024-06-16 NOTE — Op Note (Addendum)
 [facility] Patient Name: Jennifer Peters Procedure Date: 06/16/2024 1:08 PM MRN: [id number] Endoscopist: Rosario Estefana Kidney , , [phone] Age: 34 Referring MD:  Date of Birth: May 19, 1991 Gender: Female Account #: [id number] Procedure:                Upper GI endoscopy Indications:              Iron deficiency anemia Medicines:                Monitored Anesthesia Care Procedure:                Pre-Anesthesia Assessment:                           - Prior to the procedure, a History and Physical                            was performed, and patient medications and                            allergies were reviewed. The patient's tolerance of                            previous anesthesia was also reviewed. The risks                            and benefits of the procedure and the sedation                            options and risks were discussed with the patient.                            All questions were answered, and informed consent                            was obtained. Prior Anticoagulants: The patient has                            taken no anticoagulant or antiplatelet agents. ASA                            Grade Assessment: I - A normal, healthy patient.                            After reviewing the risks and benefits, the patient                            was deemed in satisfactory condition to undergo the                            procedure.                           After obtaining informed consent, the endoscope was  passed under direct vision. Throughout the                            procedure, the patient's blood pressure, pulse, and                            oxygen saturations were monitored continuously. The                            Olympus Scope SN [id number] was introduced through the                            mouth, and advanced to the second part of duodenum.                            The upper GI endoscopy was  accomplished without                            difficulty. The patient tolerated the procedure                            well. Scope In: Scope Out: Findings:                 The examined esophagus was normal.                           Multiple localized erosions with no bleeding and no                            stigmata of recent bleeding were found in the                            gastric fundus. Biopsies were taken with a cold                            forceps for histology.                           The examined duodenum was normal. Biopsies were                            taken with a cold forceps for histology. Complications:            No immediate complications. Estimated Blood Loss:     Estimated blood loss was minimal. Impression:               - Normal esophagus.                           - Erosive gastropathy with no bleeding and no                            stigmata of recent bleeding. Biopsied.                           -  Normal examined duodenum. Biopsied. Recommendation:           - Await pathology results.                           - Pantoprazole  20 mg daily for 8 weeks to allow for                            healing of gastric erosions.                           - Avoid NSAIDs.                           - Perform a colonoscopy today. Dr Estefana Federico Rosario Estefana Federico,  06/16/2024 1:57:27 PM

## 2024-06-16 NOTE — Progress Notes (Signed)
 Report to PACU, RN, vss, BBS= Clear.

## 2024-06-16 NOTE — Progress Notes (Signed)
 "   GASTROENTEROLOGY PROCEDURE H&P NOTE   Primary Care Physician: Kip Ade, NP    Reason for Procedure:   Hematochezia, iron deficiency anemia  Plan:    EGD/colonoscopy  Patient is appropriate for endoscopic procedure(s) in the ambulatory ([facility]) setting.  The nature of the procedure, as well as the risks, benefits, and alternatives were carefully and thoroughly reviewed with the patient. Ample time for discussion and questions allowed. The patient understood, was satisfied, and agreed to proceed.     HPI: Jennifer Peters is a 34 y.o. female who presents for EGD/colonoscopy for evaluation of hematochezia and IDA.  Patient was most recently seen in the Gastroenterology Clinic on 05/25/24.  No interval change in medical history since that appointment. Patient does not describe any heavy menstrual periods. Please refer to that note for full details regarding GI history and clinical presentation.   Past Medical History:  Diagnosis Date   Abnormal Pap smear of cervix    unknown stage of abnormality   Anemia    Hx of chlamydia infection    Low vitamin D  level 05/10/2016   Post partum depression     Past Surgical History:  Procedure Laterality Date   APPENDECTOMY     CESAREAN SECTION N/A 09/25/2016   Procedure: CESAREAN SECTION;  Surgeon: Lynwood KANDICE Solomons, MD;  Location: [facility] BIR[facility]ING SU[facility];  Service: Obstetrics;  Laterality: N/A;   DILATION AND CURETTAGE OF UTERUS     LAPAROSCOPIC APPENDECTOMY  06/14/2012   Procedure: APPENDECTOMY LAPAROSCOPIC;  Surgeon: Debby LABOR. Cornett, MD;  Location: [facility] OR;  Service: General;  Laterality: N/A;   wisdom tooth extracted x1 in 04/2011      Prior to Admission medications  Medication Sig Start Date End Date Taking? Authorizing Provider  acetaminophen  (TYLENOL ) 500 MG tablet Take 500 mg by mouth every 6 (six) hours as needed for moderate pain.    [provider]  dicyclomine  (BENTYL ) 20 MG tablet Take 1 tablet (20 mg total) by mouth 4  (four) times daily as needed (intestinal cramps). 01/10/24   Vonna Sharlet POUR, MD  Drospirenone  (SLYND ) 4 MG TABS Take 1 tablet by mouth daily. 12/27/21   Constant, Peggy, MD  hydrocortisone  (ANUSOL -HC) 2.5 % rectal cream Place 1 Application rectally 2 (two) times daily. 05/25/24   May, Deanna J, NP  Multiple Vitamin (MULTIVITAMIN PO) Take by mouth.    [provider]  Na Sulfate-K Sulfate-Mg Sulfate concentrate (SUPREP) 17.5-3.13-1.6 GM/177ML SOLN TAKE 1 KIT BY MOUTH FOR 1 DOSE 06/15/24   May, Deanna J, NP  UNKNOWN TO PATIENT Stool softener    [provider]    Current Outpatient Medications  Medication Sig Dispense Refill   dicyclomine  (BENTYL ) 20 MG tablet Take 1 tablet (20 mg total) by mouth 4 (four) times daily as needed (intestinal cramps). 20 tablet 0   hydrocortisone  (ANUSOL -HC) 2.5 % rectal cream Place 1 Application rectally 2 (two) times daily. 30 g 1   Multiple Vitamin (MULTIVITAMIN PO) Take by mouth.     Na Sulfate-K Sulfate-Mg Sulfate concentrate (SUPREP) 17.5-3.13-1.6 GM/177ML SOLN TAKE 1 KIT BY MOUTH FOR 1 DOSE 354 mL 0   UNKNOWN TO PATIENT Stool softener     Current Facility-Administered Medications  Medication Dose Route Frequency Provider Last Rate Last Admin   0.9 %  sodium chloride  infusion  500 mL Intravenous Continuous Federico Rosario BROCKS, MD        Allergies as of 06/16/2024 - Review Complete 06/16/2024  Allergen Reaction Noted   Latex  Anaphylaxis, Itching, and Rash 02/18/2015    Family History  Problem Relation Age of Onset   Hypertension Father    Kidney disease Maternal Grandmother    Heart disease Maternal Grandmother        chf   Diabetes Maternal Aunt    Other Neg Hx     Social History   Socioeconomic History   Marital status: Single    Spouse name: Elsie Debby Raddle. [place]   Number of children: 3   Years of education: 12   Highest education level: Not on file  Occupational History   Occupation: Conservation Officer, [employer]    Comment: [employer]    Occupation: FOB    Comment: unemployed  Tobacco Use   Smoking status: Former    Current packs/day: 0.00    Average packs/day: 0.5 packs/day for 3.0 years (1.5 ttl pk-yrs)    Types: Cigarettes    Start date: 08/20/2008    Quit date: 08/21/2011    Years since quitting: 12.8   Smokeless tobacco: Never  Vaping Use   Vaping status: Never Used  Substance and Sexual Activity   Alcohol use: Yes    Comment: occasionally   Drug use: No   Sexual activity: Yes    Partners: Male    Birth control/protection: Condom  Other Topics Concern   Not on file  Social History Narrative   Pt is single. FOB is age 78.    Social Drivers of Health   Tobacco Use: Medium Risk (06/16/2024)   Patient History    Smoking Tobacco Use: Former    Smokeless Tobacco Use: Never    Passive Exposure: Not on Act[place] Strain: Not on file  Food Insecurity: Not on file  Transportation Needs: Not on file  Physical Activity: Not on file  Stress: Not on file  Social Connections: Not on file  Intimate Partner Violence: Not on file  Depression (PHQ2-9): Low Risk (12/27/2021)   Depression (PHQ2-9)    PHQ-2 Score: 0  Alcohol Screen: Not on file  Housing: Not on file  Utilities: Not on file  Health Literacy: Not on file    Physical Exam: Vital signs in last 24 hours: BP 115/71   Pulse 91   Temp 97.9 F (36.6 C)   Ht 5' 2 (1.575 m)   Wt 133 lb 9.6 oz (60.6 kg)   SpO2 100%   BMI 24.44 kg/m  GEN: NAD EYE: Sclerae anicteric ENT: MMM CV: Non-tachycardic Pulm: No increased WOB GI: Soft NEURO:  Alert & Oriented   Estefana Kidney, MD [facility]   06/16/2024 12:44 PM  "

## 2024-06-16 NOTE — Progress Notes (Signed)
 Pt's states no medical or surgical changes since previsit or office visit.

## 2024-06-17 ENCOUNTER — Telehealth: Payer: Self-pay

## 2024-06-17 NOTE — Telephone Encounter (Signed)
" °  Follow up Call-     06/16/2024   12:37 PM  Call back number  Post procedure Call Back phone  # [phone]  Permission to leave phone message Yes     Patient questions:  Do you have a fever, pain , or abdominal swelling? No. Pain Score  0 *  Have you tolerated food without any problems? Yes.    Have you been able to return to your normal activities? Yes.    Do you have any questions about your discharge instructions: Diet   No. Medications  No. Follow up visit  No.  Do you have questions or concerns about your Care? No.  Actions: * If pain score is 4 or above: No action needed, pain <4.   "

## 2024-06-19 LAB — SURGICAL PATHOLOGY

## 2024-06-24 ENCOUNTER — Ambulatory Visit: Payer: Self-pay | Admitting: Internal Medicine

## 2024-06-24 NOTE — Progress Notes (Signed)
 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 Recommend bismuth quadruple therapy for treatment: - Tetracycline 500 mg QID x 14 days - Flagyl  250 mg QID x 14 days - Bismuth subsalicylate 524 mg QID x 14 days - PPI BID x 14 days  Let's plan for a GI clinic follow up in 2 months to test for eradication of H pylori.

## 2024-06-25 ENCOUNTER — Other Ambulatory Visit: Payer: Self-pay

## 2024-06-25 MED ORDER — BISMUTH SUBSALICYLATE 262 MG PO CHEW: 524.0000 mg | CHEWABLE_TABLET | Freq: Four times a day (QID) | ORAL | 0 refills | Status: AC

## 2024-06-25 MED ORDER — METRONIDAZOLE 250 MG PO TABS: 250.0000 mg | ORAL_TABLET | Freq: Four times a day (QID) | ORAL | 0 refills | Status: AC

## 2024-06-25 MED ORDER — TETRACYCLINE HCL 500 MG PO CAPS: 500.0000 mg | ORAL_CAPSULE | Freq: Four times a day (QID) | ORAL | 0 refills | Status: AC

## 2024-07-01 ENCOUNTER — Telehealth: Payer: Self-pay | Admitting: Pediatrics

## 2024-07-01 ENCOUNTER — Encounter: Payer: Self-pay | Admitting: Family Medicine

## 2024-07-01 ENCOUNTER — Telehealth: Admitting: Family Medicine

## 2024-07-01 ENCOUNTER — Telehealth: Payer: Self-pay | Admitting: Internal Medicine

## 2024-07-01 DIAGNOSIS — A048 Other specified bacterial intestinal infections: Secondary | ICD-10-CM

## 2024-07-01 NOTE — Telephone Encounter (Signed)
 PT Is calling to have a PA for tetracycline . She stated her pharmacy will not fill it at all. She is also asking to speak with a nurse regarding her biopsy. She stated that she needs a note to be on medical leave and it was because she was pregnant when done and now having issues sent then that need intensive medical care. Please advise.

## 2024-07-01 NOTE — Patient Instructions (Signed)
" °  Hadassah Clarity, thank you for joining Roosvelt Mater, PA-C for today's virtual visit.  While this provider is not your primary care provider (PCP), if your PCP is located in our provider database this encounter information will be shared with them immediately following your visit.   A [facility] MyChart account gives you access to today's visit and all your visits, tests, and labs performed at [facility]  click here if you don't have a [facility] MyChart account or go to [email or web address]  Consent: (Patient) Jennifer Peters provided verbal consent for this virtual visit at the beginning of the encounter.  Current Medications:  Current Outpatient Medications:    bismuth  subsalicylate (PEPTO BISMOL) 262 MG chewable tablet, Chew 2 tablets (524 mg total) by mouth in the morning, at noon, in the evening, and at bedtime for 14 days., Disp: 112 tablet, Rfl: 0   dicyclomine  (BENTYL ) 20 MG tablet, Take 1 tablet (20 mg total) by mouth 4 (four) times daily as needed (intestinal cramps)., Disp: 20 tablet, Rfl: 0   hydrocortisone  (ANUSOL -HC) 2.5 % rectal cream, Place 1 Application rectally 2 (two) times daily., Disp: 30 g, Rfl: 1   metroNIDAZOLE  (FLAGYL ) 250 MG tablet, Take 1 tablet (250 mg total) by mouth 4 (four) times daily for 14 days., Disp: 56 tablet, Rfl: 0   Multiple Vitamin (MULTIVITAMIN PO), Take by mouth., Disp: , Rfl:    pantoprazole  (PROTONIX ) 20 MG tablet, Take 1 tablet (20 mg total) by mouth daily., Disp: 56 tablet, Rfl: 0   tetracycline  (SUMYCIN ) 500 MG capsule, Take 1 capsule (500 mg total) by mouth 4 (four) times daily for 14 days., Disp: 56 capsule, Rfl: 0   UNKNOWN TO PATIENT, Stool softener, Disp: , Rfl:    Medications ordered in this encounter:  No orders of the defined types were placed in this encounter.    *If you need refills on other medications prior to your next appointment, please contact your pharmacy*  Follow-Up: Call back or seek an in-person  evaluation if the symptoms worsen or if the condition fails to improve as anticipated.  [facility] [phone]  Other Instructions    If you have been instructed to have an in-person evaluation today at a local Urgent Care facility, please use the link below. It will take you to a list of all of our available [facility], including address, phone number and hours of operation. Please do not delay care.  [facility]  If you or a family member do not have a primary care provider, use the link below to schedule a visit and establish care. When you choose a [facility] primary care physician or advanced practice provider, you gain a long-term partner in health. Find a Primary Care Provider  Learn more about [facility]'s in-office and virtual care options:  - Get Care Now  "

## 2024-07-01 NOTE — Telephone Encounter (Signed)
 Contacted on-call by Ms. Mcgeehan reporting symptoms of abdominal cramping in the setting of taking pantoprazole  and recent positive pregnancy test.  Underwent EGD earlier this month and diagnosed with H. Pylori.  Prescribed BQT but has not started antibiotics or bismuth .  Has been taking pantoprazole .  Describes sensations of abdominal cramping that prompted her to do a pregnancy test which was positive.  She is not sure when she may have become pregnant.  Relates that she anticipates terminating pregnancy and is scheduling an OB appointment.  Has questions as to whether or not H. pylori treatment is safe in the setting of drugs that may be used for medical termination.  She also queries if PPI may be contributing to her abdominal pain.  Reviewed that multiple factors could be contributing to her abdominal cramping: H. pylori, PPI, pregnancy.  Advised that she can continue taking pantoprazole  if she would like as it is safe and compatible with pregnancy should she change her mind regarding termination.  Recommended holding off on starting H. pylori treatment until she is able to discuss further with Dr. Federico.  May recommend waiting on H. pylori treatment until after medical termination.  She can discuss with OB compatibility of H. pylori drugs with medications used in the setting of termination.  Will forward message Dr. Federico for review and follow-up with patient.

## 2024-07-01 NOTE — Progress Notes (Signed)
 " Virtual Visit Consent   Jennifer Peters, you are scheduled for a virtual visit with a [facility] provider today. Just as with appointments in the office, your consent must be obtained to participate. Your consent will be active for this visit and any virtual visit you may have with one of our providers in the next 365 days. If you have a MyChart account, a copy of this consent can be sent to you electronically.  As this is a virtual visit, video technology does not allow for your provider to perform a traditional examination. This may limit your provider's ability to fully assess your condition. If your provider identifies any concerns that need to be evaluated in person or the need to arrange testing (such as labs, EKG, etc.), we will make arrangements to do so. Although advances in technology are sophisticated, we cannot ensure that it will always work on either your end or our end. If the connection with a video visit is poor, the visit may have to be switched to a telephone visit. With either a video or telephone visit, we are not always able to ensure that we have a secure connection.  By engaging in this virtual visit, you consent to the provision of healthcare and authorize for your insurance to be billed (if applicable) for the services provided during this visit. Depending on your insurance coverage, you may receive a charge related to this service.  I need to obtain your verbal consent now. Are you willing to proceed with your visit today? Jennifer Peters has provided verbal consent on 07/01/2024 for a virtual visit (video or telephone). Roosvelt Mater, [place]  Date: 07/01/2024 7:51 PM   Virtual Visit via Video Note   I, Roosvelt Mater, connected with  Jennifer Peters  ([id number], 14-Jan-1991) on 07/01/24 at  7:45 PM EST by a video-enabled telemedicine application and verified that I am speaking with the correct person using two identifiers.  Location: Patient: Virtual Visit Location Patient:  Home Provider: Virtual Visit Location Provider: Home Office   I discussed the limitations of evaluation and management by telemedicine and the availability of in person appointments. The patient expressed understanding and agreed to proceed.    History of Present Illness: Jennifer Peters is a 34 y.o. who identifies as a female who was assigned female at birth, and is being seen today for c/o recently had a biopsy on her stomach and was diagnosed with H. pylori and is on a lot of medications. Pt states she took a pregnancy test and was positive. Pt is wanting to know what her next steps are. Pt also requests a note for work, as the news of being pregnant is a lot and would like a couple days to settle her affairs.   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  Resting comfortably  at home.  Head is normocephalic, atraumatic.  No labored breathing.  Speech is clear and coherent with logical content.  Patient is alert and oriented at baseline.    Assessment and Plan: 1. H. pylori infection (Primary)  -Pt advised to stop medications and follow up with GI and PCP for further evaluation and on going care -Pt verbalized understanding   Follow Up Instructions: I discussed the assessment and treatment plan with the patient. The patient  was provided an opportunity to ask questions and all were answered. The patient agreed with the plan and demonstrated an understanding of the instructions.  A copy of instructions were sent to the patient via MyChart unless otherwise noted below.    The patient was advised to call back or seek an in-person evaluation if the symptoms worsen or if the condition fails to improve as anticipated.    Roosvelt Mater, PA-C    [1]  Allergies Allergen Reactions   Latex Anaphylaxis, Itching and Rash  [2]  Current Outpatient Medications:    bismuth  subsalicylate (PEPTO BISMOL) 262 MG chewable tablet, Chew 2 tablets (524 mg total) by mouth in the morning, at noon, in the evening, and at bedtime for 14 days., Disp: 112 tablet, Rfl: 0   dicyclomine  (BENTYL ) 20 MG tablet, Take 1 tablet (20 mg total) by mouth 4 (four) times daily as needed (intestinal cramps)., Disp: 20 tablet, Rfl: 0   hydrocortisone  (ANUSOL -HC) 2.5 % rectal cream, Place 1 Application rectally 2 (two) times daily., Disp: 30 g, Rfl: 1   metroNIDAZOLE  (FLAGYL ) 250 MG tablet, Take 1 tablet (250 mg total) by mouth 4 (four) times daily for 14 days., Disp: 56 tablet, Rfl: 0   Multiple Vitamin (MULTIVITAMIN PO), Take by mouth., Disp: , Rfl:    pantoprazole  (PROTONIX ) 20 MG tablet, Take 1 tablet (20 mg total) by mouth daily., Disp: 56 tablet, Rfl: 0   tetracycline  (SUMYCIN ) 500 MG capsule, Take 1 capsule (500 mg total) by mouth 4 (four) times daily for 14 days., Disp: 56 capsule, Rfl: 0   UNKNOWN TO PATIENT, Stool softener, Disp: , Rfl:   "

## 2024-07-02 NOTE — Telephone Encounter (Signed)
 FMLA forms placed in Dr. Lafonda box for review.

## 2024-07-02 NOTE — Telephone Encounter (Signed)
 Patient called in overnight. Refer to separate phone message from yesterday.

## 2024-07-02 NOTE — Telephone Encounter (Signed)
 Discussed with Dr. Federico patient concerns & FMLA forms, and we are unable to fill these out at this time. Patient has been made aware & advised to discuss with OB/GYN tomorrow at her appointment. Pt verbalized all understanding.

## 2024-07-02 NOTE — Telephone Encounter (Signed)
 Patient came by the office to discuss her symptoms, upcoming pregnancy termination, and FMLA forms. She is requesting FMLA until 2/11 due to LLQ abdominal cramping & migraine that started when she began pantoprazole . PCP has advised her to reach out to us  to fill out forms. She has not started the other medications d/t being advised to hold off d/t pregnancy. She plans to meet with OB tomorrow to discuss termination. Patient is very emotional. Emotional support provided to patient & advised her I would have Dr. Federico review and then we will be in touch.

## 2024-07-08 ENCOUNTER — Ambulatory Visit: Payer: Self-pay

## 2024-07-08 VITALS — BP 111/74 | HR 68 | Wt 136.4 lb

## 2024-07-08 DIAGNOSIS — Z3200 Encounter for pregnancy test, result unknown: Secondary | ICD-10-CM

## 2024-07-08 LAB — POCT URINE PREGNANCY: Preg Test, Ur: POSITIVE — AB

## 2024-07-08 NOTE — Progress Notes (Signed)
..  Jennifer Peters presents today for UPT. She reports that she is scheduled to terminate the pregnancy at [facility] on 07/15/24 and is only here today to see if she can get FMLA to be written out for the procedure. The patient states that her job does not accept letters.  LMP:05/18/24    OBJECTIVE: Appears well, in no apparent distress.  OB History     Gravida  5   Para  3   Term  2   Preterm  1   AB  1   Living  3      SAB  1   IAB  0   Ectopic  0   Multiple  0   Live Births  3          Home UPT Result:Positive In-Office UPT result:Positive I have reviewed the patient's medical, obstetrical, social, and family histories, and medications.   ASSESSMENT: Positive pregnancy test  PLAN Prenatal care to be completed at: N/A Consulted with the provider in office and advised patient that we cannot provide FMLA documents for a procedure that our office is not performing. Advised patient to follow up with the clinic that she is scheduled with to see if paperwork can be completed. Pt voiced understanding.

## 2024-07-09 ENCOUNTER — Other Ambulatory Visit (HOSPITAL_COMMUNITY): Payer: Self-pay

## 2024-08-25 ENCOUNTER — Ambulatory Visit: Admitting: Gastroenterology
